# Patient Record
Sex: Female | Born: 1965
Health system: Southern US, Community
[De-identification: ages and names within clinical notes are randomized; demographics above are authoritative.]

## PROBLEM LIST (undated history)

## (undated) DIAGNOSIS — F3289 Other specified depressive episodes: Secondary | ICD-10-CM

## (undated) DIAGNOSIS — E785 Hyperlipidemia, unspecified: Secondary | ICD-10-CM

## (undated) DIAGNOSIS — G43909 Migraine, unspecified, not intractable, without status migrainosus: Secondary | ICD-10-CM

## (undated) DIAGNOSIS — I82409 Acute embolism and thrombosis of unspecified deep veins of unspecified lower extremity: Secondary | ICD-10-CM

## (undated) DIAGNOSIS — G4700 Insomnia, unspecified: Secondary | ICD-10-CM

## (undated) DIAGNOSIS — K219 Gastro-esophageal reflux disease without esophagitis: Secondary | ICD-10-CM

## (undated) DIAGNOSIS — G4733 Obstructive sleep apnea (adult) (pediatric): Secondary | ICD-10-CM

## (undated) DIAGNOSIS — R030 Elevated blood-pressure reading, without diagnosis of hypertension: Secondary | ICD-10-CM

## (undated) DIAGNOSIS — I2699 Other pulmonary embolism without acute cor pulmonale: Secondary | ICD-10-CM

## (undated) DIAGNOSIS — J309 Allergic rhinitis, unspecified: Secondary | ICD-10-CM

## (undated) DIAGNOSIS — G894 Chronic pain syndrome: Secondary | ICD-10-CM

## (undated) DIAGNOSIS — F329 Major depressive disorder, single episode, unspecified: Secondary | ICD-10-CM

## (undated) DIAGNOSIS — K589 Irritable bowel syndrome without diarrhea: Secondary | ICD-10-CM

## (undated) DIAGNOSIS — D6859 Other primary thrombophilia: Secondary | ICD-10-CM

## (undated) HISTORY — DX: Elevated blood-pressure reading, without diagnosis of hypertension: R03.0

## (undated) HISTORY — PX: NASAL SINUS SURGERY: SHX719

## (undated) HISTORY — DX: Allergic rhinitis, unspecified: J30.9

## (undated) HISTORY — DX: Insomnia, unspecified: G47.00

## (undated) HISTORY — DX: Chronic pain syndrome: G89.4

## (undated) HISTORY — PX: OTHER SURGICAL HISTORY: SHX169

## (undated) HISTORY — PX: CARPAL TUNNEL RELEASE: SHX101

## (undated) HISTORY — DX: Other specified depressive episodes: F32.89

## (undated) HISTORY — DX: Migraine, unspecified, not intractable, without status migrainosus: G43.909

## (undated) HISTORY — DX: Other primary thrombophilia: D68.59

## (undated) HISTORY — DX: Major depressive disorder, single episode, unspecified: F32.9

## (undated) HISTORY — DX: Obstructive sleep apnea (adult) (pediatric): G47.33

## (undated) HISTORY — DX: Irritable bowel syndrome, unspecified: K58.9

## (undated) HISTORY — DX: Hyperlipidemia, unspecified: E78.5

## (undated) HISTORY — DX: Gastro-esophageal reflux disease without esophagitis: K21.9

---

## 1998-10-12 ENCOUNTER — Ambulatory Visit (HOSPITAL_BASED_OUTPATIENT_CLINIC_OR_DEPARTMENT_OTHER): Admission: RE | Admit: 1998-10-12 | Discharge: 1998-10-12 | Payer: Self-pay | Admitting: *Deleted

## 1999-01-31 ENCOUNTER — Encounter: Admission: RE | Admit: 1999-01-31 | Discharge: 1999-01-31 | Payer: Self-pay | Admitting: Obstetrics & Gynecology

## 1999-01-31 ENCOUNTER — Encounter: Payer: Self-pay | Admitting: Obstetrics & Gynecology

## 1999-02-02 ENCOUNTER — Other Ambulatory Visit: Admission: RE | Admit: 1999-02-02 | Discharge: 1999-02-02 | Payer: Self-pay | Admitting: Obstetrics & Gynecology

## 1999-02-27 ENCOUNTER — Encounter: Payer: Self-pay | Admitting: Obstetrics & Gynecology

## 1999-02-27 ENCOUNTER — Ambulatory Visit (HOSPITAL_COMMUNITY): Admission: RE | Admit: 1999-02-27 | Discharge: 1999-02-27 | Payer: Self-pay | Admitting: Obstetrics & Gynecology

## 1999-04-29 ENCOUNTER — Inpatient Hospital Stay (HOSPITAL_COMMUNITY): Admission: AD | Admit: 1999-04-29 | Discharge: 1999-04-29 | Payer: Self-pay | Admitting: Obstetrics and Gynecology

## 1999-05-02 ENCOUNTER — Ambulatory Visit (HOSPITAL_COMMUNITY): Admission: RE | Admit: 1999-05-02 | Discharge: 1999-05-02 | Payer: Self-pay | Admitting: Obstetrics and Gynecology

## 1999-05-02 ENCOUNTER — Encounter: Payer: Self-pay | Admitting: Obstetrics and Gynecology

## 1999-05-17 ENCOUNTER — Inpatient Hospital Stay (HOSPITAL_COMMUNITY): Admission: AD | Admit: 1999-05-17 | Discharge: 1999-05-17 | Payer: Self-pay | Admitting: Obstetrics & Gynecology

## 1999-08-30 ENCOUNTER — Ambulatory Visit (HOSPITAL_COMMUNITY): Admission: RE | Admit: 1999-08-30 | Discharge: 1999-08-30 | Payer: Self-pay | Admitting: Obstetrics & Gynecology

## 1999-11-20 ENCOUNTER — Inpatient Hospital Stay (HOSPITAL_COMMUNITY): Admission: AD | Admit: 1999-11-20 | Discharge: 1999-11-23 | Payer: Self-pay | Admitting: Obstetrics and Gynecology

## 1999-11-24 ENCOUNTER — Encounter: Admission: RE | Admit: 1999-11-24 | Discharge: 2000-01-03 | Payer: Self-pay | Admitting: Obstetrics and Gynecology

## 2000-03-26 ENCOUNTER — Other Ambulatory Visit: Admission: RE | Admit: 2000-03-26 | Discharge: 2000-03-26 | Payer: Self-pay | Admitting: Obstetrics & Gynecology

## 2000-12-11 ENCOUNTER — Encounter: Admission: RE | Admit: 2000-12-11 | Discharge: 2000-12-11 | Payer: Self-pay | Admitting: Family Medicine

## 2000-12-11 ENCOUNTER — Encounter: Payer: Self-pay | Admitting: Family Medicine

## 2003-06-09 ENCOUNTER — Encounter (INDEPENDENT_AMBULATORY_CARE_PROVIDER_SITE_OTHER): Payer: Self-pay | Admitting: *Deleted

## 2003-06-09 ENCOUNTER — Encounter: Admission: RE | Admit: 2003-06-09 | Discharge: 2003-06-09 | Payer: Self-pay | Admitting: Family Medicine

## 2006-08-16 ENCOUNTER — Encounter
Admission: RE | Admit: 2006-08-16 | Discharge: 2006-08-16 | Payer: Self-pay | Admitting: Physical Medicine and Rehabilitation

## 2006-10-28 ENCOUNTER — Encounter
Admission: RE | Admit: 2006-10-28 | Discharge: 2006-10-28 | Payer: Self-pay | Admitting: Physical Medicine & Rehabilitation

## 2008-07-21 DIAGNOSIS — R51 Headache: Secondary | ICD-10-CM | POA: Insufficient documentation

## 2008-07-21 DIAGNOSIS — R519 Headache, unspecified: Secondary | ICD-10-CM | POA: Insufficient documentation

## 2008-07-21 DIAGNOSIS — R197 Diarrhea, unspecified: Secondary | ICD-10-CM | POA: Insufficient documentation

## 2008-07-21 DIAGNOSIS — Z86718 Personal history of other venous thrombosis and embolism: Secondary | ICD-10-CM | POA: Insufficient documentation

## 2008-07-21 DIAGNOSIS — M2550 Pain in unspecified joint: Secondary | ICD-10-CM | POA: Insufficient documentation

## 2008-07-21 DIAGNOSIS — K589 Irritable bowel syndrome without diarrhea: Secondary | ICD-10-CM | POA: Insufficient documentation

## 2008-07-21 DIAGNOSIS — M722 Plantar fascial fibromatosis: Secondary | ICD-10-CM | POA: Insufficient documentation

## 2008-07-21 DIAGNOSIS — G4700 Insomnia, unspecified: Secondary | ICD-10-CM

## 2008-07-21 DIAGNOSIS — R5381 Other malaise: Secondary | ICD-10-CM

## 2008-07-21 DIAGNOSIS — K219 Gastro-esophageal reflux disease without esophagitis: Secondary | ICD-10-CM | POA: Insufficient documentation

## 2008-07-21 DIAGNOSIS — M25549 Pain in joints of unspecified hand: Secondary | ICD-10-CM | POA: Insufficient documentation

## 2008-07-21 DIAGNOSIS — IMO0001 Reserved for inherently not codable concepts without codable children: Secondary | ICD-10-CM | POA: Insufficient documentation

## 2008-07-21 DIAGNOSIS — E538 Deficiency of other specified B group vitamins: Secondary | ICD-10-CM

## 2008-07-21 DIAGNOSIS — G5600 Carpal tunnel syndrome, unspecified upper limb: Secondary | ICD-10-CM | POA: Insufficient documentation

## 2008-07-21 DIAGNOSIS — G894 Chronic pain syndrome: Secondary | ICD-10-CM | POA: Insufficient documentation

## 2008-07-21 DIAGNOSIS — D6859 Other primary thrombophilia: Secondary | ICD-10-CM | POA: Insufficient documentation

## 2008-07-21 DIAGNOSIS — R5383 Other fatigue: Secondary | ICD-10-CM

## 2008-07-21 DIAGNOSIS — R030 Elevated blood-pressure reading, without diagnosis of hypertension: Secondary | ICD-10-CM | POA: Insufficient documentation

## 2008-07-21 DIAGNOSIS — G43909 Migraine, unspecified, not intractable, without status migrainosus: Secondary | ICD-10-CM

## 2008-07-21 DIAGNOSIS — F329 Major depressive disorder, single episode, unspecified: Secondary | ICD-10-CM

## 2008-07-21 DIAGNOSIS — G4733 Obstructive sleep apnea (adult) (pediatric): Secondary | ICD-10-CM | POA: Insufficient documentation

## 2008-07-21 DIAGNOSIS — R609 Edema, unspecified: Secondary | ICD-10-CM | POA: Insufficient documentation

## 2008-07-21 DIAGNOSIS — F32A Depression, unspecified: Secondary | ICD-10-CM

## 2008-07-21 DIAGNOSIS — J309 Allergic rhinitis, unspecified: Secondary | ICD-10-CM | POA: Insufficient documentation

## 2008-07-21 DIAGNOSIS — E559 Vitamin D deficiency, unspecified: Secondary | ICD-10-CM

## 2008-07-21 DIAGNOSIS — E785 Hyperlipidemia, unspecified: Secondary | ICD-10-CM

## 2008-07-21 HISTORY — DX: Deficiency of other specified B group vitamins: E53.8

## 2008-07-21 HISTORY — DX: Hyperlipidemia, unspecified: E78.5

## 2008-07-21 HISTORY — DX: Depression, unspecified: F32.A

## 2008-07-21 HISTORY — DX: Other malaise: R53.81

## 2008-07-21 HISTORY — DX: Vitamin D deficiency, unspecified: E55.9

## 2008-07-21 HISTORY — DX: Edema, unspecified: R60.9

## 2008-07-21 HISTORY — DX: Migraine, unspecified, not intractable, without status migrainosus: G43.909

## 2008-07-21 HISTORY — DX: Insomnia, unspecified: G47.00

## 2008-07-21 HISTORY — DX: Personal history of other venous thrombosis and embolism: Z86.718

## 2008-07-22 ENCOUNTER — Ambulatory Visit: Payer: Self-pay | Admitting: Gastroenterology

## 2008-10-20 ENCOUNTER — Encounter: Payer: Self-pay | Admitting: Gastroenterology

## 2008-11-17 ENCOUNTER — Ambulatory Visit: Payer: Self-pay | Admitting: Gastroenterology

## 2010-08-25 NOTE — Op Note (Signed)
[facility]  Patient:    Leslie Rice, Leslie Rice                       MRN: [id number] Proc. Date: 11/20/99 Attending:  Mickle Mallory                           Operative Report  PREOPERATIVE DIAGNOSES:       1. Intrauterine pregnancy at 39 weeks.                               2. Previous cesarean section.                               3. Refused vaginal birth after cesarean.                               4. Group B strep positive.  POSTOPERATIVE DIAGNOSES:      1. Intrauterine pregnancy at 39 weeks.                               2. Previous cesarean section.                               3. Refused vaginal birth after cesarean.                               4. Group B strep positive.  OPERATION:                    Repeat low transverse cesarean section.  SURGEON:                      Marcelle Overlie, M.D.  ASSISTANT:                    Caralyn Guile. Arlyce Dice, M.D.  ANESTHESIA:                   Epidural.  ESTIMATED BLOOD LOSS:         500 cc.  DRAINS:                       Foley.  COMPLICATIONS:                None.  FINDINGS:                     Female infant with Apgars of 9 at one minute and 9 at five minutes weighing 8 pounds 13 ounces.  COMPLICATIONS:                None.  DESCRIPTION OF PROCEDURE:     The patient was taken to the operating room where she was given a spinal for anesthesia without difficulty.  She was then prepped and draped in the usual sterile fashion.  A Foley catheter was also placed in the bladder.  Using a scalpel, a low transverse incision was made at the area of the previous incision and carried down to the fascia using electrocautery.  The  fascia was scored in the midline and extended laterally using Mayo scissors.  A Pfannenstiel incision was then created and the midline was identified and the rectus muscles were separated.  The peritoneum was picked up and entered bluntly.  The peritoneal incision was then  extended superiorly and then inferiorly with good visualization.  The bladder blade was inserted.  The lower uterine segment was identified.  The bladder flap was created sharply and then digitally and the bladder blade was then readjusted. Using a scalpel, a low transverse incision was made and then extended laterally. The amniotic fluid was noted to be clear upon entry into the uterine cavity.  The baby was in the cephalic presentation. It was a female and delivered quite easily with Apgars of 9 at one minute and 9 five minutes.  The cord was clamped and cut and the baby was handed to the awaiting pediatricians. Cord blood was obtained.  The placenta was manually removed and noted to be intact.  The uterus was cleared of all clots and debris.  The uterine incision was closed in a single layer using 0 chromic in a continuous running locked stitch and the peritoneum was closed in a single layer using 0 Vicryl in a continuous running stitch.  The fascia was then closed using 0 Vicryl in a continuous running stitch x 2 starting at each corner and meeting in the midline.  The subcutaneous layer was inspected and noted to be hemostatic.  The skin was then closed with staples. All sponge, needle and instrument counts were correct x 2.  The patient tolerated the procedure well and went to the recovery room in stable condition.  Complications none. DD:  11/20/99 TD:  11/20/99 Job: [id number]

## 2010-08-25 NOTE — Discharge Summary (Signed)
[facility]  Patient:    Leslie Rice, Leslie Rice                       MRN: [id number] Adm. Date:  [id number] Disc. Date: [id number] Attending:  Marcelle Overlie Dictator:   Leilani Able, P.A.                           Discharge Summary  FINAL DIAGNOSES:              1. Intrauterine pregnancy at [redacted] weeks gestation.                               2. History of previous cesarean section, refuses                                  vaginal birth after cesarean section.                               3. Group B strep positive.  PROCEDURES:                   Repeat low transverse cesarean section.  SURGEON:                      Dr. Marcelle Overlie.  ASSISTANT:                    Dr. Ilda Mori.  COMPLICATIONS:                None.  HISTORY OF PRESENT ILLNESS:   This 45 year old G2, P1, was admitted at [redacted] weeks gestation for repeat cesarean section.  The patient had a history of cesarean section in 1998, and refuses a trial of labor with this pregnancy.  HOSPITAL COURSE:              The patient was taken to the operating room by Dr. Marcelle Overlie where a repeat low transverse cesarean section was performed, with the delivery of an 8-pound 13-ounce female infant with Apgars of 9 and 9.  Delivery went without complication.  The patients postoperative course was complicated by some postoperative anemia.  The patient was started on FeSO4 325 mg 1 t.i.d.  The patient was felt ready for discharge on postoperative day #3.  The patient was Rh negative but did not receive RhoGAM because the baby was also Rh negative.  The patient was sent home on a regular diet, told to decrease activities, told to continue FeSO4 325 mg 1 t.i.d., was given Percocet 1-2 every four hours as needed for pain.  She was to follow up in the office on November 24, 1999, for staple removal, and then to follow up in four more weeks for her postoperative visit.DD:  01/12/00 TD:  01/13/00 Job:  [id number]

## 2012-02-29 ENCOUNTER — Ambulatory Visit (INDEPENDENT_AMBULATORY_CARE_PROVIDER_SITE_OTHER): Payer: 59 | Admitting: Internal Medicine

## 2012-02-29 ENCOUNTER — Encounter: Payer: Self-pay | Admitting: Internal Medicine

## 2012-02-29 VITALS — BP 114/76 | HR 85 | Temp 98.5°F | Ht 62.75 in | Wt 228.0 lb

## 2012-02-29 DIAGNOSIS — Z0000 Encounter for general adult medical examination without abnormal findings: Secondary | ICD-10-CM

## 2012-02-29 DIAGNOSIS — G4733 Obstructive sleep apnea (adult) (pediatric): Secondary | ICD-10-CM

## 2012-02-29 DIAGNOSIS — F3289 Other specified depressive episodes: Secondary | ICD-10-CM

## 2012-02-29 DIAGNOSIS — G43909 Migraine, unspecified, not intractable, without status migrainosus: Secondary | ICD-10-CM

## 2012-02-29 DIAGNOSIS — G894 Chronic pain syndrome: Secondary | ICD-10-CM

## 2012-02-29 DIAGNOSIS — K219 Gastro-esophageal reflux disease without esophagitis: Secondary | ICD-10-CM

## 2012-02-29 DIAGNOSIS — D6859 Other primary thrombophilia: Secondary | ICD-10-CM

## 2012-02-29 DIAGNOSIS — E785 Hyperlipidemia, unspecified: Secondary | ICD-10-CM

## 2012-02-29 DIAGNOSIS — F329 Major depressive disorder, single episode, unspecified: Secondary | ICD-10-CM

## 2012-02-29 LAB — POCT INR: INR: 1.3

## 2012-02-29 MED ORDER — SERTRALINE HCL 100 MG PO TABS: 200.0000 mg | ORAL_TABLET | Freq: Every day | ORAL | Status: DC

## 2012-02-29 NOTE — Assessment & Plan Note (Signed)
On Zoloft and Lamictal, reports excellent results to Lamictal. Medication is usually refill by PCP

## 2012-02-29 NOTE — Patient Instructions (Addendum)
Coumadin 5 mg: One tablet every day Next INRt in 2 weeks please make an appointment ----- Please get records from your previous doctor: EKGs, colonoscopies, EGDs, echocardiograms, XRs. Also labs from the last year Immunization records --- Next visit to see me in 2 months

## 2012-02-29 NOTE — Assessment & Plan Note (Signed)
In the past, simvastatin caused pain. She took Pravachol up to a month ago ---> self d/c due to pain (despite taking pain medication)

## 2012-02-29 NOTE — Assessment & Plan Note (Signed)
Used to see Dr Shaune Spittle to see Dr Verlan Friends [phone]

## 2012-02-29 NOTE — Assessment & Plan Note (Signed)
Last INR 6 weeks ago was too high, they decrease her dose. Currently taking 5 mg tablets: 1 a day, half tablet from Tuesday and Thursday (30 mg weekly) Plan: 5 mg every day, 5 mg weekly INR in 2 weeks

## 2012-02-29 NOTE — Assessment & Plan Note (Addendum)
Menstrual related, used to take HRT, now unable to due to clots Used to see Dr Sandria Manly  Currently on Topamax

## 2012-02-29 NOTE — Assessment & Plan Note (Signed)
Reports a EGD and colonoscopy in 2012 [facility]) will try to get records

## 2012-02-29 NOTE — Progress Notes (Signed)
  Subjective:    Patient ID: Leslie Rice, female    DOB: 11-02-1965, 46 y.o.   MRN: [id number]  HPI New patient, reports that I used to see her many years ago. Patient is transferring her primary care from [facility] to here. She likes to keep her specialists over there. In general doing well. Coumadin, needs an INR check Headaches, used to see Dr. Sandria Manly, headaches were related to hormones, HRT worked for her however at this point she is unable to take because her history of clots. High cholesterol, intolerant to medication. See assessment and plan Pain management per Dr. Verlan Friends  Past Medical History  Diagnosis Date  . MIGRAINE HEADACHE   . ALLERGIC RHINITIS   . Irritable bowel syndrome   . GERD   . Primary hypercoagulable state   . OSA (obstructive sleep apnea)   . Chronic pain syndrome   . DEPRESSION   . HYPERLIPIDEMIA   . INSOMNIA   . ELEVATED BLOOD PRESSURE    Past Surgical History  Procedure Date  . C section     x 2   . Carpal tunnel release     B  . Nasal sinus surgery     History   Social History  . Marital Status: Single    Spouse Name: N/A    Number of Children: 2  . Years of Education: N/A   Occupational History  . IT for insurance     Social History Main Topics  . Smoking status: Never Smoker   . Smokeless tobacco: Never Used  . Alcohol Use: Yes  . Drug Use: No  . Sexually Active: Not on file   Other Topics Concern  . Not on file   Social History Narrative  . No narrative on file   Family History  Problem Relation Age of Onset  . Colon cancer      M, aunt, nephew  . Breast cancer Neg Hx   . CAD Father     age? "silent MIs"  . Stroke Neg Hx   . Lung cancer Father   . Diabetes      F, sisters x2  . Thyroid cancer Sister     Review of Systems In general feeling well, good compliance with medications except Pravachol, has developed myalgias.     Objective:   Physical Exam General -- alert, well-developed, and overweight  appearing. No apparent distress.  Neck --no thyromegaly  Lungs -- normal respiratory effort, no intercostal retractions, no accessory muscle use, and normal breath sounds.   Heart-- normal rate, regular rhythm, no murmur, and no gallop.   Extremities-- no pretibial edema bilaterally  Neurologic-- alert & oriented X3 and strength normal in all extremities. Psych-- Cognition and judgment appear intact. Alert and cooperative with normal attention span and concentration.  not anxious appearing and not depressed appearing.      Assessment & Plan:  Today , I spent more than 35 min with the patient, >50% of the time counseling, and going over her complex medical history

## 2012-02-29 NOTE — Assessment & Plan Note (Signed)
+   sleep apnea test ~ 2012 [facility]) Not on a CPAP , apparently there was a problem w/ her  insurance. Plans to discuss further with her pulmonary MD

## 2012-03-02 ENCOUNTER — Encounter: Payer: Self-pay | Admitting: Internal Medicine

## 2012-03-14 ENCOUNTER — Ambulatory Visit (INDEPENDENT_AMBULATORY_CARE_PROVIDER_SITE_OTHER): Payer: 59 | Admitting: *Deleted

## 2012-03-14 DIAGNOSIS — Z86718 Personal history of other venous thrombosis and embolism: Secondary | ICD-10-CM

## 2012-03-14 DIAGNOSIS — Z7901 Long term (current) use of anticoagulants: Secondary | ICD-10-CM

## 2012-03-14 NOTE — Patient Instructions (Addendum)
INR was 1.3 , we increased coumadin to 5 mg qd  35 mg/week. Today INR 1.9 Plan: Increase coumadin 5 mg: 1 tablet  a day, except Tuesday and Friday take 1.5 tablet a day (40 mg /week) Next INR 2 weeks  JP

## 2012-03-28 ENCOUNTER — Ambulatory Visit (INDEPENDENT_AMBULATORY_CARE_PROVIDER_SITE_OTHER): Payer: 59 | Admitting: *Deleted

## 2012-03-28 VITALS — BP 130/84 | HR 66

## 2012-03-28 DIAGNOSIS — Z86718 Personal history of other venous thrombosis and embolism: Secondary | ICD-10-CM

## 2012-03-28 DIAGNOSIS — Z7901 Long term (current) use of anticoagulants: Secondary | ICD-10-CM

## 2012-03-28 LAB — POCT INR: INR: 3.4

## 2012-03-28 NOTE — Patient Instructions (Addendum)
Current dose: coumadin 5 mg: 1 tablet a day, except Tuesday and Friday take 1.5 tablet a day (40 mg /week) INR today 3.4. Plan: Decrease Coumadin  5 mg to: 1 tablet  daily, 1.5 tablet on Wednesdays (35 mg/week) Next INR in 3 weeks JP

## 2012-04-03 ENCOUNTER — Telehealth: Payer: Self-pay | Admitting: Internal Medicine

## 2012-04-03 NOTE — Telephone Encounter (Signed)
I review more than 200 pages of old records dating 2008 and on. Many will be scanned, the rest will be return to the patient for safe keeping. 02/15/2010: Had a colonoscopy and EGD, small bowel intestine biopsy negative, colon biopsy negative.  Next colonoscopy 2016 given family history. 09/05/2011 Mammogram negative 09/21/2011: Potassium 4.5, creatinine 0.8, LFTs normal. CBC normal with a hemoglobin of 12.6 and platelets of 163. Total cholesterol 286, HDL 39, LDL 208 Pap smear normal 11/21/2011:  Total cholesterol 280, triglycerides 457, HDL 57, LDL 119. ALT normal

## 2012-04-28 ENCOUNTER — Telehealth: Payer: Self-pay | Admitting: Internal Medicine

## 2012-04-28 NOTE — Telephone Encounter (Signed)
Called pt she advised she will be in on Friday 1.24.14 for her Follow up visit and would like to have her PT/INR done at this time. I have added to pts appt notes tks

## 2012-04-28 NOTE — Telephone Encounter (Signed)
Patient is due for an coumadin-INR visit. Please arrange

## 2012-04-28 NOTE — Telephone Encounter (Signed)
ok 

## 2012-04-30 ENCOUNTER — Ambulatory Visit: Payer: 59 | Admitting: Internal Medicine

## 2012-05-02 ENCOUNTER — Encounter: Payer: Self-pay | Admitting: Internal Medicine

## 2012-05-02 ENCOUNTER — Ambulatory Visit (INDEPENDENT_AMBULATORY_CARE_PROVIDER_SITE_OTHER): Payer: 59 | Admitting: Internal Medicine

## 2012-05-02 VITALS — BP 120/84 | HR 70 | Temp 98.0°F | Wt 227.0 lb

## 2012-05-02 DIAGNOSIS — F3289 Other specified depressive episodes: Secondary | ICD-10-CM

## 2012-05-02 DIAGNOSIS — D6859 Other primary thrombophilia: Secondary | ICD-10-CM

## 2012-05-02 DIAGNOSIS — G894 Chronic pain syndrome: Secondary | ICD-10-CM

## 2012-05-02 DIAGNOSIS — G43909 Migraine, unspecified, not intractable, without status migrainosus: Secondary | ICD-10-CM

## 2012-05-02 DIAGNOSIS — G4733 Obstructive sleep apnea (adult) (pediatric): Secondary | ICD-10-CM

## 2012-05-02 DIAGNOSIS — F329 Major depressive disorder, single episode, unspecified: Secondary | ICD-10-CM

## 2012-05-02 DIAGNOSIS — Z7901 Long term (current) use of anticoagulants: Secondary | ICD-10-CM

## 2012-05-02 LAB — POCT INR: INR: 1.7

## 2012-05-02 MED ORDER — PROMETHAZINE HCL 25 MG PO TABS: 25.0000 mg | ORAL_TABLET | Freq: Four times a day (QID) | ORAL | Status: DC | PRN

## 2012-05-02 NOTE — Assessment & Plan Note (Signed)
Well-controlled, only one episode since the last time she was here. Requests a refill of Phenergan, done

## 2012-05-02 NOTE — Assessment & Plan Note (Signed)
Patient go back to Dr. Ethelene Hal, Dr. Verlan Friends office was too far. Pain management will remain in the hand of one of the 2 physicians mentioned above

## 2012-05-02 NOTE — Assessment & Plan Note (Addendum)
Has not seen pulmonary yet, again complained of fatigue and wonders what to do about it.  My advice was to get treated for sleep apnea, if she continue with fatigue after using a CPAP, further investigation may be needed.

## 2012-05-02 NOTE — Progress Notes (Signed)
  Subjective:    Patient ID: Leslie Rice, female    DOB: 1966-01-17, 47 y.o.   MRN: [id number]  HPI Followup from previous visit Since the last time she was here, she is taking Coumadin as prescribed. Depression continue to be well-controlled. Sleep apnea continue to be untreated.  Past Medical History  Diagnosis Date  . MIGRAINE HEADACHE   . ALLERGIC RHINITIS   . Irritable bowel syndrome   . GERD   . Primary hypercoagulable state   . OSA (obstructive sleep apnea)   . Chronic pain syndrome   . DEPRESSION   . HYPERLIPIDEMIA   . INSOMNIA   . ELEVATED BLOOD PRESSURE    Past Surgical History  Procedure Date  . C section     x 2   . Carpal tunnel release     B  . Nasal sinus surgery      Review of Systems Headaches are well-controlled, only one time needed some treatment. Request a refill on Phenergan. Continue generalized fatigue. As far as pain management, and she is switching back to Dr. Ethelene Hal, Dr. Verlan Friends   office was too far.    Objective:   Physical Exam General -- alert, well-developed Lungs -- normal respiratory effort, no intercostal retractions, no accessory muscle use, and normal breath sounds.   Heart-- normal rate, regular rhythm, no murmur, and no gallop.   Abdomen--soft, non-tender, no distention, no masses, no HSM, no guarding, and no rigidity.   Extremities-- no pretibial edema bilaterally, valves measured and symmetric Psych-- Cognition and judgment appear intact. Alert and cooperative with normal attention span and concentration.  not anxious appearing and not depressed appearing.       Assessment & Plan:

## 2012-05-02 NOTE — Patient Instructions (Addendum)
New Coumadin: 1 tablet a day, except Tuesday and Friday take 1.5 tablet a day (40 mg /week) Next Coumadin check, 3 weeks  ---- Next office visit in 3 months for a physical, fasting.

## 2012-05-02 NOTE — Assessment & Plan Note (Addendum)
Current Coumadin 5 mg daily, 7.5 mg Wednesday.(37.5 /week) INR today 1.7. When she was taking 40 mg a week, INR was 3.4. She has a narrow therapeutic window. Plan: We'll go back to 40 mg weekly ( 1 tablet a day, except Tuesday and Friday take 1.5 tablet a day ), check INR in 3 weeks.

## 2012-05-02 NOTE — Assessment & Plan Note (Signed)
Symptoms well controlled 

## 2012-05-23 ENCOUNTER — Ambulatory Visit: Payer: 59

## 2012-06-01 ENCOUNTER — Telehealth: Payer: Self-pay | Admitting: Internal Medicine

## 2012-06-01 NOTE — Telephone Encounter (Signed)
Please schedule a INR appointment, her original appointment was cancelled due to weather

## 2012-06-02 NOTE — Telephone Encounter (Signed)
It is about time, please schedule a visit this week

## 2012-06-02 NOTE — Telephone Encounter (Signed)
Pt stopped coumadin per Dr. Ethelene Hal to she could have a cortisone shot. She has only been back on it since 05/22/12 and would like to know when she should come back for another pt check.

## 2012-06-03 NOTE — Telephone Encounter (Signed)
Pt scheduled for this Thursday, 2/27.

## 2012-06-05 ENCOUNTER — Ambulatory Visit: Payer: 59

## 2012-08-10 ENCOUNTER — Other Ambulatory Visit: Payer: Self-pay | Admitting: Internal Medicine

## 2012-08-11 NOTE — Telephone Encounter (Signed)
Refill done.  

## 2012-09-17 ENCOUNTER — Ambulatory Visit (INDEPENDENT_AMBULATORY_CARE_PROVIDER_SITE_OTHER): Payer: 59 | Admitting: Pharmacist Clinician (PhC)/ Clinical Pharmacy Specialist

## 2012-09-17 DIAGNOSIS — Z7901 Long term (current) use of anticoagulants: Secondary | ICD-10-CM

## 2012-09-17 DIAGNOSIS — Z86718 Personal history of other venous thrombosis and embolism: Secondary | ICD-10-CM

## 2012-09-17 LAB — POCT INR: INR: 1

## 2012-10-25 ENCOUNTER — Other Ambulatory Visit: Payer: Self-pay | Admitting: Internal Medicine

## 2012-10-30 ENCOUNTER — Other Ambulatory Visit: Payer: Self-pay | Admitting: Internal Medicine

## 2012-10-30 NOTE — Telephone Encounter (Signed)
Ok to refill?  Last OV 1.24.14 Last filled 5.4.14 for #30 with 1 RF. Pt.  Has been requested to schedule OV on 7.19.14. No future appointment currently scheduled.

## 2012-10-31 ENCOUNTER — Other Ambulatory Visit: Payer: Self-pay | Admitting: *Deleted

## 2012-10-31 MED ORDER — LAMOTRIGINE 200 MG PO TABS: ORAL_TABLET | ORAL | Status: DC

## 2012-10-31 NOTE — Telephone Encounter (Signed)
Refill request denied per orders Dr. Drue Novel. Lmovm to schedule OV as well as mailed patient a letter asking to schedule appointment.

## 2012-10-31 NOTE — Telephone Encounter (Signed)
Pt. Called back and scheduled appt. For 11/17/12 at 0900 with Dr. Drue Novel. Original lamictal refill denied. Spoke with Dr. Drue Novel, ok to refill for one month no refills. Orders enacted. Pt. Updated on this information as well.

## 2012-10-31 NOTE — Telephone Encounter (Signed)
Denied, needs OV 

## 2012-11-06 ENCOUNTER — Telehealth: Payer: Self-pay | Admitting: Internal Medicine

## 2012-11-06 NOTE — Telephone Encounter (Signed)
Ok to refill coumadin? Last OV 1.24.14 Only anti-coag visit documented in epic this year was 6.11.14 at [facility]. Has future OV 8.11.14 Last filled 11.22.13 by Historical Provider  Ok to refill atenolol?  Last filled by Historical provider 11.22.13

## 2012-11-06 NOTE — Telephone Encounter (Signed)
Refill to be addressed by [facility]

## 2012-11-06 NOTE — Telephone Encounter (Signed)
Spoke with patient, she states that Dr. Drue Novel has not filled the atenolol nor coumadin since her transfer to our practice from Dr Leonia Corona. She also stated that she has not had her INR checked regularly due to her job situation. The last INR check at [facility] was after a spinal injection that they are not managing her coumadin. Relayed this information to Dr. Drue Novel. Verbal order received ok to refill atenolol for 3 months. And coumadin for 10 day supply until patient can contact a coumadin clinic of her choice to manage her coumadin from now on.  Pt. Was not happy with this information but verbalized understanding. Pt states [facility] has a coumadin clinic near her office and she would contact them. Pt. Encouraged to keep scheduled appointment on 11/17/12. Spoke with patient regarding coumadin dosing. Pt. States she is taking 5 mg daily except 7.5 mg on Monday and Thursday. Verbal orders received from Dr. Drue Novel ok to update sig to this information per patient and only give 13 tab (10 day supply).

## 2012-11-07 NOTE — Telephone Encounter (Signed)
Few weeks ago our  office started to transfer our coumadin patients to coumadin clinics

## 2012-11-17 ENCOUNTER — Ambulatory Visit (INDEPENDENT_AMBULATORY_CARE_PROVIDER_SITE_OTHER): Payer: 59 | Admitting: Internal Medicine

## 2012-11-17 ENCOUNTER — Encounter: Payer: Self-pay | Admitting: Internal Medicine

## 2012-11-17 VITALS — BP 135/98 | HR 79 | Temp 98.3°F | Wt 234.0 lb

## 2012-11-17 DIAGNOSIS — E785 Hyperlipidemia, unspecified: Secondary | ICD-10-CM

## 2012-11-17 DIAGNOSIS — Z0000 Encounter for general adult medical examination without abnormal findings: Secondary | ICD-10-CM

## 2012-11-17 DIAGNOSIS — F3289 Other specified depressive episodes: Secondary | ICD-10-CM

## 2012-11-17 DIAGNOSIS — D6859 Other primary thrombophilia: Secondary | ICD-10-CM

## 2012-11-17 DIAGNOSIS — R030 Elevated blood-pressure reading, without diagnosis of hypertension: Secondary | ICD-10-CM

## 2012-11-17 DIAGNOSIS — F329 Major depressive disorder, single episode, unspecified: Secondary | ICD-10-CM

## 2012-11-17 DIAGNOSIS — Z7901 Long term (current) use of anticoagulants: Secondary | ICD-10-CM

## 2012-11-17 MED ORDER — LAMOTRIGINE 200 MG PO TABS: ORAL_TABLET | ORAL | Status: DC

## 2012-11-17 MED ORDER — SERTRALINE HCL 100 MG PO TABS: ORAL_TABLET | ORAL | Status: DC

## 2012-11-17 MED ORDER — ATENOLOL 50 MG PO TABS: ORAL_TABLET | ORAL | Status: DC

## 2012-11-17 MED ORDER — WARFARIN SODIUM 5 MG PO TABS: ORAL_TABLET | ORAL | Status: DC

## 2012-11-17 NOTE — Assessment & Plan Note (Signed)
In the past, simvastatin caused pain. She took Pravachol up to a month ago ---> self d/c due to pain (despite taking pain medication) Last cholesterol panel 45409: Cholesterol 280, triglycerides were4 57, LDL 119. Plan: Appropriate diet and exercise discussed, recheck on return to the office in 4 months.

## 2012-11-17 NOTE — Patient Instructions (Addendum)
Current dose Coumadin 5 mg daily, 7.5 mg Monday and Thursday INR today 2.7 No change,  next check in 2 weeks ---- Check the  blood pressure 2 or 3 times a week, be sure it is between 110/60 and 140/85. If it is consistently higher or lower, let me know --- Next visit fasting in 4 months

## 2012-11-17 NOTE — Assessment & Plan Note (Signed)
On Atenolol, BP slightly elevated today, Recommend self-monitoring.

## 2012-11-17 NOTE — Assessment & Plan Note (Addendum)
Reports symptoms are currently well controlled with Lamictal Zoloft excepr for last few days (sister dx w/ cancer). Previously seen at Dr. Patsy Lager office but the office is too far. I advised the patient I'll refill her medications, followup in 4 months

## 2012-11-17 NOTE — Progress Notes (Signed)
  Subjective:    Patient ID: Leslie Rice, female    DOB: 04/28/1965, 47 y.o.   MRN: [id number]  HPI Follow up Anticoagulation, had to stop to Coumadin temporarily for a local injection, now is back on Coumadin taking 5 mg daily except Monday and Thursdays take 7.5 mg. Sleep apnea, still not using a CPAP. Depression, symptoms currently well controlled with Lamictal and Zoloft.  Past Medical History  Diagnosis Date  . MIGRAINE HEADACHE   . ALLERGIC RHINITIS   . Irritable bowel syndrome   . GERD   . Primary hypercoagulable state   . OSA (obstructive sleep apnea)   . Chronic pain syndrome   . DEPRESSION   . HYPERLIPIDEMIA   . INSOMNIA   . ELEVATED BLOOD PRESSURE   . Birth control     not sexually active at this point    Past Surgical History  Procedure Laterality Date  . C section      x 2   . Carpal tunnel release      B  . Nasal sinus surgery     History   Social History  . Marital Status: Single    Spouse Name: N/A    Number of Children: 2  . Years of Education: N/A   Occupational History  . IT for insurance     Social History Main Topics  . Smoking status: Never Smoker   . Smokeless tobacco: Never Used  . Alcohol Use: Yes  . Drug Use: No  . Sexually Active: Not on file   Other Topics Concern  . Not on file   Social History Narrative  . No narrative on file      Review of Systems No chest pain, shortness or breath. No lower extremity edema. No ambulatory BPs.     Objective:   Physical Exam BP 135/98  Pulse 79  Temp(Src) 98.3 F (36.8 C) (Oral)  Wt 234 lb (106.142 kg)  BMI 41.77 kg/m2  SpO2 95%  General -- alert, well-developed,NAD .   Lungs -- normal respiratory effort, no intercostal retractions, no accessory muscle use, and normal breath sounds.   Heart-- normal rate, regular rhythm, no murmur, and no gallop.   Extremities-- no pretibial edema bilaterally, calves symmetic  Psych-- Cognition and judgment appear intact. Alert and  cooperative with normal attention span and concentration.  not anxious appearing and not depressed appearing.         Assessment & Plan:

## 2012-11-17 NOTE — Addendum Note (Signed)
Addended by: Shirlee More I on: 11/17/2012 03:46 PM   Modules accepted: Orders

## 2012-11-17 NOTE — Assessment & Plan Note (Signed)
Current dose Coumadin 5 mg daily, 7.5 mg Monday and Thursday INR today 2.7 No change, referrd to the Coumadin clinic in [facility], next check in 2 weeks

## 2012-11-17 NOTE — Assessment & Plan Note (Addendum)
Sister age 45 just dx w/ colon cancer stage IV Mother and aunt had colon ca age dx in their 72s Pt had a cscope at age 10, normal. rec to call GI to see when is her next cscope

## 2012-11-19 ENCOUNTER — Telehealth: Payer: Self-pay | Admitting: *Deleted

## 2012-11-19 NOTE — Telephone Encounter (Signed)
At office visit on 11/17/12 with Dr. Drue Novel, Pt. Requested refill on Zoloft rx. Pt. Stated at OV that she was no longer taking 200 mg or 2 tablets daily but had decreased to 1.5 tablets daily. Dr. Drue Novel gave verbal as well as written order on patient recommendation report to refill Zoloft 100 mg tablet with new sig "1.5 tab QD" refill for 5 months. Orders enacted.

## 2012-11-27 ENCOUNTER — Other Ambulatory Visit: Payer: Self-pay | Admitting: Internal Medicine

## 2012-11-27 NOTE — Telephone Encounter (Signed)
Spoke with pharmacy. Refills left on file. Request sent in error.

## 2012-12-03 ENCOUNTER — Telehealth: Payer: Self-pay | Admitting: Internal Medicine

## 2012-12-03 NOTE — Telephone Encounter (Signed)
Last 3 inr + coumadin doses faxed to [facility].

## 2012-12-03 NOTE — Telephone Encounter (Signed)
[facility] is calling to request previous INR readings on this patient that was referred to them from our office. Fax#: [phone]

## 2012-12-04 ENCOUNTER — Other Ambulatory Visit: Payer: Self-pay | Admitting: Internal Medicine

## 2012-12-04 NOTE — Telephone Encounter (Signed)
rx refilled per protocol  

## 2012-12-16 ENCOUNTER — Other Ambulatory Visit: Payer: Self-pay | Admitting: Internal Medicine

## 2012-12-16 NOTE — Telephone Encounter (Signed)
rx refill- phenergan  Last OV- 11/17/12 No future OV scheduled  Last refilled - 05/02/12 #30 / 0 refills  No UDS contract on file.  Please advise.DJR

## 2012-12-21 ENCOUNTER — Other Ambulatory Visit: Payer: Self-pay | Admitting: Internal Medicine

## 2012-12-23 ENCOUNTER — Telehealth: Payer: Self-pay | Admitting: *Deleted

## 2012-12-23 DIAGNOSIS — R112 Nausea with vomiting, unspecified: Secondary | ICD-10-CM

## 2012-12-23 MED ORDER — PROMETHAZINE HCL 25 MG PO TABS: 25.0000 mg | ORAL_TABLET | Freq: Four times a day (QID) | ORAL | Status: DC | PRN

## 2012-12-23 NOTE — Telephone Encounter (Signed)
Patient is requesting refill on Phenegran Last OV 11/17/12 Last filled 05/02/12 #30 no additional refills No contract or UDS on file Okay to refill?

## 2012-12-23 NOTE — Telephone Encounter (Signed)
Refill x1 

## 2012-12-23 NOTE — Telephone Encounter (Signed)
Refill for phenegran sent to [facility], pt notified via voice mail on cell phone.

## 2013-01-05 ENCOUNTER — Other Ambulatory Visit: Payer: Self-pay | Admitting: Internal Medicine

## 2013-01-05 DIAGNOSIS — F32A Depression, unspecified: Secondary | ICD-10-CM

## 2013-01-05 DIAGNOSIS — F329 Major depressive disorder, single episode, unspecified: Secondary | ICD-10-CM

## 2013-01-05 NOTE — Telephone Encounter (Signed)
Zoloft refill sent to [facility]

## 2013-01-26 ENCOUNTER — Ambulatory Visit (INDEPENDENT_AMBULATORY_CARE_PROVIDER_SITE_OTHER): Payer: 59 | Admitting: Family

## 2013-01-26 ENCOUNTER — Encounter: Payer: Self-pay | Admitting: Family

## 2013-01-26 VITALS — BP 124/90 | HR 86 | Temp 98.0°F | Resp 18 | Ht 62.75 in | Wt 225.0 lb

## 2013-01-26 DIAGNOSIS — G43909 Migraine, unspecified, not intractable, without status migrainosus: Secondary | ICD-10-CM

## 2013-01-26 MED ORDER — SUMATRIPTAN SUCCINATE 50 MG PO TABS: ORAL_TABLET | ORAL | Status: DC

## 2013-01-26 MED ORDER — PROMETHAZINE HCL 25 MG/ML IJ SOLN
25.0000 mg | Freq: Once | INTRAMUSCULAR | Status: AC
  Administered 2013-01-26: 25 mg via INTRAMUSCULAR

## 2013-01-26 MED ORDER — KETOROLAC TROMETHAMINE 30 MG/ML IJ SOLN
60.0000 mg | Freq: Once | INTRAMUSCULAR | Status: AC
  Administered 2013-01-26: 60 mg via INTRAMUSCULAR

## 2013-01-26 NOTE — Patient Instructions (Signed)
Please call if symptoms worsen or if symptoms do not improve. 

## 2013-01-26 NOTE — Assessment & Plan Note (Addendum)
Deteriorated. Will give IM toradol and IM phenergan today in office. She will pick up imitrex to take prn.  We did discuss rare risk of serotonin syndrome with concomitant use of ssri- she verbalizes understanding.

## 2013-01-26 NOTE — Progress Notes (Signed)
Subjective:    Patient ID: Leslie Rice, female    DOB: 1966/03/24, 47 y.o.   MRN: [id number]  HPI  Leslie Rice is a 47 yr old female who presents today with chief complaint of headache.  Started Friday as regular HA, continued Saturday.  Sunday- full blown migraine.  + vomiting, can't function, + photophobia, + phonophobia.  She reports that she has menstrual migraines.  Has used phenergan in the past which has helped her migraine. Though took one early this AM without relief.  Uses pain medication for her back.  Pt reports that recently she has been having migraines about once a month   Review of Systems See HPI  Past Medical History  Diagnosis Date  . MIGRAINE HEADACHE   . ALLERGIC RHINITIS   . Irritable bowel syndrome   . GERD   . Primary hypercoagulable state   . OSA (obstructive sleep apnea)   . Chronic pain syndrome   . DEPRESSION   . HYPERLIPIDEMIA   . INSOMNIA   . ELEVATED BLOOD PRESSURE   . Birth control     not sexually active at this point     History   Social History  . Marital Status: Single    Spouse Name: N/A    Number of Children: 2  . Years of Education: N/A   Occupational History  . IT for insurance     Social History Main Topics  . Smoking status: Never Smoker   . Smokeless tobacco: Never Used  . Alcohol Use: Yes  . Drug Use: No  . Sexual Activity: Not on file   Other Topics Concern  . Not on file   Social History Narrative  . No narrative on file    Past Surgical History  Procedure Laterality Date  . C section      x 2   . Carpal tunnel release      B  . Nasal sinus surgery      Family History  Problem Relation Age of Onset  . Colon cancer      M, aunt, nephew  . Breast cancer Neg Hx   . CAD Father     age? "silent MIs"  . Stroke Neg Hx   . Lung cancer Father   . Diabetes      F, sisters x2  . Thyroid cancer Sister     Allergies  Allergen Reactions  . Ciprofloxacin     REACTION: vomiting  . Codeine     REACTION:  vomiting  . Sulfonamide Derivatives     hives    Current Outpatient Prescriptions on File Prior to Visit  Medication Sig Dispense Refill  . atenolol (TENORMIN) 50 MG tablet TAKE 1 TABLET DAILY.  90 tablet  1  . dicyclomine (BENTYL) 20 MG tablet Take 20 mg by mouth every 6 (six) hours.      Marland Kitchen HYDROcodone-acetaminophen (NORCO) 10-325 MG per tablet Take 1 tablet by mouth every 6 (six) hours as needed.      . lamoTRIgine (LAMICTAL) 200 MG tablet TAKE 1 TABLET DAILY.  30 tablet  5  . oxymorphone (OPANA ER) 40 MG 12 hr tablet Take 40 mg by mouth every 12 (twelve) hours.      . promethazine (PHENERGAN) 25 MG tablet Take 1 tablet (25 mg total) by mouth every 6 (six) hours as needed.  30 tablet  0  . sertraline (ZOLOFT) 100 MG tablet TAKE 2 TABLETS (200 MG TOTAL) BY MOUTH DAILY.  60  tablet  2  . warfarin (COUMADIN) 5 MG tablet 5 mg daily except 7.5 mg on Monday and Thursday  30 tablet  0   No current facility-administered medications on file prior to visit.    BP 124/90  Pulse 86  Temp(Src) 98 F (36.7 C) (Oral)  Resp 18  Ht 5' 2.75" (1.594 m)  Wt 225 lb (102.059 kg)  BMI 40.17 kg/m2  SpO2 97%       Objective:   Physical Exam  Constitutional: She is oriented to person, place, and time. She appears well-developed and well-nourished.  Uncomfortable appearing white female laying in dark exam room  HENT:  Head: Normocephalic and atraumatic.  Eyes: EOM are normal. Pupils are equal, round, and reactive to light.  Cardiovascular: Normal rate and regular rhythm.   No murmur heard. Pulmonary/Chest: Effort normal and breath sounds normal. No respiratory distress. She has no wheezes. She has no rales. She exhibits no tenderness.  Neurological: She is alert and oriented to person, place, and time.  Psychiatric: She has a normal mood and affect. Her behavior is normal.          Assessment & Plan:

## 2013-02-18 ENCOUNTER — Ambulatory Visit (INDEPENDENT_AMBULATORY_CARE_PROVIDER_SITE_OTHER): Payer: 59 | Admitting: Pharmacist Clinician (PhC)/ Clinical Pharmacy Specialist

## 2013-02-18 DIAGNOSIS — Z86718 Personal history of other venous thrombosis and embolism: Secondary | ICD-10-CM

## 2013-02-18 DIAGNOSIS — Z7901 Long term (current) use of anticoagulants: Secondary | ICD-10-CM

## 2013-04-27 ENCOUNTER — Other Ambulatory Visit: Payer: Self-pay | Admitting: Internal Medicine

## 2013-04-27 NOTE — Telephone Encounter (Signed)
Sertraline refilled per protocol. JG//CMA 

## 2013-04-28 ENCOUNTER — Other Ambulatory Visit: Payer: Self-pay | Admitting: Internal Medicine

## 2013-05-26 ENCOUNTER — Other Ambulatory Visit: Payer: Self-pay | Admitting: Internal Medicine

## 2013-07-06 ENCOUNTER — Telehealth: Payer: Self-pay | Admitting: Internal Medicine

## 2013-07-06 DIAGNOSIS — G43909 Migraine, unspecified, not intractable, without status migrainosus: Secondary | ICD-10-CM

## 2013-07-06 DIAGNOSIS — F411 Generalized anxiety disorder: Secondary | ICD-10-CM

## 2013-07-06 MED ORDER — ALPRAZOLAM 0.5 MG PO TABS: ORAL_TABLET | ORAL | Status: DC

## 2013-07-06 MED ORDER — LAMOTRIGINE 200 MG PO TABS: ORAL_TABLET | ORAL | Status: DC

## 2013-07-06 MED ORDER — SUMATRIPTAN SUCCINATE 50 MG PO TABS: ORAL_TABLET | ORAL | Status: DC

## 2013-07-06 NOTE — Telephone Encounter (Signed)
Spoke with patient who states that she is going on a cruise and is out of some medications.  Needs refill on Imitrex and Lamictal but is also requesting anxiety med for travel. Patient has self decreased Zoloft to 100 mg per day due to feeling sedated. She decreased approximately one month ago. States that she is more anxious but is losing weight and doesn't feel as sedated. Patient is due for an appointment for follow up which I have scheduled for when she returns from her vacation.  Please advise.

## 2013-07-06 NOTE — Telephone Encounter (Signed)
Rx sent to [facility] [address] [place] Patient notified

## 2013-07-06 NOTE — Telephone Encounter (Signed)
07/06/2013  Pt has questions about medications.  Please contact to advise.

## 2013-07-06 NOTE — Telephone Encounter (Signed)
Okay imitrex RF #10 and 2 refills Okay RF Lamictal for 6 months Also okay to prescribe alprazolam 0.5 mg ----> 0.5-1 tablet twice a day as needed for anxiety #30. Needs  office visit at her earliest convenience

## 2013-07-20 ENCOUNTER — Encounter: Payer: Self-pay | Admitting: Lab

## 2013-07-20 ENCOUNTER — Encounter: Payer: Self-pay | Admitting: Internal Medicine

## 2013-07-20 ENCOUNTER — Ambulatory Visit (INDEPENDENT_AMBULATORY_CARE_PROVIDER_SITE_OTHER): Payer: 59 | Admitting: Internal Medicine

## 2013-07-20 VITALS — BP 134/85 | HR 73 | Temp 97.9°F | Wt 226.0 lb

## 2013-07-20 DIAGNOSIS — F329 Major depressive disorder, single episode, unspecified: Secondary | ICD-10-CM

## 2013-07-20 DIAGNOSIS — G43909 Migraine, unspecified, not intractable, without status migrainosus: Secondary | ICD-10-CM

## 2013-07-20 DIAGNOSIS — G894 Chronic pain syndrome: Secondary | ICD-10-CM

## 2013-07-20 DIAGNOSIS — R609 Edema, unspecified: Secondary | ICD-10-CM

## 2013-07-20 DIAGNOSIS — F3289 Other specified depressive episodes: Secondary | ICD-10-CM

## 2013-07-20 DIAGNOSIS — D6859 Other primary thrombophilia: Secondary | ICD-10-CM

## 2013-07-20 MED ORDER — ETHACRYNIC ACID 25 MG PO TABS: 25.0000 mg | ORAL_TABLET | Freq: Every day | ORAL | Status: DC

## 2013-07-20 MED ORDER — SERTRALINE HCL 50 MG PO TABS: 50.0000 mg | ORAL_TABLET | Freq: Every day | ORAL | Status: DC

## 2013-07-20 NOTE — Assessment & Plan Note (Addendum)
Has been on Lamictal and Zoloft for a while, since her divorce Patient feels that Zoloft made her too "flat" it she self decreased dose from 200----> 100 mg; initially was a little tearful but is getting better. We agreed to decreased Zoloft to 75 mg, then 50 mg by mouth Likes to continue on Lamictal for now. We'll reassess him return to the office

## 2013-07-20 NOTE — Assessment & Plan Note (Signed)
Likes to stop or decrease beta blockers dose because unable to get her target HR when she exercises. Plan: Decrease atenolol to half tablet daily

## 2013-07-20 NOTE — Progress Notes (Signed)
Subjective:    Patient ID: Leslie Rice, female    DOB: 10-10-1965, 48 y.o.   MRN: [id number]  DOS:  07/20/2013 Type of  visit: Routine office visit Depression, symptoms well controlled, has decreased Zoloft dose, see assessment and plan Migraines, typically takes atenolol but here lately has been skipping because she is unable to get to her target heart rate when she exercises. Coumadin, per the [facility]. INR was low 2 weeks ago they are adjusting her Coumadin dose. She just came back from a trip to [place], When she came back, had both  legs swollen. She admits to eating more salt than usual.   ROS Denies calf pain. No chest pain, difficulty breathing or palpitations. Denies nausea, vomiting, diarrhea. No blood in the stools. When she decreased her Zoloft dose, she got tearful but now is getting better. No suicidal ideas.  Past Medical History  Diagnosis Date  . MIGRAINE HEADACHE   . ALLERGIC RHINITIS   . Irritable bowel syndrome   . GERD   . Primary hypercoagulable state   . OSA (obstructive sleep apnea)   . Chronic pain syndrome   . DEPRESSION   . HYPERLIPIDEMIA   . INSOMNIA   . ELEVATED BLOOD PRESSURE   . Birth control     not sexually active at this point     Past Surgical History  Procedure Laterality Date  . C section      x 2   . Carpal tunnel release      B  . Nasal sinus surgery      History   Social History  . Marital Status: Single    Spouse Name: N/A    Number of Children: 2  . Years of Education: N/A   Occupational History  . IT for insurance     Social History Main Topics  . Smoking status: Never Smoker   . Smokeless tobacco: Never Used  . Alcohol Use: Yes  . Drug Use: No  . Sexual Activity: Not on file   Other Topics Concern  . Not on file   Social History Narrative   2 daughters children live w/ her         Medication List       This list is accurate as of: 07/20/13  5:48 PM.  Always use your most recent  med list.               ALPRAZolam 0.5 MG tablet  Commonly known as:  XANAX  Take 0.5mg  to 1mg  twice daily as needed.     atenolol 50 MG tablet  Commonly known as:  TENORMIN  Take 25 mg by mouth daily. For migraines     ethacrynic acid 25 MG tablet  Commonly known as:  EDECRIN  Take 1 tablet (25 mg total) by mouth daily.     HYDROcodone-acetaminophen 10-325 MG per tablet  Commonly known as:  NORCO  Take 1 tablet by mouth every 6 (six) hours as needed.     lamoTRIgine 200 MG tablet  Commonly known as:  LAMICTAL  Take 1 tablet daily     promethazine 25 MG tablet  Commonly known as:  PHENERGAN  Take 1 tablet (25 mg total) by mouth every 6 (six) hours as needed.     sertraline 50 MG tablet  Commonly known as:  ZOLOFT  Take 1 tablet (50 mg total) by mouth daily.     SUMAtriptan 50 MG tablet  Commonly known as:  IMITREX  One tablet by mouth at start of migraine.  May repeat in 2 hours if no improvement x 1 dose.     warfarin 5 MG tablet  Commonly known as:  COUMADIN  - 5 mg daily except 7.5 mg on Monday and Thursday  - F/u @@ [facility]           Objective:   Physical Exam BP 134/85  Pulse 73  Temp(Src) 97.9 F (36.6 C)  Wt 226 lb (102.513 kg)  SpO2 96%  General -- alert, well-developed, NAD.  HEENT-- Not pale.  Lungs -- normal respiratory effort, no intercostal retractions, no accessory muscle use, and normal breath sounds.  Heart-- normal rate, regular rhythm, no murmur.  Extremities-- +pitting edema at feet-ankle R>L, calves symmetric, no tender  Neurologic--  alert & oriented X3. Speech normal, gait normal, strength normal in all extremities.  Psych-- Cognition and judgment appear intact. Cooperative with normal attention span and concentration. No anxious or depressed appearing.      Assessment & Plan:     '

## 2013-07-20 NOTE — Patient Instructions (Signed)
Get your blood work before you leave   Take Edecrin one tablet in the morning for one week to help with swelling. If the swelling is not resolved within a week or if you have increased swelling or pain in the calf let us know  Decreased Zoloft to 75 mg a day for one month, then 50 mg a day for until you see me next  Next visit is for a physical exam in 4 months ,  fasting Please make an appointment

## 2013-07-20 NOTE — Assessment & Plan Note (Signed)
Managed elsewhere 

## 2013-07-20 NOTE — Assessment & Plan Note (Signed)
On Coumadin, currently followed by the [facility]

## 2013-07-20 NOTE — Assessment & Plan Note (Signed)
Lower extremity edema after a cruise to [place]; Likely due to increased salt intake. She is taking Coumadin although  was not therapeutic 2 weeks ago and adjustments were made. On clinical grounds I don't suspect DVT. Plan: BMP, rx diuretis for few days (edecrin, patient allergic to sulfa); if no improvement she will let me know

## 2013-07-20 NOTE — Progress Notes (Signed)
Pre visit review using our clinic review tool, if applicable. No additional management support is needed unless otherwise documented below in the visit note. 

## 2013-07-21 ENCOUNTER — Telehealth: Payer: Self-pay | Admitting: Internal Medicine

## 2013-07-21 LAB — BASIC METABOLIC PANEL
BUN: 9 mg/dL (ref 6–23)
CHLORIDE: 106 meq/L (ref 96–112)
CO2: 23 meq/L (ref 19–32)
Calcium: 8.7 mg/dL (ref 8.4–10.5)
Creatinine, Ser: 0.7 mg/dL (ref 0.4–1.2)
GFR: 102.7 mL/min (ref >60.00)
Glucose, Bld: 83 mg/dL (ref 70–99)
Potassium: 3.8 mEq/L (ref 3.5–5.1)
Sodium: 137 mEq/L (ref 135–145)

## 2013-07-21 NOTE — Telephone Encounter (Signed)
4.14.15  Pt states that her pharmacy will not have the RX ethacrynic acid (EDECRIN) 25 MG available for pickup until 4.15.15 and pt is wondering if they should an OTC or should they wait until tomorrow.  Please advise.

## 2013-07-21 NOTE — Telephone Encounter (Signed)
Advised that there are no OTC. Ok till wait until next day.

## 2013-07-31 ENCOUNTER — Telehealth: Payer: Self-pay

## 2013-07-31 NOTE — Telephone Encounter (Signed)
UDS: 07/22/2013 Positive for Hydrocodone (sees pain management) Negative for alprazolam (PRN) Per Dr Drue NovelPaz low risk

## 2013-08-24 ENCOUNTER — Encounter: Payer: Self-pay | Admitting: Internal Medicine

## 2013-08-26 ENCOUNTER — Other Ambulatory Visit: Payer: Self-pay | Admitting: Family Medicine

## 2013-08-26 ENCOUNTER — Other Ambulatory Visit: Payer: Self-pay | Admitting: Internal Medicine

## 2013-08-26 DIAGNOSIS — R112 Nausea with vomiting, unspecified: Secondary | ICD-10-CM

## 2013-08-28 ENCOUNTER — Telehealth: Payer: Self-pay | Admitting: *Deleted

## 2013-08-28 DIAGNOSIS — F411 Generalized anxiety disorder: Secondary | ICD-10-CM

## 2013-08-28 MED ORDER — ALPRAZOLAM 0.5 MG PO TABS: ORAL_TABLET | ORAL | Status: DC

## 2013-08-28 NOTE — Telephone Encounter (Signed)
Rx for Xanax faxed. 

## 2013-08-28 NOTE — Addendum Note (Signed)
Addended by: Willow Ora E on: 08/28/2013 02:58 PM   Modules accepted: Orders

## 2013-08-28 NOTE — Telephone Encounter (Signed)
Xanax 0.5mg   Last OV- 07/20/13 Last re-filled- 07/06/13 #30 / 0 rf  UDS - 07/22/13 LOW

## 2013-08-28 NOTE — Telephone Encounter (Signed)
done

## 2013-08-28 NOTE — Telephone Encounter (Signed)
rx sent to DR.PAZ. Pending  

## 2013-10-08 NOTE — Telephone Encounter (Signed)
[facility] [address]

## 2013-11-13 ENCOUNTER — Telehealth: Payer: Self-pay | Admitting: Internal Medicine

## 2013-11-13 NOTE — Telephone Encounter (Signed)
Caller name: Deanna Relation to ZO:XWRUEAVWUJpt:[place] Call back number: [phone]  [address] Pharmacy:  Reason for call:   Deanna needs faxed and signed orders stating that pt can come coumadin 5 days prior to injection.  injection is scheduled for 12/09/2013  Fax is [phone].

## 2013-11-19 NOTE — Telephone Encounter (Signed)
This request is to stop the coumadin prior to injection.   Please advise

## 2013-11-19 NOTE — Telephone Encounter (Signed)
They need to call the [facility], they manage her coumadin

## 2013-11-19 NOTE — Telephone Encounter (Signed)
Spoke with Deanna and gave information as noted.

## 2013-11-20 ENCOUNTER — Telehealth: Payer: Self-pay | Admitting: Internal Medicine

## 2013-11-20 NOTE — Telephone Encounter (Signed)
Call from the Coumadin clinic asking  if patient needs a heparin bridge for a procedure, she does, she is high risk for clots.

## 2013-11-24 ENCOUNTER — Ambulatory Visit (INDEPENDENT_AMBULATORY_CARE_PROVIDER_SITE_OTHER): Payer: 59 | Admitting: Internal Medicine

## 2013-11-24 ENCOUNTER — Encounter: Payer: Self-pay | Admitting: Internal Medicine

## 2013-11-24 ENCOUNTER — Telehealth: Payer: Self-pay | Admitting: Internal Medicine

## 2013-11-24 VITALS — BP 126/81 | HR 91 | Temp 98.5°F | Ht 64.0 in | Wt 217.5 lb

## 2013-11-24 DIAGNOSIS — G43909 Migraine, unspecified, not intractable, without status migrainosus: Secondary | ICD-10-CM

## 2013-11-24 DIAGNOSIS — R609 Edema, unspecified: Secondary | ICD-10-CM

## 2013-11-24 DIAGNOSIS — G894 Chronic pain syndrome: Secondary | ICD-10-CM

## 2013-11-24 DIAGNOSIS — Z0000 Encounter for general adult medical examination without abnormal findings: Secondary | ICD-10-CM

## 2013-11-24 DIAGNOSIS — F3289 Other specified depressive episodes: Secondary | ICD-10-CM

## 2013-11-24 DIAGNOSIS — F329 Major depressive disorder, single episode, unspecified: Secondary | ICD-10-CM

## 2013-11-24 DIAGNOSIS — E538 Deficiency of other specified B group vitamins: Secondary | ICD-10-CM

## 2013-11-24 DIAGNOSIS — G4733 Obstructive sleep apnea (adult) (pediatric): Secondary | ICD-10-CM

## 2013-11-24 DIAGNOSIS — Z23 Encounter for immunization: Secondary | ICD-10-CM

## 2013-11-24 LAB — CBC WITH DIFFERENTIAL/PLATELET
BASOS PCT: 0.3 % (ref 0.0–3.0)
Basophils Absolute: 0 10*3/uL (ref 0.0–0.1)
EOS ABS: 0.2 10*3/uL (ref 0.0–0.7)
EOS PCT: 4.5 % (ref 0.0–5.0)
HCT: 36.3 % (ref 36.0–46.0)
HEMOGLOBIN: 12.5 g/dL (ref 12.0–15.0)
LYMPHS PCT: 43.6 % (ref 12.0–46.0)
Lymphs Abs: 2.1 10*3/uL (ref 0.7–4.0)
MCHC: 34.5 g/dL (ref 30.0–36.0)
MCV: 83.3 fl (ref 78.0–100.0)
Monocytes Absolute: 0.3 10*3/uL (ref 0.1–1.0)
Monocytes Relative: 6 % (ref 3.0–12.0)
Neutro Abs: 2.2 10*3/uL (ref 1.4–7.7)
Neutrophils Relative %: 45.6 % (ref 43.0–77.0)
Platelets: 189 10*3/uL (ref 150.0–400.0)
RBC: 4.36 Mil/uL (ref 3.87–5.11)
RDW: 13.9 % (ref 11.5–15.5)
WBC: 4.8 10*3/uL (ref 4.0–10.5)

## 2013-11-24 LAB — VITAMIN B12: Vitamin B-12: 305 pg/mL (ref 211–911)

## 2013-11-24 LAB — ALT: ALT: 15 U/L (ref 0–35)

## 2013-11-24 LAB — LIPID PANEL
CHOL/HDL RATIO: 7
Cholesterol: 245 mg/dL — ABNORMAL HIGH (ref 0–200)
HDL: 35.1 mg/dL — ABNORMAL LOW (ref >39.00)
NonHDL: 209.9
TRIGLYCERIDES: 335 mg/dL — AB (ref 0.0–149.0)
VLDL: 67 mg/dL — ABNORMAL HIGH (ref 0.0–40.0)

## 2013-11-24 LAB — LDL CHOLESTEROL, DIRECT: Direct LDL: 140 mg/dL

## 2013-11-24 LAB — TSH: TSH: 1.59 u[IU]/mL (ref 0.35–4.50)

## 2013-11-24 LAB — VITAMIN D 25 HYDROXY (VIT D DEFICIENCY, FRACTURES): VITD: 34.56 ng/mL (ref 30.00–100.00)

## 2013-11-24 LAB — AST: AST: 20 U/L (ref 0–37)

## 2013-11-24 NOTE — Assessment & Plan Note (Signed)
Self resolved, did not get diuretics d/t cost

## 2013-11-24 NOTE — Progress Notes (Signed)
Subjective:    Patient ID: Leslie Rice, female    DOB: 11-12-1965, 48 y.o.   MRN: [id number]  DOS:  11/24/2013 Type of visit - description: CPX History:   We discussed other issues, see assessment and plan  ROS Denies chest pain or difficulty breathing. Was recently seen lower extremity edema which self resolved. Nausea w/ HAs, no diarrhea or blood in the stools. Denies suicidal ideas No dysuria, gross motor or difficulty urinating  Past Medical History  Diagnosis Date  . MIGRAINE HEADACHE   . ALLERGIC RHINITIS   . Irritable bowel syndrome   . GERD   . Primary hypercoagulable state   . OSA (obstructive sleep apnea)     not on Cpap @ present 11-2013  . Chronic pain syndrome   . DEPRESSION   . HYPERLIPIDEMIA   . INSOMNIA   . ELEVATED BLOOD PRESSURE   . Birth control     not sexually active at this point     Past Surgical History  Procedure Laterality Date  . C section      x 2   . Carpal tunnel release      B  . Nasal sinus surgery      History   Social History  . Marital Status: Single    Spouse Name: N/A    Number of Children: 2  . Years of Education: N/A   Occupational History  . IT for insurance     Social History Main Topics  . Smoking status: Never Smoker   . Smokeless tobacco: Never Used  . Alcohol Use: Yes  . Drug Use: No  . Sexual Activity: Not on file   Other Topics Concern  . Not on file   Social History Narrative   2 daughters children live w/ her      Family History  Problem Relation Age of Onset  . Colon cancer Other     M, aunt, nephew  . Breast cancer Neg Hx   . CAD Father     age? "silent MIs"  . Stroke Neg Hx   . Lung cancer Father   . Diabetes Father     F, sisters x2  . Thyroid cancer Sister        Medication List       This list is accurate as of: 11/24/13  6:10 PM.  Always use your most recent med list.               ALPRAZolam 0.5 MG tablet  Commonly known as:  XANAX  Take 0.5mg  to 1mg  twice daily as  needed.     HYDROcodone-acetaminophen 10-325 MG per tablet  Commonly known as:  NORCO  Take 1 tablet by mouth every 6 (six) hours as needed.     lamoTRIgine 200 MG tablet  Commonly known as:  LAMICTAL  Take 1 tablet daily     morphine 15 MG 12 hr tablet  Commonly known as:  MS CONTIN  Take 1 tablet by mouth every 12 (twelve) hours as needed.     promethazine 25 MG tablet  Commonly known as:  PHENERGAN  TAKE 1 TABLET (25 MG TOTAL) BY MOUTH EVERY 6 (SIX) HOURS AS NEEDED.     sertraline 50 MG tablet  Commonly known as:  ZOLOFT  Take 1 tablet (50 mg total) by mouth daily.     SUMAtriptan 50 MG tablet  Commonly known as:  IMITREX  One tablet by mouth at start of migraine.  May repeat in 2 hours if no improvement x 1 dose.     warfarin 5 MG tablet  Commonly known as:  COUMADIN  - 5 mg daily except 7.5 mg on Monday and Thursday  - F/u @@ [facility]           Objective:   Physical Exam BP 126/81  Pulse 91  Temp(Src) 98.5 F (36.9 C) (Oral)  Ht 5\' 4"  (1.626 m)  Wt 217 lb 8 oz (98.657 kg)  BMI 37.32 kg/m2  SpO2 98%  LMP 11/02/2013 General -- alert, well-developed, NAD.  Neck --no thyromegaly   HEENT-- Not pale. Lungs -- normal respiratory effort, no intercostal retractions, no accessory muscle use, and normal breath sounds.  Heart-- normal rate, regular rhythm, no murmur.  Abdomen-- Not distended, good bowel sounds,soft, non-tender Extremities-- no pretibial edema bilaterally  Neurologic--  alert & oriented X3. Speech normal, gait appropriate for age, strength symmetric and appropriate for age.  Psych-- Cognition and judgment appear intact. Cooperative with normal attention span and concentration. No anxious or depressed appearing.      Assessment & Plan:

## 2013-11-24 NOTE — Telephone Encounter (Signed)
I spoke with the patient today in ref to heparin bridge :  in the past she recommended procedures w/o bridge and did well . She understands that without a bridge she is at higher risk of a clot but is willing to proceed . Please call the coumadin clinic-- advise ok to hold coumadin in preparation to local injection,restart ASAP

## 2013-11-24 NOTE — Assessment & Plan Note (Addendum)
Since the last time she was here, she felt more depressed after she stopped Zoloft. Went back on Zoloft 50 mg daily and doing relatively well. on Xanax as needed, uds 07-2013 low

## 2013-11-24 NOTE — Patient Instructions (Signed)
Get your blood work before you leave   myfitnesspal   ?  Exercise 3 hours a week  Please see a gynecologist, call if a referral is needed   Next visit is for routine check up 6 months

## 2013-11-24 NOTE — Assessment & Plan Note (Signed)
Currently not having a CPAP due to the cost. Recommend to contact pulmonary and get that set up

## 2013-11-24 NOTE — Assessment & Plan Note (Signed)
Current symptoms are well-controlled

## 2013-11-24 NOTE — Assessment & Plan Note (Signed)
Currently managed per Dr. Ethelene Halamos, to have a local injections soon

## 2013-11-24 NOTE — Assessment & Plan Note (Addendum)
Td-- today Sister age 46 just dx w/ colon cancer stage IV Mother and aunt had colon ca age dx in their 5470s Pt had a cscope at age 48, normal @ [facility] Will refer her  back to GI Used to see gyn, plans to call and set up an appointment  Diet-exercise discussed

## 2013-11-24 NOTE — Telephone Encounter (Signed)
[facility] [phone]), pt doesn't have bridge scheduled and she will note in chart pt wishes not to schedule bridge.

## 2013-11-24 NOTE — Progress Notes (Signed)
Pre-visit discussion using our clinic review tool. No additional management support is needed unless otherwise documented below in the visit note.  

## 2013-12-03 NOTE — Telephone Encounter (Signed)
Spoke with Crystal at the [facility], she requested documentation from Dr. Drue Novel stating it is okay for Pt to not have heparin bridge. I have faxed phone note between Dr. Drue Novel and I to [employer].

## 2013-12-09 ENCOUNTER — Ambulatory Visit: Payer: 59 | Admitting: Pharmacist Clinician (PhC)/ Clinical Pharmacy Specialist

## 2014-01-06 ENCOUNTER — Telehealth: Payer: Self-pay | Admitting: Internal Medicine

## 2014-01-06 DIAGNOSIS — F419 Anxiety disorder, unspecified: Secondary | ICD-10-CM

## 2014-01-06 NOTE — Telephone Encounter (Signed)
Caller name: Toniann FailWendy Relation to pt: self Call back number: [phone] [facility] [address] [address]  Reason for call:   Requesting refill of xanax

## 2014-01-06 NOTE — Telephone Encounter (Signed)
Pt is requesting refill for Alprazolam.  Last OV: 11/24/2013  Last Fill: 08/28/2013 # 60 1 RF UDS: 07/22/2013 Low Risk  Please advise.

## 2014-01-07 MED ORDER — ALPRAZOLAM 0.5 MG PO TABS: ORAL_TABLET | ORAL | Status: DC

## 2014-01-07 NOTE — Telephone Encounter (Signed)
[facility], Pt informed that medication has been sent to Pharmacy.

## 2014-01-07 NOTE — Telephone Encounter (Signed)
Pt is calling back regarding this rx and wants to speak with CMA or RN to discuss more information.

## 2014-01-07 NOTE — Telephone Encounter (Signed)
Spoke with Pt, [place] dying [place] at [employer]:45 tonight.

## 2014-01-07 NOTE — Telephone Encounter (Signed)
60 and 1 RF printed

## 2014-01-21 ENCOUNTER — Other Ambulatory Visit: Payer: Self-pay

## 2014-01-21 DIAGNOSIS — F411 Generalized anxiety disorder: Secondary | ICD-10-CM

## 2014-01-21 MED ORDER — LAMOTRIGINE 200 MG PO TABS: ORAL_TABLET | ORAL | Status: DC

## 2014-02-28 ENCOUNTER — Other Ambulatory Visit: Payer: Self-pay | Admitting: Internal Medicine

## 2014-03-01 ENCOUNTER — Other Ambulatory Visit: Payer: Self-pay

## 2014-04-06 ENCOUNTER — Telehealth: Payer: Self-pay | Admitting: Internal Medicine

## 2014-04-06 NOTE — Telephone Encounter (Signed)
She uses it as needed for headache, okay to prescribe #30

## 2014-04-06 NOTE — Telephone Encounter (Signed)
[facility]

## 2014-04-06 NOTE — Telephone Encounter (Signed)
Pt is requesting refill on Promethazine.  Last OV: 11/24/2013  Last Fill: 10/08/2013 # 30 0RF  Please advise.

## 2014-04-07 LAB — HM MAMMOGRAPHY: HM MAMMO: NORMAL

## 2014-04-28 ENCOUNTER — Other Ambulatory Visit: Payer: Self-pay | Admitting: Internal Medicine

## 2014-05-25 ENCOUNTER — Encounter: Payer: Self-pay | Admitting: Internal Medicine

## 2014-05-25 ENCOUNTER — Ambulatory Visit: Payer: 59 | Admitting: Internal Medicine

## 2014-05-25 ENCOUNTER — Ambulatory Visit (INDEPENDENT_AMBULATORY_CARE_PROVIDER_SITE_OTHER): Payer: 59 | Admitting: Internal Medicine

## 2014-05-25 VITALS — BP 128/84 | HR 79 | Temp 98.3°F | Ht 64.0 in | Wt 217.5 lb

## 2014-05-25 DIAGNOSIS — F329 Major depressive disorder, single episode, unspecified: Secondary | ICD-10-CM

## 2014-05-25 DIAGNOSIS — F32A Depression, unspecified: Secondary | ICD-10-CM

## 2014-05-25 DIAGNOSIS — E785 Hyperlipidemia, unspecified: Secondary | ICD-10-CM

## 2014-05-25 DIAGNOSIS — D6859 Other primary thrombophilia: Secondary | ICD-10-CM

## 2014-05-25 DIAGNOSIS — D6852 Prothrombin gene mutation: Secondary | ICD-10-CM

## 2014-05-25 MED ORDER — RIVAROXABAN 20 MG PO TABS: 20.0000 mg | ORAL_TABLET | Freq: Every day | ORAL | Status: DC

## 2014-05-25 MED ORDER — SERTRALINE HCL 100 MG PO TABS: 150.0000 mg | ORAL_TABLET | Freq: Every day | ORAL | Status: DC

## 2014-05-25 NOTE — Assessment & Plan Note (Signed)
Approximately 2002 she had a large PE and bilateral DVT, status post eval by Dr. Cyndie ChimeGranfortuna, on Coumadin for life. She took Coumadin on and off, it has been very hard for her to do that. Recently had to stop Coumadin for a procedure and  never went back. We talk about options and we agreed on the following: Start Xarelto, watch very closely for any bleeding or problems Refer to Dr. Cyndie ChimeGranfortuna until then stay on Xarelto

## 2014-05-25 NOTE — Progress Notes (Signed)
Pre visit review using our clinic review tool, if applicable. No additional management support is needed unless otherwise documented below in the visit note. 

## 2014-05-25 NOTE — Assessment & Plan Note (Addendum)
Reports that she has no get up and go, feeling flat-unmotivated  with current medications. Discussed options: Go back to a higher dose of Zoloft, add Wellbutrin, switch to Cymbalta/Effexor/Prozac. Elected to go back on a higher dose of Zoloft. Reassess in 3 months

## 2014-05-25 NOTE — Patient Instructions (Signed)
Increase sertraline to 100 mg: 1 tablet  daily for 2 weeks, then take sertraline 100 mg 1.5 tablet daily.  Next visit in 3 months

## 2014-05-25 NOTE — Assessment & Plan Note (Signed)
Last cholesterol continued to be elevated, we talk about diet and exercise today.

## 2014-05-25 NOTE — Progress Notes (Signed)
Subjective:    Patient ID: Leslie Rice, female    DOB: 06/17/1965, 49 y.o.   MRN: [id number]  DOS:  05/25/2014 Type of visit - description : rov Interval history:  A few months ago, she was diagnosed with uterine fibroids, her gynecologist introduced an IUD, since then she has increased headaches, mood swings . Also, discontinue Coumadin, it has been very hard over the years to take Coumadin, states that she can't stand not eating greens when she likes to, INR was follow up at another office and it was very hard to get it in the therapeutic range. Good compliance with sertraline but she feels that she is still flat, unmotivated and has no get up and go.   Review of Systems Denies chest pain, difficulty breathing or lower extremity edema  Past Medical History  Diagnosis Date  . MIGRAINE HEADACHE   . ALLERGIC RHINITIS   . Irritable bowel syndrome   . GERD   . Primary hypercoagulable state   . OSA (obstructive sleep apnea)     not on Cpap @ present 11-2013  . Chronic pain syndrome   . DEPRESSION   . HYPERLIPIDEMIA   . INSOMNIA   . ELEVATED BLOOD PRESSURE   . Birth control          Past Surgical History  Procedure Laterality Date  . C section      x 2   . Carpal tunnel release      B  . Nasal sinus surgery      History   Social History  . Marital Status: Single    Spouse Name: N/A  . Number of Children: 2  . Years of Education: N/A   Occupational History  . IT for insurance     Social History Main Topics  . Smoking status: Never Smoker   . Smokeless tobacco: Never Used  . Alcohol Use: Yes  . Drug Use: No  . Sexual Activity: Not on file   Other Topics Concern  . Not on file   Social History Narrative   2 daughters children live w/ her         Medication List       This list is accurate as of: 05/25/14  7:32 PM.  Always use your most recent med list.               ALPRAZolam 0.5 MG tablet  Commonly known as:  XANAX  Take 0.5mg  to 1mg  twice  daily as needed.     HYDROcodone-acetaminophen 10-325 MG per tablet  Commonly known as:  NORCO  Take 1 tablet by mouth every 6 (six) hours as needed.     lamoTRIgine 200 MG tablet  Commonly known as:  LAMICTAL  TAKE 1 TABLET DAILY     morphine 30 MG tablet  Commonly known as:  MSIR  Take 30 mg by mouth every 4 (four) hours as needed for severe pain.     morphine 15 MG 12 hr tablet  Commonly known as:  MS CONTIN  Take 1 tablet by mouth every 12 (twelve) hours as needed.     promethazine 25 MG tablet  Commonly known as:  PHENERGAN  TAKE 1 TABLET (25 MG TOTAL) BY MOUTH EVERY 6 (SIX) HOURS AS NEEDED.     rivaroxaban 20 MG Tabs tablet  Commonly known as:  XARELTO  Take 1 tablet (20 mg total) by mouth daily with supper.     sertraline 100 MG tablet  Commonly known as:  ZOLOFT  Take 1.5 tablets (150 mg total) by mouth daily.     SUMAtriptan 50 MG tablet  Commonly known as:  IMITREX  One tablet by mouth at start of migraine.  May repeat in 2 hours if no improvement x 1 dose.           Objective:   Physical Exam BP 128/84 mmHg  Pulse 79  Temp(Src) 98.3 F (36.8 C) (Oral)  Ht  (1.626 m)  Wt 217 lb 8 oz (98.657 kg)  BMI 37.32 kg/m2  SpO2 93%  General:   Well developed, well nourished . NAD.  HEENT:  Normocephalic . Face symmetric, atraumatic  Neurologic:  alert & oriented X3.  Speech normal, gait appropriate for age and unassisted Psych--  Cognition and judgment appear intact.  Cooperative with normal attention span and concentration.  Behavior appropriate. No anxious but slt  depressed appearing.       Assessment & Plan:

## 2014-05-27 ENCOUNTER — Telehealth: Payer: Self-pay | Admitting: Oncology

## 2014-05-27 NOTE — Telephone Encounter (Signed)
pt confirmed appt 06/18/14 at 1:30 w/Shadad Dx: hypercoagulable state Referring Dr. Drue NovelPaz

## 2014-05-28 ENCOUNTER — Telehealth: Payer: Self-pay | Admitting: Oncology

## 2014-05-28 NOTE — Telephone Encounter (Signed)
Chart delivered on 05/28/14.  TG °

## 2014-06-18 ENCOUNTER — Ambulatory Visit: Payer: 59

## 2014-06-18 ENCOUNTER — Ambulatory Visit: Payer: 59 | Admitting: Oncology

## 2014-06-18 ENCOUNTER — Telehealth: Payer: Self-pay | Admitting: Oncology

## 2014-06-18 NOTE — Telephone Encounter (Signed)
Per Dr. Everlena CooperShadad-[facility]

## 2014-07-14 ENCOUNTER — Other Ambulatory Visit: Payer: Self-pay

## 2014-08-30 ENCOUNTER — Ambulatory Visit (INDEPENDENT_AMBULATORY_CARE_PROVIDER_SITE_OTHER): Payer: 59 | Admitting: Internal Medicine

## 2014-08-30 ENCOUNTER — Encounter: Payer: Self-pay | Admitting: Internal Medicine

## 2014-08-30 VITALS — BP 122/64 | HR 71 | Temp 98.3°F | Ht 64.0 in | Wt 220.1 lb

## 2014-08-30 DIAGNOSIS — D6852 Prothrombin gene mutation: Secondary | ICD-10-CM

## 2014-08-30 DIAGNOSIS — F329 Major depressive disorder, single episode, unspecified: Secondary | ICD-10-CM | POA: Diagnosis not present

## 2014-08-30 DIAGNOSIS — D6859 Other primary thrombophilia: Secondary | ICD-10-CM

## 2014-08-30 DIAGNOSIS — F32A Depression, unspecified: Secondary | ICD-10-CM

## 2014-08-30 MED ORDER — SERTRALINE HCL 100 MG PO TABS: 100.0000 mg | ORAL_TABLET | Freq: Every day | ORAL | Status: DC

## 2014-08-30 NOTE — Assessment & Plan Note (Addendum)
At the last office visit, I recommended to start xarelto and see hematology, she was unable to see hematology, the clinic is somewhat  far away from her. I offer again a prescription for Xarelto but she is somewhat reluctant, likes to discuss with hematology. Plan:  Refer to Dr. Myna HidalgoEnnever , location of his practice is much more convenient for the patient.

## 2014-08-30 NOTE — Progress Notes (Signed)
Pre visit review using our clinic review tool, if applicable. No additional management support is needed unless otherwise documented below in the visit note. 

## 2014-08-30 NOTE — Assessment & Plan Note (Addendum)
Since the last visit, she decided not to increase sertraline, and self d/c lamictal; shee felt that with or without Lamictal she was doing about the same. At this point symptoms are well control : No change, continue sertraline

## 2014-08-30 NOTE — Progress Notes (Signed)
Subjective:    Patient ID: Leslie Rice, female    DOB: 10/1Vincenza Rice/1967, 49 y.o.   MRN: [id number]  DOS:  08/30/2014 Type of visit - description : rov Interval history: Depression. Since the last visit, did not increase sertraline , feeling well emotionally. Self discontinue Lamictal, taking Xanax very rarely. not taking Xarelto.    Review of Systems  In general feels well. No chest pain or difficulty breathing Anxiety is not a major issue at this point, no suicidal ideas She remains active exercising to 3 times a week  Past Medical History  Diagnosis Date  . MIGRAINE HEADACHE   . ALLERGIC RHINITIS   . Irritable bowel syndrome   . GERD   . Primary hypercoagulable state   . OSA (obstructive sleep apnea)     not on Cpap @ present 11-2013  . Chronic pain syndrome   . DEPRESSION   . HYPERLIPIDEMIA   . INSOMNIA   . ELEVATED BLOOD PRESSURE   . Birth control          Past Surgical History  Procedure Laterality Date  . C section      x 2   . Carpal tunnel release      B  . Nasal sinus surgery      History   Social History  . Marital Status: Single    Spouse Name: N/A  . Number of Children: 2  . Years of Education: N/A   Occupational History  . IT for insurance     Social History Main Topics  . Smoking status: Never Smoker   . Smokeless tobacco: Never Used  . Alcohol Use: Yes  . Drug Use: No  . Sexual Activity: Not on file   Other Topics Concern  . Not on file   Social History Narrative   2 daughters children live w/ her    One is moving to college 2016         Medication List       This list is accurate as of: 08/30/14  5:42 PM.  Always use your most recent med list.               ALPRAZolam 0.5 MG tablet  Commonly known as:  XANAX  Take 0.5mg  to 1mg  twice daily as needed.     HYDROcodone-acetaminophen 10-325 MG per tablet  Commonly known as:  NORCO  Take 1 tablet by mouth every 6 (six) hours as needed.     morphine 30 MG tablet    Commonly known as:  MSIR  Take 30 mg by mouth every 12 (twelve) hours as needed for severe pain.     promethazine 25 MG tablet  Commonly known as:  PHENERGAN  TAKE 1 TABLET (25 MG TOTAL) BY MOUTH EVERY 6 (SIX) HOURS AS NEEDED.     sertraline 100 MG tablet  Commonly known as:  ZOLOFT  Take 1 tablet (100 mg total) by mouth daily.     SUMAtriptan 50 MG tablet  Commonly known as:  IMITREX  One tablet by mouth at start of migraine.  May repeat in 2 hours if no improvement x 1 dose.           Objective:   Physical Exam BP 122/64 mmHg  Pulse 71  Temp(Src) 98.3 F (36.8 C) (Oral)  Ht 5\' 4"  (1.626 m)  Wt 220 lb 2 oz (99.848 kg)  BMI 37.77 kg/m2  SpO2 97%  General:   Well developed, well nourished .  NAD.  HEENT:  Normocephalic . Face symmetric, atraumatic Lungs:  CTA B Normal respiratory effort, no intercostal retractions, no accessory muscle use. Heart: RRR,  no murmur.  No pretibial edema bilaterally  Skin: Not pale. Not jaundice Neurologic:  alert & oriented X3.  Speech normal, gait appropriate for age and unassisted Psych--  Cognition and judgment appear intact.  Cooperative with normal attention span and concentration.  Behavior appropriate. No anxious or depressed appearing.       Assessment & Plan:

## 2014-08-30 NOTE — Patient Instructions (Signed)
  Come back to the office in 4 to 6 months  for a physical exam  Please schedule an appointment at the front desk    Come back fasting

## 2014-09-02 ENCOUNTER — Telehealth: Payer: Self-pay | Admitting: Hematology & Oncology

## 2014-09-02 NOTE — Telephone Encounter (Signed)
Pt informed of appt on 7/19 at 10. Pt confirmed appt.

## 2014-09-17 ENCOUNTER — Telehealth: Payer: Self-pay | Admitting: Internal Medicine

## 2014-09-17 ENCOUNTER — Other Ambulatory Visit: Payer: Self-pay | Admitting: Internal Medicine

## 2014-09-17 NOTE — Telephone Encounter (Signed)
Rx printed, awaiting MD signature.  

## 2014-09-17 NOTE — Telephone Encounter (Signed)
Pt is requesting refill on Alprazolam.  Last OV: 08/30/2014 Last Fill: 01/07/2014 #60 1RF UDS: 07/12/2013 Low risk  Please advise.

## 2014-09-17 NOTE — Telephone Encounter (Signed)
error:[id number] ° °

## 2014-09-17 NOTE — Telephone Encounter (Signed)
Pt called requesting a refill ALPRAZolam (XANAX) 0.5 MG tablet. Pt states daughter is graduating and her anxiety is high. Advised pt pharmacy called already. Will keep pt posted.

## 2014-09-17 NOTE — Telephone Encounter (Signed)
Okay #60 and one refill 

## 2014-09-17 NOTE — Telephone Encounter (Signed)
Rx faxed to [facility] pharmacy.  

## 2014-10-26 ENCOUNTER — Ambulatory Visit: Payer: 59

## 2014-10-26 ENCOUNTER — Encounter: Payer: 59 | Admitting: Hematology & Oncology

## 2014-10-26 ENCOUNTER — Other Ambulatory Visit: Payer: 59

## 2014-11-01 ENCOUNTER — Telehealth: Payer: Self-pay | Admitting: Hematology & Oncology

## 2014-11-01 NOTE — Telephone Encounter (Signed)
Cancelled new patient referral due to no show in both [facility] and [facility] [facility]. Contacted Marjorie at referring office

## 2014-11-22 ENCOUNTER — Telehealth: Payer: Self-pay | Admitting: Family

## 2014-11-22 ENCOUNTER — Ambulatory Visit (HOSPITAL_BASED_OUTPATIENT_CLINIC_OR_DEPARTMENT_OTHER)
Admission: RE | Admit: 2014-11-22 | Discharge: 2014-11-22 | Disposition: A | Discharge status: Home / Self Care | Payer: BLUE CROSS/BLUE SHIELD | Source: Ambulatory Visit | Attending: Family | Admitting: Family

## 2014-11-22 ENCOUNTER — Encounter: Payer: Self-pay | Admitting: Family

## 2014-11-22 ENCOUNTER — Ambulatory Visit (INDEPENDENT_AMBULATORY_CARE_PROVIDER_SITE_OTHER): Payer: BLUE CROSS/BLUE SHIELD | Admitting: Family

## 2014-11-22 ENCOUNTER — Telehealth: Payer: Self-pay | Admitting: Internal Medicine

## 2014-11-22 VITALS — BP 120/90 | HR 86 | Temp 98.1°F | Resp 18 | Ht 64.0 in | Wt 220.2 lb

## 2014-11-22 DIAGNOSIS — M25562 Pain in left knee: Secondary | ICD-10-CM

## 2014-11-22 DIAGNOSIS — I82812 Embolism and thrombosis of superficial veins of left lower extremities: Secondary | ICD-10-CM

## 2014-11-22 MED ORDER — RIVAROXABAN 20 MG PO TABS: 20.0000 mg | ORAL_TABLET | Freq: Every day | ORAL | Status: DC

## 2014-11-22 MED ORDER — RIVAROXABAN 15 MG PO TABS: 15.0000 mg | ORAL_TABLET | Freq: Two times a day (BID) | ORAL | Status: DC

## 2014-11-22 NOTE — Progress Notes (Signed)
Subjective:    Patient ID: Leslie Rice, female    DOB: 1965/06/10, 49 y.o.   MRN: [id number]  HPI  Leslie Rice is a 49 yr old female with hx of DVT and PE, who presents today with complaint of pain and swelling of the left lower extremity.    She has been on coumadin in the past.  Per chart review, she had hx of PE 2002 while on YAZ. She also has a strong family hx of clots.   She saw Dr. Cyndie Chime back in 2002 and it was recommended that she be maintained on lifelong coumadin.  The patient stopped coumadin on her own earlier this year. The patient told me she stopped the coumadin because it was difficult to arrange anticoagulation around her frequent spinal injections. Dr. Drue Novel recommended that she start xarelto instead and establish with Hematology. Epic review indicates that she she no showed her hematology appointment in March with Dr. Clelia Croft and she no showed her appointment in July with Dr. Myna Hidalgo.  Pt tells me that she did not start xarelto due to concerns about side effects.    Drove to [place] on 7/14, returned on 7/19.  A few days after her return she noted some swelling in the left upper calf.  She started taking an aspirin.  Notes + pain in the left upper thigh as well. Yesterday was red/swollen and today it is not as red/swollen. She denies CP or SOB.  She continues to have left leg pain.    Review of Systems  Respiratory: Negative for shortness of breath.   Cardiovascular: Positive for leg swelling. Negative for chest pain and palpitations.     Past Medical History  Diagnosis Date  . MIGRAINE HEADACHE   . ALLERGIC RHINITIS   . Irritable bowel syndrome   . GERD   . Primary hypercoagulable state   . OSA (obstructive sleep apnea)     not on Cpap @ present 11-2013  . Chronic pain syndrome   . DEPRESSION   . HYPERLIPIDEMIA   . INSOMNIA   . ELEVATED BLOOD PRESSURE   . Birth control          Social History   Social History  . Marital Status: Single    Spouse Name: N/A    . Number of Children: 2  . Years of Education: N/A   Occupational History  . IT for insurance     Social History Main Topics  . Smoking status: Never Smoker   . Smokeless tobacco: Never Used  . Alcohol Use: Yes  . Drug Use: No  . Sexual Activity: Not on file   Other Topics Concern  . Not on file   Social History Narrative   2 daughters children live w/ her    One is moving to college 2016     Past Surgical History  Procedure Laterality Date  . C section      x 2   . Carpal tunnel release      B  . Nasal sinus surgery      Family History  Problem Relation Age of Onset  . Colon cancer Other     M, aunt, nephew  . Breast cancer Neg Hx   . CAD Father     age? "silent MIs"  . Stroke Neg Hx   . Lung cancer Father   . Diabetes Father     F, sisters x2  . Thyroid cancer Sister     Allergies  Allergen Reactions  . Ciprofloxacin     REACTION: vomiting  . Codeine     REACTION: vomiting  . Sulfonamide Derivatives     hives    Current Outpatient Prescriptions on File Prior to Visit  Medication Sig Dispense Refill  . ALPRAZolam (XANAX) 0.5 MG tablet Take 1 tablet (0.5 mg total) by mouth 2 (two) times daily as needed. 60 tablet 1  . HYDROcodone-acetaminophen (NORCO) 10-325 MG per tablet Take 1 tablet by mouth every 6 (six) hours as needed.    . promethazine (PHENERGAN) 25 MG tablet TAKE 1 TABLET (25 MG TOTAL) BY MOUTH EVERY 6 (SIX) HOURS AS NEEDED. 30 tablet 0  . sertraline (ZOLOFT) 100 MG tablet Take 1 tablet (100 mg total) by mouth daily. 30 tablet 6  . SUMAtriptan (IMITREX) 50 MG tablet One tablet by mouth at start of migraine.  May repeat in 2 hours if no improvement x 1 dose. 10 tablet 2   No current facility-administered medications on file prior to visit.    BP 120/90 mmHg  Pulse 86  Temp(Src) 98.1 F (36.7 C) (Oral)  Resp 18  Ht 5\' 4"  (1.626 m)  Wt 220 lb 3.2 oz (99.882 kg)  BMI 37.78 kg/m2  SpO2 99%       Objective:   Physical Exam   Constitutional: She is oriented to person, place, and time. She appears well-developed and well-nourished.  HENT:  Head: Normocephalic and atraumatic.  Cardiovascular: Normal rate, regular rhythm and normal heart sounds.   No murmur heard. Pulmonary/Chest: Effort normal and breath sounds normal. No respiratory distress. She has no wheezes.  Musculoskeletal:  Mild swelling of the LLE, + tenderness right medial upper calf  Neurological: She is alert and oriented to person, place, and time.  Psychiatric: She has a normal mood and affect. Her behavior is normal. Judgment and thought content normal.          Assessment & Plan:

## 2014-11-22 NOTE — Progress Notes (Signed)
Pre visit review using our clinic review tool, if applicable. No additional management support is needed unless otherwise documented below in the visit note. 

## 2014-11-22 NOTE — Telephone Encounter (Signed)
Patient Name: Leslie Rice DOB: 07-20-1965 Initial Comment Caller states she has hx of blood clots, thinks she has one now, moving up leg. Leg is red and hot. Has appt today with NP at 4:00. Nurse Assessment Nurse: Elijah Birk, RN, Lynda Date/Time (Eastern Time): 11/22/2014 11:57:15 AM Confirm and document reason for call. If symptomatic, describe symptoms. ---Caller states she has hx of blood clots, thinks she has one now. The pain is moving up her left leg from the calf area, on inside of knee. Leg is red and hot. Has appt today with NP at 4:00. Was taking YAZ, was on blood thinners, she took herself off Coumadin, now on baby ASA. Was supposed to see a hematologist.. Has the patient traveled out of the country within the last 30 days? ---Not Applicable Does the patient require triage? ---Yes Related visit to physician within the last 2 weeks? ---No Does the PT have any chronic conditions? (i.e. diabetes, asthma, etc.) ---Yes List chronic conditions. ---hx of blood clots, PE, takes Zoloft Did the patient indicate they were pregnant? ---No Guidelines Guideline Title Affirmed Question Affirmed Notes Leg Pain [1] Thigh or calf pain AND [2] only 1 side AND [3] present > 1 hour Final Disposition User See Physician within 4 Hours (or PCP triage) Elijah Birk, RN, Lynda Referrals REFERRED TO PCP OFFICE Disagree/Comply: Comply

## 2014-11-22 NOTE — Telephone Encounter (Signed)
thx

## 2014-11-22 NOTE — Telephone Encounter (Signed)
Pt called stating she has a possible blood clot in her left leg. She states it is swollen, red, and hot. Scheduled with Melissa 4:00pm today at pt request. Transferred call to [facility] at [facility]

## 2014-11-22 NOTE — Telephone Encounter (Signed)
FYI Patient scheduled with Sandford Craze, NP today.

## 2014-11-22 NOTE — Telephone Encounter (Signed)
Noted.  See [facility] Note.  

## 2014-11-22 NOTE — Patient Instructions (Addendum)
Start xarelto  twice daily for 21 days, then switch to  once daily. Go to ER if you develop shortness or breath or chest pain. Please call to schedule an  appointment with Dr. Myna Hidalgo [phone]. Follow up with Dr. Drue Novel in 2 weeks.

## 2014-11-24 DIAGNOSIS — I82819 Embolism and thrombosis of superficial veins of unspecified lower extremities: Secondary | ICD-10-CM | POA: Insufficient documentation

## 2014-11-24 NOTE — Assessment & Plan Note (Signed)
[place] of lower extremity is performed and reveals:  Occlusive superficial thrombus noted within the great saphenous vein from the saphenofemoral junction to the level of the mid calf.  Spoke at length with pt re: need for lifelong anticoagulation.  She is now agreeable to start xarelto.  She is given Xarelto  samples to be taken bid x 21 days, then instructed to switch to  once daily as a maintenance dose.  She is reminded of symptoms of PE (Chest pain, shortness of breath, palpitations) and advised to go to the ED if she develops any of these symptoms.  I have advised her to reschedule her appointment with Dr. Myna Hidalgo and to follow up with Dr. Drue Novel in 2 weeks.  Pt verbalizes understanding. Case was reviewed with Dr. Drue Novel- pt's PCP.

## 2014-11-25 ENCOUNTER — Other Ambulatory Visit: Payer: Self-pay | Admitting: Internal Medicine

## 2014-12-06 ENCOUNTER — Ambulatory Visit (INDEPENDENT_AMBULATORY_CARE_PROVIDER_SITE_OTHER): Payer: BLUE CROSS/BLUE SHIELD | Admitting: Internal Medicine

## 2014-12-06 ENCOUNTER — Encounter: Payer: Self-pay | Admitting: Internal Medicine

## 2014-12-06 ENCOUNTER — Other Ambulatory Visit: Payer: Self-pay

## 2014-12-06 VITALS — BP 102/74 | HR 60 | Temp 97.7°F | Ht 64.0 in | Wt 223.1 lb

## 2014-12-06 DIAGNOSIS — D6859 Other primary thrombophilia: Secondary | ICD-10-CM

## 2014-12-06 DIAGNOSIS — D6852 Prothrombin gene mutation: Secondary | ICD-10-CM | POA: Diagnosis not present

## 2014-12-06 NOTE — Progress Notes (Signed)
Pre visit review using our clinic review tool, if applicable. No additional management support is needed unless otherwise documented below in the visit note. 

## 2014-12-06 NOTE — Assessment & Plan Note (Signed)
Developed a provoked , symptomatic superficial clot a couple of weeks ago, now on Xarelto, good compliance, no apparent side effects. Differences between Coumadin and Xarelto discuss, she is agreeable to continue Xarelto indefinitely. cost may be an issue, samples provided, she will let me know if unable to get Xarelto long-term.

## 2014-12-06 NOTE — Patient Instructions (Signed)
If you have any problem refilling your Xarelto prescription, please let us know

## 2014-12-06 NOTE — Progress Notes (Signed)
Subjective:    Patient ID: Leslie Rice, female    DOB: 1966-02-16, 49 y.o.   MRN: [id number]  DOS:  12/06/2014 Type of visit - description : Follow-up Interval history: Recently developed a superficial clot after a 14-hour car ride. Had pain and swelling at the left calf, on Xarelto. Good compliance, no apparent side effects, pain and swelling decreased   Review of Systems  No chest pain or difficulty breathing No nausea, vomiting, blood in the stools.  Past Medical History  Diagnosis Date  . MIGRAINE HEADACHE   . ALLERGIC RHINITIS   . Irritable bowel syndrome   . GERD   . Primary hypercoagulable state   . OSA (obstructive sleep apnea)     not on Cpap @ present 11-2013  . Chronic pain syndrome   . DEPRESSION   . HYPERLIPIDEMIA   . INSOMNIA   . ELEVATED BLOOD PRESSURE   . Birth control          Past Surgical History  Procedure Laterality Date  . C section      x 2   . Carpal tunnel release      B  . Nasal sinus surgery      Social History   Social History  . Marital Status: Single    Spouse Name: N/A  . Number of Children: 2  . Years of Education: N/A   Occupational History  . IT for insurance     Social History Main Topics  . Smoking status: Never Smoker   . Smokeless tobacco: Never Used  . Alcohol Use: Yes  . Drug Use: No  . Sexual Activity: Not on file   Other Topics Concern  . Not on file   Social History Narrative   2 daughters children live w/ her    One is moving to college 2016         Medication List       This list is accurate as of: 12/06/14 11:59 PM.  Always use your most recent med list.               ALPRAZolam 0.5 MG tablet  Commonly known as:  XANAX  Take 1 tablet (0.5 mg total) by mouth 2 (two) times daily as needed.     HYDROcodone-acetaminophen 10-325 MG per tablet  Commonly known as:  NORCO  Take 1 tablet by mouth every 6 (six) hours as needed.     morphine 15 MG 12 hr tablet  Commonly known as:  MS CONTIN    Take 15 mg by mouth 2 (two) times daily.     promethazine 25 MG tablet  Commonly known as:  PHENERGAN  TAKE 1 TABLET (25 MG TOTAL) BY MOUTH EVERY 6 (SIX) HOURS AS NEEDED.     rivaroxaban 20 MG Tabs tablet  Commonly known as:  XARELTO  Take 1 tablet (20 mg total) by mouth daily with supper.     Rivaroxaban 15 MG Tabs tablet  Commonly known as:  XARELTO  Take 1 tablet (15 mg total) by mouth 2 (two) times daily with a meal.     sertraline 100 MG tablet  Commonly known as:  ZOLOFT  Take 1 tablet (100 mg total) by mouth daily.     SUMAtriptan 50 MG tablet  Commonly known as:  IMITREX  One tablet by mouth at start of migraine.  May repeat in 2 hours if no improvement x 1 dose.  Objective:   Physical Exam BP 102/74 mmHg  Pulse 60  Temp(Src) 97.7 F (36.5 C) (Oral)  Ht  (1.626 m)  Wt 223 lb 2 oz (101.209 kg)  BMI 38.28 kg/m2  SpO2 98% General:   Well developed, well nourished . NAD.  HEENT:  Normocephalic . Face symmetric, atraumatic MSK Calves symmetric and not TTP Neurologic:  alert & oriented X3.  Speech normal, gait appropriate for age and unassisted Psych--  Cognition and judgment appear intact.  Cooperative with normal attention span and concentration.  Behavior appropriate. No anxious or depressed appearing.       Assessment & Plan:

## 2014-12-17 NOTE — Progress Notes (Signed)
This encounter was created in error - please disregard.

## 2015-01-11 ENCOUNTER — Encounter: Payer: BLUE CROSS/BLUE SHIELD | Admitting: Internal Medicine

## 2015-01-11 ENCOUNTER — Telehealth: Payer: Self-pay | Admitting: Internal Medicine

## 2015-01-11 DIAGNOSIS — Z0289 Encounter for other administrative examinations: Secondary | ICD-10-CM

## 2015-01-20 NOTE — Telephone Encounter (Signed)
Pt was no show 01/11/15 9:00am, cpe appt, pt has not rescheduled, 2nd no show in 2016, charge or no charge?

## 2015-01-24 NOTE — Telephone Encounter (Signed)
Yes please

## 2015-03-28 ENCOUNTER — Telehealth: Payer: Self-pay | Admitting: Internal Medicine

## 2015-03-28 NOTE — Telephone Encounter (Signed)
Do not see in chart where an early colonoscopy is indicated, Colonoscopies do not start until age 47, unless otherwise indicated such as family hx of colon cancer.

## 2015-03-28 NOTE — Telephone Encounter (Signed)
Relation to UJ:WJXBpt:self Call back [phone] Pharmacy:  Reason for call:  Requesting colonoscopy order for [facility] with Dr. Dierdre SearlesLi

## 2015-03-28 NOTE — Telephone Encounter (Signed)
Patient states due to family history

## 2015-03-29 NOTE — Telephone Encounter (Signed)
Pt states we have to reply to GI Dr. Dierdre SearlesLi to approve approve going off of Xarelto for 2 days so she can have the colonoscopy done. Pt requesting call after we notify GI office. This will save her several thousand dollars to get this done before the end of the year. Please call [phone].

## 2015-03-29 NOTE — Telephone Encounter (Signed)
Please advise 

## 2015-03-30 NOTE — Telephone Encounter (Signed)
If GI is requesting a colonoscopy then is okay to hold Xarelto for 2 days. Advise patient, there is a risk of a cloth every time she holds Xarelto but to undergo a colonoscopy she must hold it.

## 2015-03-30 NOTE — Telephone Encounter (Signed)
Spoke with Pt, informed her of Dr. Drue NovelPaz recommendations, informed her that GI will need to contact us for clearance on her Xarelto. Pt informed me that she was informed by [facility] that they have faxed us 3 times, chart reviewed, do not see where we have received fax from them. Informed her that they may still be faxing to our old fax number, gave her [phone] and [phone] fax numbers. She informed me she was calling [facility] to have them fax form. Informed her that I would be looking out for it. Pt verbalized understanding.

## 2015-03-30 NOTE — Telephone Encounter (Signed)
Received fax confirmation on 03/30/2015 at 1142.

## 2015-03-30 NOTE — Telephone Encounter (Signed)
Patient called stating [facility] will be faxing over forms to [phone] attention Kaylyn.

## 2015-03-30 NOTE — Telephone Encounter (Signed)
Received fax, form completed and faxed back to [facility]-Dr. Le at [phone]. Form sent for scanning to chart.

## 2015-04-05 LAB — HM COLONOSCOPY

## 2015-04-07 ENCOUNTER — Telehealth: Payer: Self-pay | Admitting: Internal Medicine

## 2015-04-07 NOTE — Telephone Encounter (Signed)
error:[id number] ° °

## 2015-04-08 ENCOUNTER — Ambulatory Visit (INDEPENDENT_AMBULATORY_CARE_PROVIDER_SITE_OTHER): Payer: BLUE CROSS/BLUE SHIELD | Admitting: Internal Medicine

## 2015-04-08 ENCOUNTER — Encounter: Payer: Self-pay | Admitting: Internal Medicine

## 2015-04-08 VITALS — BP 124/88 | HR 78 | Temp 98.0°F | Ht 64.0 in | Wt 222.6 lb

## 2015-04-08 DIAGNOSIS — F329 Major depressive disorder, single episode, unspecified: Secondary | ICD-10-CM

## 2015-04-08 DIAGNOSIS — E559 Vitamin D deficiency, unspecified: Secondary | ICD-10-CM

## 2015-04-08 DIAGNOSIS — D6859 Other primary thrombophilia: Secondary | ICD-10-CM

## 2015-04-08 DIAGNOSIS — J069 Acute upper respiratory infection, unspecified: Secondary | ICD-10-CM

## 2015-04-08 DIAGNOSIS — Z114 Encounter for screening for human immunodeficiency virus [HIV]: Secondary | ICD-10-CM

## 2015-04-08 DIAGNOSIS — F32A Depression, unspecified: Secondary | ICD-10-CM

## 2015-04-08 DIAGNOSIS — K589 Irritable bowel syndrome without diarrhea: Secondary | ICD-10-CM

## 2015-04-08 DIAGNOSIS — E538 Deficiency of other specified B group vitamins: Secondary | ICD-10-CM

## 2015-04-08 DIAGNOSIS — G43009 Migraine without aura, not intractable, without status migrainosus: Secondary | ICD-10-CM

## 2015-04-08 LAB — CBC WITH DIFFERENTIAL/PLATELET
BASOS ABS: 0 10*3/uL (ref 0.0–0.1)
Basophils Relative: 0.1 % (ref 0.0–3.0)
EOS ABS: 0.1 10*3/uL (ref 0.0–0.7)
Eosinophils Relative: 1.5 % (ref 0.0–5.0)
HCT: 41.6 % (ref 36.0–46.0)
Hemoglobin: 14.2 g/dL (ref 12.0–15.0)
LYMPHS ABS: 1.8 10*3/uL (ref 0.7–4.0)
LYMPHS PCT: 31.3 % (ref 12.0–46.0)
MCHC: 34.1 g/dL (ref 30.0–36.0)
MCV: 92.8 fl (ref 78.0–100.0)
Monocytes Absolute: 0.3 10*3/uL (ref 0.1–1.0)
Monocytes Relative: 5.6 % (ref 3.0–12.0)
NEUTROS ABS: 3.5 10*3/uL (ref 1.4–7.7)
NEUTROS PCT: 61.5 % (ref 43.0–77.0)
PLATELETS: 214 10*3/uL (ref 150.0–400.0)
RBC: 4.48 Mil/uL (ref 3.87–5.11)
RDW: 11.6 % (ref 11.5–15.5)
WBC: 5.8 10*3/uL (ref 4.0–10.5)

## 2015-04-08 LAB — FOLATE: FOLATE: 14.3 ng/mL (ref >5.9)

## 2015-04-08 LAB — BASIC METABOLIC PANEL
BUN: 10 mg/dL (ref 6–23)
CALCIUM: 9 mg/dL (ref 8.4–10.5)
CO2: 25 meq/L (ref 19–32)
CREATININE: 0.79 mg/dL (ref 0.40–1.20)
Chloride: 107 mEq/L (ref 96–112)
GFR: 82.84 mL/min (ref >60.00)
GLUCOSE: 78 mg/dL (ref 70–99)
Potassium: 3.9 mEq/L (ref 3.5–5.1)
Sodium: 139 mEq/L (ref 135–145)

## 2015-04-08 LAB — VITAMIN B12: VITAMIN B 12: 353 pg/mL (ref 211–911)

## 2015-04-08 MED ORDER — PROMETHAZINE HCL 25 MG PO TABS: 25.0000 mg | ORAL_TABLET | Freq: Four times a day (QID) | ORAL | Status: DC | PRN

## 2015-04-08 MED ORDER — ALPRAZOLAM 0.5 MG PO TABS: 0.5000 mg | ORAL_TABLET | Freq: Two times a day (BID) | ORAL | Status: DC | PRN

## 2015-04-08 MED ORDER — SUMATRIPTAN SUCCINATE 50 MG PO TABS: ORAL_TABLET | ORAL | Status: DC

## 2015-04-08 NOTE — Progress Notes (Signed)
Subjective:    Patient ID: Leslie HewsWendy L Vidas, female    DOB: Feb 02, 1966, 49 y.o.   MRN: [id number]  DOS:  04/08/2015 Type of visit - description : Routine visit, multiple issues Interval history: Had a URI last week, self prescribed a couple doses of amoxicillin, feeling better but not completely well. Had fever, that resolved. Still has some cough and mild sputum production, greenish in color. Mild sinus congestion. IBS: Recently saw GI, had a colonoscopy 3 days ago, was prescribed Bentyl which she has not tried yet. Migraines: Well controlled with Phenergan and Imitrex, needs a refill Anxiety depression: Symptoms well-controlled, needs a refill on Xanax which he takes sporadically.    Review of Systems  Denies chest pain, difficulty breathing. No nausea, vomiting, diarrhea or blood in the stools.  Past Medical History  Diagnosis Date  . MIGRAINE HEADACHE   . ALLERGIC RHINITIS   . Irritable bowel syndrome   . GERD   . Primary hypercoagulable state (HCC)   . OSA (obstructive sleep apnea)     not on Cpap @ present 11-2013  . Chronic pain syndrome   . DEPRESSION   . HYPERLIPIDEMIA   . INSOMNIA   . ELEVATED BLOOD PRESSURE   . Birth control          Past Surgical History  Procedure Laterality Date  . C section      x 2   . Carpal tunnel release      B  . Nasal sinus surgery      Social History   Social History  . Marital Status: Single    Spouse Name: N/A  . Number of Children: 2  . Years of Education: N/A   Occupational History  . IT for insurance     Social History Main Topics  . Smoking status: Never Smoker   . Smokeless tobacco: Never Used  . Alcohol Use: Yes  . Drug Use: No  . Sexual Activity: Not on file   Other Topics Concern  . Not on file   Social History Narrative   2 daughters children live w/ her    One is moving to college 2016         Medication List       This list is accurate as of: 04/08/15 11:59 PM.  Always use your most recent  med list.               ALPRAZolam 0.5 MG tablet  Commonly known as:  XANAX  Take 1 tablet (0.5 mg total) by mouth 2 (two) times daily as needed.     HYDROcodone-acetaminophen 10-325 MG tablet  Commonly known as:  NORCO  Take 1 tablet by mouth every 6 (six) hours as needed.     levonorgestrel 20 MCG/24HR IUD  Commonly known as:  MIRENA  1 each by Intrauterine route once.     morphine 15 MG 12 hr tablet  Commonly known as:  MS CONTIN  Take 15 mg by mouth 2 (two) times daily.     promethazine 25 MG tablet  Commonly known as:  PHENERGAN  Take 1 tablet (25 mg total) by mouth every 6 (six) hours as needed for nausea or vomiting.     rivaroxaban 20 MG Tabs tablet  Commonly known as:  XARELTO  Take 1 tablet (20 mg total) by mouth daily with supper.     sertraline 100 MG tablet  Commonly known as:  ZOLOFT  Take 1 tablet (100 mg total)  by mouth daily.     SUMAtriptan 50 MG tablet  Commonly known as:  IMITREX  One tablet by mouth at start of migraine.  May repeat in 2 hours if no improvement x 1 dose.           Objective:   Physical Exam BP 124/88 mmHg  Pulse 78  Temp(Src) 98 F (36.7 C) (Oral)  Ht  (1.626 m)  Wt 222 lb 9.6 oz (100.971 kg)  BMI 38.19 kg/m2  SpO2 99% General:   Well developed, well nourished . NAD.  HEENT:  Normocephalic . Face symmetric, atraumatic. Nose quite congested, sinuses not TTP. Throat symmetric, no red TMs: Slightly congested but no red or discharge. Lungs:  CTA B Normal respiratory effort, no intercostal retractions, no accessory muscle use. Heart: RRR,  no murmur.  No pretibial edema bilaterally  Skin: Not pale. Not jaundice Neurologic:  alert & oriented X3.  Speech normal, gait appropriate for age and unassisted Psych--  Cognition and judgment appear intact.  Cooperative with normal attention span and concentration.  Behavior appropriate. No anxious or depressed appearing.      Assessment & Plan:    Assessment Primary hypercoagulable state, change Coumadin to Xarelto 05-2014 PE 2002 while on HRT, saw Dr Cyndie Chime ;+ FH, testing on her was (-);  rx coumadin since 2002 for life  superficial phlebitis 11-2014 Elevated BP Hyperlipidemia Depression, insomnia: self dc lamictal ~ 08-2014 , on xanax rx by pcp Migraine headaches-- f/u by pcp  IBS  OSA-- no CPAP Vit B12 and Vit D def-- poor compliance w/ supplements Chronic pain syndrome ---hydrocodone Rx per Dr. Ethelene Hal  PLAN  Primary hypercoagulable state: Superficial phlebitis clinically resolved per patient, on Xarelto. Check a BMP and CBC Depression and insomnia: Currently doing well, refill Xanax Migraine headaches: Continue Phenergan and Imitrex when necessary, refill meds IBS: Recently saw GI, they prescribe Bentyl. Vitamin deficiency: Poor compliance with supplements, check labs  URI: Recommend conservative treatment, C instructions Primary care: Had a colonoscopy 04/05/2015 with Dr. Nedra Hai in [facility], reportedly normal. RTC 4-5 months , CPX

## 2015-04-08 NOTE — Progress Notes (Signed)
Pre visit review using our clinic review tool, if applicable. No additional management support is needed unless otherwise documented below in the visit note. 

## 2015-04-08 NOTE — Patient Instructions (Signed)
BEFORE YOU LEAVE THE OFFICE:  GO TO THE LAB  Get the blood work    GO TO THE FRONT DESK Schedule a complete physical exam to be done in 4 months Please be fasting  We can also see you in the afternoon and check labs the next day    AFTER YOU LEAVE THE OFFICE:   Rest, fluids , tylenol  For cough:  Take Mucinex DM twice a day as needed until better  For nasal congestion: Use OTC Nasocort or Flonase : 2 nasal sprays on each side of the nose in the morning until you feel better   Avoid decongestants such as  Pseudoephedrine or phenylephrine    Call if not gradually better over the next  10 days  Call anytime if the symptoms are severe, you have high fever, short of breath, chest pain

## 2015-04-09 LAB — HIV ANTIBODY (ROUTINE TESTING W REFLEX): HIV 1&2 Ab, 4th Generation: NONREACTIVE

## 2015-04-13 LAB — VITAMIN D 1,25 DIHYDROXY
VITAMIN D 1, 25 (OH) TOTAL: 69 pg/mL (ref 18–72)
VITAMIN D3 1, 25 (OH): 69 pg/mL

## 2015-04-19 ENCOUNTER — Other Ambulatory Visit: Payer: Self-pay | Admitting: Family

## 2015-04-19 NOTE — Telephone Encounter (Signed)
Rx sent 

## 2015-04-19 NOTE — Telephone Encounter (Signed)
Vicente MalesKaylyn-- this is Dr Drue NovelPaz pt, looks like Efraim KaufmannMelissa authorized in August.  Please advise Xarelto request.

## 2015-07-26 ENCOUNTER — Ambulatory Visit (INDEPENDENT_AMBULATORY_CARE_PROVIDER_SITE_OTHER): Payer: BLUE CROSS/BLUE SHIELD | Admitting: Internal Medicine

## 2015-07-26 ENCOUNTER — Encounter: Payer: Self-pay | Admitting: Internal Medicine

## 2015-07-26 VITALS — BP 108/60 | HR 83 | Temp 98.5°F | Ht 64.0 in | Wt 227.2 lb

## 2015-07-26 DIAGNOSIS — F329 Major depressive disorder, single episode, unspecified: Secondary | ICD-10-CM

## 2015-07-26 DIAGNOSIS — G4700 Insomnia, unspecified: Secondary | ICD-10-CM

## 2015-07-26 DIAGNOSIS — Z09 Encounter for follow-up examination after completed treatment for conditions other than malignant neoplasm: Secondary | ICD-10-CM

## 2015-07-26 DIAGNOSIS — J302 Other seasonal allergic rhinitis: Secondary | ICD-10-CM

## 2015-07-26 DIAGNOSIS — F32A Depression, unspecified: Secondary | ICD-10-CM

## 2015-07-26 HISTORY — DX: Other seasonal allergic rhinitis: J30.2

## 2015-07-26 MED ORDER — AZELASTINE HCL 0.1 % NA SOLN: 2.0000 | Freq: Every evening | NASAL | Status: DC | PRN

## 2015-07-26 MED ORDER — PREDNISONE 10 MG PO TABS
ORAL_TABLET | ORAL | Status: DC
Start: 2015-07-26 — End: 2015-09-30

## 2015-07-26 NOTE — Assessment & Plan Note (Signed)
Allergies: Sx likely related to allergies, treat with Flonase, Astelin, Mucinex and OTC antihistaminics. Also low dose prednisone. If not better let me know.  Sinusitis? abx? Depression, insomnia: Doing well, she self decreased Zoloft 50 mg daily Vitamin deficiency: Levels of vitamin B12 and D well-controlled with OTCs. RTC 09/2015 as already scheduled

## 2015-07-26 NOTE — Progress Notes (Signed)
Subjective:    Patient ID: Leslie Rice, female    DOB: 01-Sep-1965, 50 y.o.   MRN: [id number]  DOS:  07/26/2015 Type of visit - description : Acute visit  Interval history: Symptoms started about 12 days ago w/ "classic allergies": Runny nose, sneezing, itchy eyes and nose Last week, she developed a frontal headache, left ear pain and pressure. No ear discharge. She took some Benadryl, it helped to some extent, Allegra did not help.  Review of Systems + subjective fevers No nausea vomiting Very little cough, she coughs up some greenish mucus, she thinks from postnasal dripping. No actual chest congestion.  Past Medical History  Diagnosis Date  . MIGRAINE HEADACHE   . ALLERGIC RHINITIS   . Irritable bowel syndrome   . GERD   . Primary hypercoagulable state (HCC)   . OSA (obstructive sleep apnea)     not on Cpap @ present 11-2013  . Chronic pain syndrome     thoracic back pain, seeing Dr. Ethelene Hal  . DEPRESSION   . HYPERLIPIDEMIA   . INSOMNIA   . ELEVATED BLOOD PRESSURE     Past Surgical History  Procedure Laterality Date  . C section      x 2   . Carpal tunnel release      B  . Nasal sinus surgery      Social History   Social History  . Marital Status: Single    Spouse Name: N/A  . Number of Children: 2  . Years of Education: N/A   Occupational History  . IT for insurance     Social History Main Topics  . Smoking status: Never Smoker   . Smokeless tobacco: Never Used  . Alcohol Use: Yes  . Drug Use: No  . Sexual Activity: Not on file   Other Topics Concern  . Not on file   Social History Narrative   2 daughters children live w/ her    One is moving to college 2016         Medication List       This list is accurate as of: 07/26/15  5:16 PM.  Always use your most recent med list.               ALPRAZolam 0.5 MG tablet  Commonly known as:  XANAX  Take 1 tablet (0.5 mg total) by mouth 2 (two) times daily as needed.     azelastine 0.1 %  nasal spray  Commonly known as:  ASTELIN  Place 2 sprays into both nostrils at bedtime as needed for rhinitis. Use in each nostril as directed     HYDROcodone-acetaminophen 10-325 MG tablet  Commonly known as:  NORCO  Take 1 tablet by mouth every 6 (six) hours as needed.     levonorgestrel 20 MCG/24HR IUD  Commonly known as:  MIRENA  1 each by Intrauterine route once.     morphine 15 MG 12 hr tablet  Commonly known as:  MS CONTIN  Take 15 mg by mouth 2 (two) times daily.     predniSONE 10 MG tablet  Commonly known as:  DELTASONE  3 tabs x 3 days, 2 tabs x 3 days, 1 tab x 3 days     promethazine 25 MG tablet  Commonly known as:  PHENERGAN  Take 1 tablet (25 mg total) by mouth every 6 (six) hours as needed for nausea or vomiting.     rivaroxaban 20 MG Tabs tablet  Commonly known  as:  XARELTO  Take 1 tablet (20 mg total) by mouth daily with supper.     sertraline 100 MG tablet  Commonly known as:  ZOLOFT  Take 50 mg by mouth daily.     SUMAtriptan 50 MG tablet  Commonly known as:  IMITREX  One tablet by mouth at start of migraine.  May repeat in 2 hours if no improvement x 1 dose.           Objective:   Physical Exam BP 108/60 mmHg  Pulse 83  Temp(Src) 98.5 F (36.9 C) (Oral)  Ht 5\' 4"  (1.626 m)  Wt 227 lb 4 oz (103.08 kg)  BMI 38.99 kg/m2  SpO2 98% General:   Well developed, well nourished . NAD.  HEENT:  Normocephalic . Face symmetric, atraumatic. TMs: Normal Nose congested, sinuses no TTP Throat: No red, no discharge, tonsils very small. Neck: Few small lymph nodes Lungs:  CTA B Normal respiratory effort, no intercostal retractions, no accessory muscle use. Heart: RRR,  no murmur.  No pretibial edema bilaterally  Skin: Not pale. Not jaundice Neurologic:  alert & oriented X3.  Speech normal, gait appropriate for age and unassisted Psych--  Cognition and judgment appear intact.  Cooperative with normal attention span and concentration.  Behavior  appropriate. No anxious or depressed appearing.      Assessment & Plan:   Assessment Primary hypercoagulable state, change Coumadin to Xarelto 05-2014 PE 2002 while on HRT, saw Dr Cyndie ChimeGranfortuna ;+ FH, testing on her was (-);  rx coumadin since 2002 for life  superficial phlebitis 11-2014 Elevated BP Hyperlipidemia Depression, insomnia: self dc lamictal ~ 08-2014 , on xanax rx by pcp Migraine headaches-- f/u by pcp  IBS  OSA-- no CPAP Vit B12 and Vit D def-- poor compliance w/ supplements Chronic pain syndrome ---hydrocodone Rx per Dr. Ethelene Halamos  PLAN Allergies: Sx likely related to allergies, treat with Flonase, Astelin, Mucinex and OTC antihistaminics. Also low dose prednisone. If not better let me know.  Sinusitis? abx? Depression, insomnia: Doing well, she self decreased Zoloft 50 mg daily Vitamin deficiency: Levels of vitamin B12 and D well-controlled with OTCs. RTC 09/2015 as already scheduled

## 2015-07-26 NOTE — Progress Notes (Signed)
Pre visit review using our clinic review tool, if applicable. No additional management support is needed unless otherwise documented below in the visit note. 

## 2015-07-26 NOTE — Patient Instructions (Addendum)
   Take either Claritin 10 mg or Zyrtec 10 mg OTC every night  Flonase 2 sprays on each side of the nose every morning  Astelin 2 sprays on each side of the nose every night  Prednisone for a few days  Mucinex DM as needed for cough  Call if not gradually improving in the next few days or if symptoms severe, fever chills

## 2015-09-07 ENCOUNTER — Other Ambulatory Visit: Payer: Self-pay | Admitting: Internal Medicine

## 2015-09-14 ENCOUNTER — Encounter: Payer: BLUE CROSS/BLUE SHIELD | Admitting: Internal Medicine

## 2015-09-30 ENCOUNTER — Ambulatory Visit (INDEPENDENT_AMBULATORY_CARE_PROVIDER_SITE_OTHER): Payer: BLUE CROSS/BLUE SHIELD | Admitting: Internal Medicine

## 2015-09-30 ENCOUNTER — Telehealth: Payer: Self-pay

## 2015-09-30 ENCOUNTER — Encounter: Payer: Self-pay | Admitting: Internal Medicine

## 2015-09-30 VITALS — BP 120/76 | HR 72 | Temp 97.9°F | Ht 64.0 in | Wt 228.5 lb

## 2015-09-30 DIAGNOSIS — F329 Major depressive disorder, single episode, unspecified: Secondary | ICD-10-CM

## 2015-09-30 DIAGNOSIS — L989 Disorder of the skin and subcutaneous tissue, unspecified: Secondary | ICD-10-CM | POA: Diagnosis not present

## 2015-09-30 DIAGNOSIS — G4733 Obstructive sleep apnea (adult) (pediatric): Secondary | ICD-10-CM

## 2015-09-30 DIAGNOSIS — F32A Depression, unspecified: Secondary | ICD-10-CM

## 2015-09-30 DIAGNOSIS — Z09 Encounter for follow-up examination after completed treatment for conditions other than malignant neoplasm: Secondary | ICD-10-CM

## 2015-09-30 DIAGNOSIS — Z0000 Encounter for general adult medical examination without abnormal findings: Secondary | ICD-10-CM

## 2015-09-30 DIAGNOSIS — M722 Plantar fascial fibromatosis: Secondary | ICD-10-CM | POA: Diagnosis not present

## 2015-09-30 DIAGNOSIS — R600 Localized edema: Secondary | ICD-10-CM

## 2015-09-30 LAB — LIPID PANEL
CHOLESTEROL: 229 mg/dL — AB (ref 0–200)
HDL: 41.7 mg/dL (ref >39.00)
NonHDL: 187.17
Total CHOL/HDL Ratio: 5
Triglycerides: 248 mg/dL — ABNORMAL HIGH (ref 0.0–149.0)
VLDL: 49.6 mg/dL — ABNORMAL HIGH (ref 0.0–40.0)

## 2015-09-30 LAB — URINALYSIS, ROUTINE W REFLEX MICROSCOPIC
BILIRUBIN URINE: NEGATIVE
KETONES UR: NEGATIVE
Leukocytes, UA: NEGATIVE
NITRITE: NEGATIVE
PH: 5 (ref 5.0–8.0)
Specific Gravity, Urine: 1.025 (ref 1.000–1.030)
TOTAL PROTEIN, URINE-UPE24: NEGATIVE
UROBILINOGEN UA: 0.2 (ref 0.0–1.0)
Urine Glucose: NEGATIVE

## 2015-09-30 LAB — LDL CHOLESTEROL, DIRECT: Direct LDL: 119 mg/dL

## 2015-09-30 LAB — TSH: TSH: 2.42 u[IU]/mL (ref 0.35–4.50)

## 2015-09-30 NOTE — Patient Instructions (Addendum)
GO TO THE LAB : Get the blood work     GO TO THE FRONT DESK Schedule your next appointment for a  Check up in 6 months   See your gynecologist

## 2015-09-30 NOTE — Progress Notes (Signed)
Subjective:    Patient ID: Leslie Rice, female    DOB: 17-Jan-1966, 50 y.o.   MRN: [id number]  DOS:  09/30/2015 Type of visit - description : cpx Interval history: We also discussed several concerns and managed chronic med issues    Review of Systems Constitutional: No fever. No chills. No unexplained wt changes. No unusual sweats  HEENT: No dental problems, no ear discharge, no facial swelling, no voice changes. No eye discharge, no eye  redness , no  intolerance to light   Respiratory: No wheezing , no  difficulty breathing. No cough , no mucus production. Continue with fatigue, not using a CPAP  Cardiovascular: No CP,   no  Palpitations. Bilateral lower extremity edema for 2 days, no increase in salt intakes, getting a little better today.  GI: no nausea, no vomiting, no diarrhea , no  abdominal pain.  No blood in the stools. No dysphagia, no odynophagia    Endocrine: No polyphagia, no polyuria , no polydipsia  GU:  Occ has difficulty urinating, no   dysuria, gross hematuria, difficulty urinating. No urinary urgency, no frequency.  Musculoskeletal: No joint swellings. Left heel pain for the last couple of weeks, similar to plantar fasciitis she had before  Skin: No change in the color of the skin, palor , no  Rash. Skin lesion, left shoulder, see dermatology?  Allergic, immunologic: No environmental allergies , no  food allergies  Neurological: No dizziness no  syncope. No headaches. No diplopia, no slurred, no slurred speech, no motor deficits, no facial  Numbness  Hematological: No enlarged lymph nodes, no easy bruising , no unusual bleedings  Psychiatry: No suicidal ideas, no hallucinations, no beavior problems, no confusion.  No unusual/severe anxiety, no depression    Past Medical History  Diagnosis Date  . MIGRAINE HEADACHE   . ALLERGIC RHINITIS   . Irritable bowel syndrome   . GERD   . Primary hypercoagulable state (HCC)   . OSA (obstructive sleep apnea)       not on Cpap @ present 11-2013  . Chronic pain syndrome     thoracic back pain, seeing Dr. Ethelene Hal  . DEPRESSION   . HYPERLIPIDEMIA   . INSOMNIA   . ELEVATED BLOOD PRESSURE     Past Surgical History  Procedure Laterality Date  . C section      x 2   . Carpal tunnel release      B  . Nasal sinus surgery      Social History   Social History  . Marital Status: Single    Spouse Name: N/A  . Number of Children: 2  . Years of Education: N/A   Occupational History  . IT for insurance     Social History Main Topics  . Smoking status: Never Smoker   . Smokeless tobacco: Never Used  . Alcohol Use: Yes  . Drug Use: No  . Sexual Activity: Not on file   Other Topics Concern  . Not on file   Social History Narrative   2 daughters children live w/ her    One is in college    Family History  Problem Relation Age of Onset  . Colon cancer Other     M, aunt, nephew  . Breast cancer Neg Hx   . CAD Father     age? "silent MIs"  . Stroke Neg Hx   . Lung cancer Father   . Diabetes Father     F, sisters x2  .  Thyroid cancer Sister   . Colon cancer Sister     dx age 48        Medication List       This list is accurate as of: 09/30/15 11:59 PM.  Always use your most recent med list.               ALPRAZolam 0.5 MG tablet  Commonly known as:  XANAX  Take 1 tablet (0.5 mg total) by mouth 2 (two) times daily as needed.     azelastine 0.1 % nasal spray  Commonly known as:  ASTELIN  Place 2 sprays into both nostrils at bedtime as needed for rhinitis. Use in each nostril as directed     HYDROcodone-acetaminophen 10-325 MG tablet  Commonly known as:  NORCO  Take 1 tablet by mouth every 6 (six) hours as needed.     levonorgestrel 20 MCG/24HR IUD  Commonly known as:  MIRENA  1 each by Intrauterine route once.     morphine 15 MG 12 hr tablet  Commonly known as:  MS CONTIN  Take 15 mg by mouth 2 (two) times daily.     promethazine 25 MG tablet  Commonly known as:   PHENERGAN  Take 1 tablet (25 mg total) by mouth every 6 (six) hours as needed for nausea or vomiting.     rivaroxaban 20 MG Tabs tablet  Commonly known as:  XARELTO  Take 1 tablet (20 mg total) by mouth daily with supper.     sertraline 100 MG tablet  Commonly known as:  ZOLOFT  Take 0.5 tablets (50 mg total) by mouth daily.     SUMAtriptan 50 MG tablet  Commonly known as:  IMITREX  One tablet by mouth at start of migraine.  May repeat in 2 hours if no improvement x 1 dose.           Objective:   Physical Exam  Skin:      BP 120/76 mmHg  Pulse 72  Temp(Src) 97.9 F (36.6 C) (Oral)  Ht 5\' 4"  (1.626 m)  Wt 228 lb 8 oz (103.647 kg)  BMI 39.20 kg/m2  SpO2 98%  General:   Well developed, well nourished . NAD.  Neck: No  thyromegaly  HEENT:  Normocephalic . Face symmetric, atraumatic Lungs:  CTA B Normal respiratory effort, no intercostal retractions, no accessory muscle use. Heart: RRR,  no murmur.  Trace pretibial edema bilaterally  Abdomen:  Not distended, soft, non-tender. No rebound or rigidity.   Skin: Exposed areas without rash. Not pale. Not jaundice Neurologic:  alert & oriented X3.  Speech normal, gait appropriate for age and unassisted Strength symmetric and appropriate for age.  Psych: Cognition and judgment appear intact.  Cooperative with normal attention span and concentration.  Behavior appropriate. No anxious or depressed appearing.    Assessment & Plan:   Assessment Primary hypercoagulable state, change Coumadin to Xarelto 05-2014 PE 2002 while on HRT, saw Dr Cyndie ChimeGranfortuna ;+ FH, testing on her was (-);  rx coumadin since 2002 for life  superficial phlebitis 11-2014 Elevated BP Hyperlipidemia Depression, insomnia: self dc lamictal ~ 08-2014 , on xanax rx by pcp Migraine headaches-- f/u by pcp  IBS  OSA-- + test 2012, [facility]), rx a CPAP, as off 09-2015 not using it, cost Vit B12 and Vit D def-- poor compliance w/ supplements Chronic pain  syndrome ---hydrocodone Rx per Dr. Ethelene Halamos  PLAN Elevated BP: BP today is very good. OSA : so far untreated, refer  to to neurology, long-term complications of untreated sleep apnea discussed with the patient. Depression and insomnia: Controlled, RF PRN, contract and UDS today. Skin lesion: Refer to dermatology Plantar fasciitis: Stretching demonstrated  Edema: Observation, low salt diet Occ diff urinating- check UA, rec to discuss w/ gyn RTC 6 months    Today, I spent more than 15    min with the patient (in addition to CPX >50% of the time counseling and assesing chronic issues, new issues , counseling regards plantar fasciitis, etc

## 2015-09-30 NOTE — Progress Notes (Signed)
Pre visit review using our clinic review tool, if applicable. No additional management support is needed unless otherwise documented below in the visit note. 

## 2015-09-30 NOTE — Assessment & Plan Note (Addendum)
Td-- [id number] Sister age 48 just dx w/ colon cancer stage IV Mother and aunt had colon ca age dx in their 5870s Pt had a cscope at age 89, normal @ GI [facility] S/p  cscope #2 on  03-2015, no report, wnl per pt  rec to see gyn, last visit > 1 year MMG 2015 neg   Diet-exercise discussed

## 2015-09-30 NOTE — Telephone Encounter (Signed)
Spoke w/ Raynelle [facility]Julie at [facility]. Requested most recent cscope report and pathology report if any. Requested she fax to Dr. Marisa CyphersPaz's ATTN at [phone].

## 2015-10-02 NOTE — Assessment & Plan Note (Addendum)
Elevated BP: BP today is very good. OSA : so far untreated, refer to to neurology, long-term complications of untreated sleep apnea discussed with the patient. Depression and insomnia: Controlled, RF PRN, contract and UDS today. Skin lesion: Refer to dermatology Plantar fasciitis: Stretching demonstrated  Edema: Observation, low salt diet Occ diff urinating- check UA, rec to discuss w/ gyn RTC 6 months

## 2015-10-03 NOTE — Telephone Encounter (Signed)
Called [facility] again and spoke w/ Raynelle FanningJulie, informed her I haven't received requested cscope/pathology report. Was informed she had "a large stack of medical records" to process and that she "would fax as soon as she gets to the request."

## 2015-10-04 NOTE — Telephone Encounter (Signed)
Colonoscopy 04/05/2015, normal except for internal hemorrhoids, excellent prep. Dr. Conley RollsLe recommended repeat colonoscopy in 5 years. (Apparently due to FH)

## 2015-10-04 NOTE — Telephone Encounter (Signed)
Records received, forwarded to Dr. Drue NovelPaz in red folder.

## 2015-10-04 NOTE — Telephone Encounter (Signed)
Results abstracted and sent for scanning.  

## 2015-10-20 ENCOUNTER — Encounter: Payer: Self-pay | Admitting: Neurology

## 2015-10-20 ENCOUNTER — Ambulatory Visit (INDEPENDENT_AMBULATORY_CARE_PROVIDER_SITE_OTHER): Payer: BLUE CROSS/BLUE SHIELD | Admitting: Neurology

## 2015-10-20 VITALS — BP 148/90 | HR 68 | Resp 20 | Ht 63.0 in | Wt 218.0 lb

## 2015-10-20 DIAGNOSIS — Z7901 Long term (current) use of anticoagulants: Secondary | ICD-10-CM

## 2015-10-20 DIAGNOSIS — I2699 Other pulmonary embolism without acute cor pulmonale: Secondary | ICD-10-CM | POA: Diagnosis not present

## 2015-10-20 DIAGNOSIS — G4730 Sleep apnea, unspecified: Secondary | ICD-10-CM

## 2015-10-20 DIAGNOSIS — R519 Headache, unspecified: Secondary | ICD-10-CM

## 2015-10-20 DIAGNOSIS — G4710 Hypersomnia, unspecified: Secondary | ICD-10-CM

## 2015-10-20 DIAGNOSIS — G4733 Obstructive sleep apnea (adult) (pediatric): Secondary | ICD-10-CM | POA: Diagnosis not present

## 2015-10-20 DIAGNOSIS — R51 Headache: Secondary | ICD-10-CM

## 2015-10-20 HISTORY — DX: Headache, unspecified: R51.9

## 2015-10-20 HISTORY — DX: Morbid (severe) obesity due to excess calories: E66.01

## 2015-10-20 HISTORY — DX: Hypersomnia, unspecified: G47.10

## 2015-10-20 NOTE — Progress Notes (Signed)
SLEEP MEDICINE CLINIC   Provider:  Melvyn Rice  Leslie Rice, M D  Referring Provider: Wanda PlumpPaz, Leslie E, MD Primary Care Physician:  Willow OraJose Paz, MD  Chief Complaint  Patient presents with  . New Patient (Initial Visit)    had sleep study 8-9 years ago, used to be on cpap    HPI:  Leslie Rice is a 50 y.o. female , seen here as a referral from Dr. Drue NovelPaz for a sleep evaluation. Former patient of Dr Sandria ManlyLove for [employer] [facility] migra[facility].   [name] reports that she was diagnosed with sleep apnea approximately 10 years ago, she actively pursued weight loss as treatment options and was successful for about 5 years has started to regain weight back. She is not back to her weight but was present at the time of diagnosis. Recently her sister had stayed with her at the house and witnessed her to snore heavily but also witnessed apneas again. [name] reported this to Dr. Darrin NipperPass who would like her to be reevaluated for possible sleep apnea. Besides the weight gain she endorsed a decreased level of daytime energy, increased daytime sleepiness, and memory difficulties. She has a history of migraine and depression, pulmonary emboli-but she has family members affected by factor V Leiden she was tested negative repeatedly. At the time of PE she was on hormonal birth control.  She was placed on Xeralto  and oxygen at night. Had also carpal tunnel surgery and C-section. She is a mother of 2 daughters [id number] and [id number].  Chief complaint according to patient : " snoring and pausing my breath "  Sleep habits are as follows: The patient has an early bedtime habits she usually goes to bed between 8 and 9 PM and she falls asleep promptly. She feels exhausted throughout the afternoon and early evening which leads to the early bedtime. She does  watch TV in bed, but goes to sleep nonetheless. She wakes up at least once when she goes to the bathroom, but usually goes back to sleep quickly. She sleeps on one pillow and usually on her left side and  her bedroom is usually quiet and cool once she switches of the TV. She has to rise usually around 6:30 AM but often wakes up at 5 and sometimes wishes to sleep longer but can't. She has a very dry mouth at night and in the morning. Some mornings she will have a headache when waking, she has a headache today. There is no nausea with these sleep related headaches. She also has migraine , only once a month.   Her insurance did not pay for treatment of sleep apnea 10 years ago and she advised me that her insurance will change again on August 1 for this reason I would like her to go into the sleep study ASAP.   Sleep medical history and family sleep history:  Sister has OSA on CPAP. All members snore , she is the youngest of 2110. 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   Social history:  Divorced, 2 daughters, daytime   Review of Systems: Out of a complete 14 system review, the patient complains of only the following symptoms, and all other reviewed systems are negative.   Epworth score  8 , Fatigue severity score 44  , depression score    Social History   Social History  . Marital Status: Single    Spouse Name: N/A  . Number of Children: 2  . Years of Education: N/A  Occupational History  . IT for insurance     Social History Main Topics  . Smoking status: Never Smoker   . Smokeless tobacco: Never Used  . Alcohol Use: Yes  . Drug Use: No  . Sexual Activity: Not on file   Other Topics Concern  . Not on file   Social History Narrative   2 daughters children live w/ her    One is in college     Family History  Problem Relation Age of Onset  . Colon cancer Other     M, aunt, nephew  . Breast cancer Neg Hx   . CAD Father     age? "silent MIs"  . Stroke Neg Hx   . Lung cancer Father   . Diabetes Father     F, sisters x2  . Thyroid cancer Sister   . Colon cancer Sister     dx age 56    Past Medical History  Diagnosis Date  . MIGRAINE HEADACHE     . ALLERGIC RHINITIS   . Irritable bowel syndrome   . GERD   . Primary hypercoagulable state (HCC)   . OSA (obstructive sleep apnea)     not on Cpap @ present 11-2013  . Chronic pain syndrome     thoracic back pain, seeing Dr. Ethelene Hal  . DEPRESSION   . HYPERLIPIDEMIA   . INSOMNIA   . ELEVATED BLOOD PRESSURE     Past Surgical History  Procedure Laterality Date  . C section      x 2   . Carpal tunnel release      B  . Nasal sinus surgery      Current Outpatient Prescriptions  Medication Sig Dispense Refill  . ALPRAZolam (XANAX) 0.5 MG tablet Take 1 tablet (0.5 mg total) by mouth 2 (two) times daily as needed. 60 tablet 1  . azelastine (ASTELIN) 0.1 % nasal spray Place 2 sprays into both nostrils at bedtime as needed for rhinitis. Use in each nostril as directed 30 mL 3  . HYDROcodone-acetaminophen (NORCO) 10-325 MG per tablet Take 1 tablet by mouth every 6 (six) hours as needed.    Marland Kitchen levonorgestrel (MIRENA) 20 MCG/24HR IUD 1 each by Intrauterine route once.    . morphine (MS CONTIN) 15 MG 12 hr tablet Take 15 mg by mouth 2 (two) times daily.    . promethazine (PHENERGAN) 25 MG tablet Take 1 tablet (25 mg total) by mouth every 6 (six) hours as needed for nausea or vomiting. 30 tablet 2  . rivaroxaban (XARELTO) 20 MG TABS tablet Take 1 tablet (20 mg total) by mouth daily with supper. 30 tablet 6  . sertraline (ZOLOFT) 100 MG tablet Take 0.5 tablets (50 mg total) by mouth daily. 15 tablet 5  . SUMAtriptan (IMITREX) 50 MG tablet One tablet by mouth at start of migraine.  May repeat in 2 hours if no improvement x 1 dose. 10 tablet 2   No current facility-administered medications for this visit.    Allergies as of 10/20/2015 - Review Complete 10/20/2015  Allergen Reaction Noted  . Ciprofloxacin    . Codeine    . Sulfonamide derivatives      Vitals: BP 148/90 mmHg  Pulse 68  Resp 20  Ht 5\' 3"  (1.6 m)  Wt 218 lb (98.884 kg)  BMI 38.63 kg/m2 Last Weight:  Wt Readings from Last  1 Encounters:  10/20/15 218 lb (98.884 kg)   ZOX:WRUE mass index is 38.63 kg/(m^2).  Last Height:   Ht Readings from Last 1 Encounters:  10/20/15 5\' 3"  (1.6 m)    Physical exam:  General: The patient is awake, alert and appears not in acute distress. The patient is well groomed. Head: Normocephalic, atraumatic. Neck is supple. Mallampati 3,  neck circumference:16. Nasal airflow patent , TMJ is  Not evident . Prognathia is seen.  Cardiovascular:  Regular rate and rhythm, without  murmurs or carotid bruit, and without distended neck veins. Respiratory: Lungs are clear to auscultation. Skin:  Without evidence of edema, or rash Trunk: BMI is  Elevated *. The patient's posture is erect   Neurologic exam : The patient is awake and alert, oriented to place and time.   Memory subjective described as intact.  Attention span & concentration ability appears normal.  Speech is fluent,  without  dysarthria, dysphonia or aphasia.  Mood and affect are appropriate.  Cranial nerves: Pupils are equal and briskly reactive to light. Funduscopic exam without evidence of pallor or edema. Extraocular movements  in vertical and horizontal planes intact and without nystagmus. Visual fields by finger perimetry are intact. Hearing to finger rub intact.   Facial sensation intact to fine touch.  Facial motor strength is symmetric and tongue and uvula move midline. Shoulder shrug was symmetrical.   Motor exam:  Normal tone, muscle bulk and symmetric strength in all extremities.  Sensory:  Fine touch, pinprick and vibration were tested in all extremities. Proprioception tested in the upper extremities was normal.  Coordination: Rapid alternating movements in the fingers/hands was normal.  Finger-to-nose maneuver  normal without evidence of ataxia, dysmetria or tremor.  Gait and station: Patient walks without assistive device and is able unassisted to climb up to the exam table. Strength within normal  limits.  Stance is stable and normal.  Toe and heel stand were tested . Tandem gait is unfragmented. Turns with  3 Steps.  Deep tendon reflexes: in the  upper and lower extremities are symmetric and intact. Babinski maneuver response is downgoing.  The patient was advised of the nature of the diagnosed sleep disorder , the treatment options and risks for general a health and wellness arising from not treating the condition.  I spent more than  35 minutes of face to face time with the patient. Greater than 50% of time was spent in counseling and coordination of care. We have discussed the diagnosis and differential and I answered the patient's questions.     Assessment:  After physical and neurologic examination, review of laboratory studies,  Personal review of imaging studies, reports of other /same  Imaging studies ,  Results of polysomnography/ neurophysiology testing and pre-existing records as far as provided in visit., my assessment is   1) I will order a split night polysomnography for Mrs. Dewoody ASAP. I would like to see some supine sleep in the diagnostic portion. In addition she has a habit of using her TV is background noise and I would like for her to try to just use a radio on audio bulk. Is likely that she sleeps a little deeper and found her without like influence. She is usually in bed by an early time and I locate enough time to get 7 hours of sleep each night. Her sleep times have not to be changed. I hope that her high level of fatigue and daytime sleepiness can be improved by treating the suspected apnea. Since she has some sleep related headaches I will also order capnography.   Plan:  Treatment plan and additional workup :   SPLIT with Co2.   Porfirio Mylar Choua Ikner MD  10/20/2015   CC: Wanda Plump, Md [address] [place], [place] [address]

## 2015-10-25 ENCOUNTER — Telehealth: Payer: Self-pay

## 2015-10-25 NOTE — Telephone Encounter (Signed)
UDS: 09/30/2015   Negative for Alprazolam: PRN Positive for Hydrocodone: Rx provided by another MD Positive for Morphine: Rx provided by another MD   Low risk per Dr. Drue NovelPaz 10/25/2015

## 2015-10-28 ENCOUNTER — Telehealth: Payer: Self-pay | Admitting: Internal Medicine

## 2015-10-28 MED ORDER — ALPRAZOLAM 0.5 MG PO TABS: 0.5000 mg | ORAL_TABLET | Freq: Two times a day (BID) | ORAL | Status: DC | PRN

## 2015-10-28 NOTE — Telephone Encounter (Signed)
Relation to ZO:XWRUpt:self Call back number:[phone] Pharmacy: [id number] - [place], [place] - [phone] (Phone) [phone])          Reason for call:  Patient requesting prednisone due to head aches caused my allergies, patient states PCP has prescribed in the past. Patient requesting a refill ALPRAZolam (XANAX) 0.5 MG tablet due to her neck pain. Please advise

## 2015-10-28 NOTE — Telephone Encounter (Signed)
Please advise 

## 2015-10-28 NOTE — Telephone Encounter (Signed)
I won't prescribe steroids without seeing patient. She will need to be seen for assessment or will have to wait until PCP returns.   Ok to refill Xanax, verify up-to-date on CSC.

## 2015-10-28 NOTE — Telephone Encounter (Signed)
CSC and UDS completed 09/2015. Xanax Rx printed, awaiting PA signature.

## 2015-10-28 NOTE — Telephone Encounter (Signed)
[facility]. Spoke w/ Pt, informed her PCP out of office until Wednesday, and PA recommendations to be seen. Informed her that currently no appts available but informed here that [facility], number to call provided.

## 2015-11-03 ENCOUNTER — Encounter (INDEPENDENT_AMBULATORY_CARE_PROVIDER_SITE_OTHER): Payer: BLUE CROSS/BLUE SHIELD | Admitting: Neurology

## 2015-11-03 DIAGNOSIS — G4733 Obstructive sleep apnea (adult) (pediatric): Secondary | ICD-10-CM | POA: Diagnosis not present

## 2015-11-07 ENCOUNTER — Ambulatory Visit (INDEPENDENT_AMBULATORY_CARE_PROVIDER_SITE_OTHER): Payer: BLUE CROSS/BLUE SHIELD | Admitting: Family

## 2015-11-07 ENCOUNTER — Encounter: Payer: Self-pay | Admitting: Family

## 2015-11-07 ENCOUNTER — Ambulatory Visit: Payer: BLUE CROSS/BLUE SHIELD | Admitting: Neurology

## 2015-11-07 VITALS — BP 110/74 | HR 89 | Temp 98.3°F | Resp 16 | Ht 64.0 in | Wt 229.0 lb

## 2015-11-07 DIAGNOSIS — J0120 Acute ethmoidal sinusitis, unspecified: Secondary | ICD-10-CM | POA: Diagnosis not present

## 2015-11-07 MED ORDER — AMOXICILLIN-POT CLAVULANATE 875-125 MG PO TABS: 1.0000 | ORAL_TABLET | Freq: Two times a day (BID) | ORAL | 0 refills | Status: DC

## 2015-11-07 NOTE — Patient Instructions (Signed)
Please begin augmentin for probable sinusitis.  Call if symptoms worsen, if you develop fever or if symptoms are not improved in 3 days.

## 2015-11-07 NOTE — Progress Notes (Signed)
Pre visit review using our clinic review tool, if applicable. No additional management support is needed unless otherwise documented below in the visit note. 

## 2015-11-07 NOTE — Progress Notes (Signed)
Subjective:    Patient ID: Leslie Rice, female    DOB: [birth year], 50 y.o.   MRN: [id number]  HPI  [name] is a 50 yr old female who presents today with chief complaint of headaches. HA's have been intermittent x 2 weeks.  She reports that sudafed helps. Also using saline nasal spray and flonase which was helping initially. Reports that the last few days the sudafed has not been helping.  Had epistaxis yesterday. Has some neck soreness.  She feels hot but has not had a fever.   Denies associated nausea/vision changes.  Different from her typical migraines.  Last migraine was >1 month.  No improvement with ibuprofen.     Review of Systems See HPI  Past Medical History:  Diagnosis Date  . ALLERGIC RHINITIS   . Chronic pain syndrome    thoracic back pain, seeing Dr. Ethelene Hal  . DEPRESSION   . ELEVATED BLOOD PRESSURE   . GERD   . HYPERLIPIDEMIA   . INSOMNIA   . Irritable bowel syndrome   . MIGRAINE HEADACHE   . OSA (obstructive sleep apnea)    not on Cpap @ present 11-2013  . Primary hypercoagulable state [facility])      Social History   Social History  . Marital status: Single    Spouse name: N/A  . Number of children: 2  . Years of education: N/A   Occupational History  . IT for insurance     Social History Main Topics  . Smoking status: Never Smoker  . Smokeless tobacco: Never Used  . Alcohol use Yes  . Drug use: No  . Sexual activity: Not on file   Other Topics Concern  . Not on file   Social History Narrative   2 daughters children live w/ her    One is in college     Past Surgical History:  Procedure Laterality Date  . c section     x 2   . CARPAL TUNNEL RELEASE     B  . NASAL SINUS SURGERY      Family History  Problem Relation Age of Onset  . Colon cancer Other     M, aunt, nephew  . Breast cancer Neg Hx   . CAD Father     age? "silent MIs"  . Stroke Neg Hx   . Lung cancer Father   . Diabetes Father     F, sisters x2  . Thyroid cancer  Sister   . Colon cancer Sister     dx age 64    Allergies  Allergen Reactions  . Ciprofloxacin     REACTION: vomiting  . Codeine     REACTION: vomiting  . Sulfonamide Derivatives     hives    Current Outpatient Prescriptions on File Prior to Visit  Medication Sig Dispense Refill  . ALPRAZolam (XANAX) 0.5 MG tablet Take 1 tablet (0.5 mg total) by mouth 2 (two) times daily as needed. 60 tablet 0  . azelastine (ASTELIN) 0.1 % nasal spray Place 2 sprays into both nostrils at bedtime as needed for rhinitis. Use in each nostril as directed 30 mL 3  . HYDROcodone-acetaminophen (NORCO) 10-325 MG per tablet Take 1 tablet by mouth every 6 (six) hours as needed.    Marland Kitchen levonorgestrel (MIRENA) 20 MCG/24HR IUD 1 each by Intrauterine route once.    . morphine (MS CONTIN) 15 MG 12 hr tablet Take 15 mg by mouth 2 (two) times daily.    Marland Kitchen  promethazine (PHENERGAN) 25 MG tablet Take 1 tablet (25 mg total) by mouth every 6 (six) hours as needed for nausea or vomiting. 30 tablet 2  . rivaroxaban (XARELTO) 20 MG TABS tablet Take 1 tablet (20 mg total) by mouth daily with supper. 30 tablet 6  . sertraline (ZOLOFT) 100 MG tablet Take 0.5 tablets (50 mg total) by mouth daily. 15 tablet 5  . SUMAtriptan (IMITREX) 50 MG tablet One tablet by mouth at start of migraine.  May repeat in 2 hours if no improvement x 1 dose. 10 tablet 2   No current facility-administered medications on file prior to visit.     BP 110/74 (BP Location: Left Arm)   Pulse 89   Temp 98.3 F (36.8 C) (Oral)   Resp 16   Ht 5\' 4"  (1.626 m)   Wt 229 lb (103.9 kg)   SpO2 98% Comment: room air  BMI 39.31 kg/m       Objective:   Physical Exam  Constitutional: She is oriented to person, place, and time. She appears well-developed and well-nourished.  HENT:  Head: Normocephalic and atraumatic.  Right Ear: Tympanic membrane and ear canal normal.  Left Ear: Tympanic membrane and ear canal normal.  Mouth/Throat: No oropharyngeal  exudate, posterior oropharyngeal edema or posterior oropharyngeal erythema.  Cardiovascular: Normal rate, regular rhythm and normal heart sounds.   No murmur heard. Pulmonary/Chest: Effort normal and breath sounds normal. No respiratory distress. She has no wheezes.  Neurological: She is alert and oriented to person, place, and time. She exhibits normal muscle tone.  Psychiatric: She has a normal mood and affect. Her behavior is normal. Judgment and thought content normal.          Assessment & Plan:  ? Ethmoidal sinusitis- discussed case with pt's PCP- Dr. Drue Novel. Will initiate augmentin. If symptoms worsen or do not improve consider further imaging (CT).  Continue flonase, nasal saline.

## 2015-11-09 ENCOUNTER — Encounter: Payer: Self-pay | Admitting: Internal Medicine

## 2015-11-10 ENCOUNTER — Ambulatory Visit (HOSPITAL_BASED_OUTPATIENT_CLINIC_OR_DEPARTMENT_OTHER)
Admission: RE | Admit: 2015-11-10 | Discharge: 2015-11-10 | Disposition: A | Discharge status: Home / Self Care | Payer: BLUE CROSS/BLUE SHIELD | Source: Ambulatory Visit | Attending: Internal Medicine | Admitting: Internal Medicine

## 2015-11-10 ENCOUNTER — Telehealth: Payer: Self-pay | Admitting: Internal Medicine

## 2015-11-10 DIAGNOSIS — Z9889 Other specified postprocedural states: Secondary | ICD-10-CM | POA: Diagnosis not present

## 2015-11-10 DIAGNOSIS — R51 Headache: Secondary | ICD-10-CM | POA: Diagnosis present

## 2015-11-10 DIAGNOSIS — R519 Headache, unspecified: Secondary | ICD-10-CM

## 2015-11-10 DIAGNOSIS — J341 Cyst and mucocele of nose and nasal sinus: Secondary | ICD-10-CM | POA: Diagnosis not present

## 2015-11-10 MED ORDER — PREDNISONE 10 MG PO TABS: ORAL_TABLET | ORAL | 0 refills | Status: DC

## 2015-11-10 NOTE — Telephone Encounter (Signed)
I discuss the case w/ Leslie Rice few days ago, plan was to do a CT sinus if no better.  Plan: CT sinus Prednisone, see RX Let pt know, f/u next week if no better, ER if sx worsen

## 2015-11-10 NOTE — Telephone Encounter (Signed)
Please advise 

## 2015-11-10 NOTE — Telephone Encounter (Signed)
Pt notified of recommendations and verbalized understanding. States she is frustrated because her insurance deductable started over 11/08/15 and she wishes she could have had CT prior to that date, but she was understanding.

## 2015-11-10 NOTE — Telephone Encounter (Signed)
°  Relation to JJ:HERD  Call back number: [phone]  Pharmacy:  Reason for call:  Patient states headaches were improving but last night and this morning patient experiencing migraines, patient requesting prednisone. Please advise

## 2015-11-11 ENCOUNTER — Ambulatory Visit (HOSPITAL_BASED_OUTPATIENT_CLINIC_OR_DEPARTMENT_OTHER)
Admission: RE | Admit: 2015-11-11 | Discharge: 2015-11-11 | Disposition: A | Discharge status: Home / Self Care | Payer: BLUE CROSS/BLUE SHIELD | Source: Ambulatory Visit | Attending: Internal Medicine | Admitting: Internal Medicine

## 2015-11-11 ENCOUNTER — Telehealth: Payer: Self-pay | Admitting: Internal Medicine

## 2015-11-11 DIAGNOSIS — R51 Headache: Secondary | ICD-10-CM | POA: Diagnosis present

## 2015-11-11 DIAGNOSIS — R519 Headache, unspecified: Secondary | ICD-10-CM

## 2015-11-11 NOTE — Telephone Encounter (Signed)
CT sinuses show no acute sinusitis, called patient to discuss results and further steps. LMOM

## 2015-11-11 NOTE — Telephone Encounter (Signed)
Spoke with the patient, the headache is finally getting better, yesterday had some nausea. When asked, admits to having a stiff neck. No fever chills Admits to a lot of stress (she thinks the neck stiffness is from tension). Given neck stiffness and the lack of sinusitis on recent CT I must rule out ICB. She is on Xarelto. Plan: Stat CT head, patient to call in a couple of days to let me know how she's doing.

## 2015-11-11 NOTE — Telephone Encounter (Signed)
FYI

## 2015-11-11 NOTE — Telephone Encounter (Signed)
Patient returned your call.

## 2015-11-23 ENCOUNTER — Telehealth: Payer: Self-pay

## 2015-11-23 DIAGNOSIS — G4733 Obstructive sleep apnea (adult) (pediatric): Secondary | ICD-10-CM

## 2015-11-23 NOTE — Telephone Encounter (Signed)
I spoke to pt. I advised her that her sleep study revealed osa and treatment is advised. PAP therapy is indicated. Dr. Vickey Hugerohmeier recommends proceeding with a titration study to optimize therapy. Alternative therapies may include: oral appliance or ENT evaluation/procedure. I advised her that weight loss and positional therapy may be entertained. Pt says that she used to be on bipap. She is agreeable to the cpap titration study. Pt was advised to not drive or operate hazardous machinery when sleepy. Pt verbalized understanding of results. Pt had no questions at this time but was encouraged to call back if questions arise.

## 2015-11-24 ENCOUNTER — Other Ambulatory Visit: Payer: Self-pay

## 2015-11-24 DIAGNOSIS — G4733 Obstructive sleep apnea (adult) (pediatric): Secondary | ICD-10-CM

## 2015-11-24 NOTE — Addendum Note (Signed)
Addended by: Geronimo RunningINKINS, Azarel Banner A on: 11/24/2015 09:25 AM   Modules accepted: Orders

## 2015-11-29 ENCOUNTER — Ambulatory Visit: Payer: BLUE CROSS/BLUE SHIELD | Admitting: Neurology

## 2015-12-29 ENCOUNTER — Other Ambulatory Visit: Payer: Self-pay | Admitting: Internal Medicine

## 2016-02-01 ENCOUNTER — Telehealth: Payer: Self-pay

## 2016-02-01 DIAGNOSIS — G4733 Obstructive sleep apnea (adult) (pediatric): Secondary | ICD-10-CM

## 2016-02-01 NOTE — Telephone Encounter (Signed)
-----   Message from Robin H Hyler sent at 02/01/2016 10:41 AM EDT ----- Insurance denied cpap titration. Gave me an authorization number for Auto cpap and supplies. [id number]. Can we send this order to [facility]? Thanks 

## 2016-02-01 NOTE — Telephone Encounter (Signed)
I spoke to pt. I advised her that her cpap titration was denied but an auto pap was approved. Pt is agreeable to starting an auto pap. I advised her that I would send this order to [facility] and they would contact her in about one week to get her cpap set up. An appt for insurance purposes was made with pt for 04/18/16 at 8:30am. Pt verbalized understanding. Pt had no questions at this time but was encouraged to call back if questions arise.  Order for auto pap is needed. Will send to Dr. Vickey Hugerohmeier for review.  Letter for cpap start has been mailed to the pt.

## 2016-02-02 NOTE — Telephone Encounter (Signed)
Ordered auto PAP 5-15 cm water, mask to be fitted by DME. CD

## 2016-02-02 NOTE — Telephone Encounter (Signed)
Order for cpap faxed to [facility].

## 2016-02-07 ENCOUNTER — Telehealth: Payer: Self-pay | Admitting: Neurology

## 2016-02-07 DIAGNOSIS — G4733 Obstructive sleep apnea (adult) (pediatric): Secondary | ICD-10-CM

## 2016-02-07 NOTE — Telephone Encounter (Signed)
This came by staff note- Robin Hyler. I will order auto PAP with that number. CD

## 2016-02-07 NOTE — Telephone Encounter (Signed)
-----   Message from Cecilie KicksRobin H Hyler sent at 02/01/2016 10:41 AM EDT ----- Insurance denied cpap titration. Gave me an authorization number for Auto cpap and supplies. [id number]. Can we send this order to [facility]? Thanks

## 2016-02-08 NOTE — Telephone Encounter (Signed)
You already took care of this on 02/01/2016. I will cancel the duplicate order.

## 2016-02-17 ENCOUNTER — Ambulatory Visit (INDEPENDENT_AMBULATORY_CARE_PROVIDER_SITE_OTHER): Payer: BLUE CROSS/BLUE SHIELD | Admitting: Internal Medicine

## 2016-02-17 ENCOUNTER — Encounter: Payer: Self-pay | Admitting: Internal Medicine

## 2016-02-17 VITALS — BP 124/66 | HR 76 | Temp 98.1°F | Resp 14 | Ht 64.0 in | Wt 229.2 lb

## 2016-02-17 DIAGNOSIS — J0100 Acute maxillary sinusitis, unspecified: Secondary | ICD-10-CM | POA: Diagnosis not present

## 2016-02-17 MED ORDER — PREDNISONE 10 MG PO TABS: ORAL_TABLET | ORAL | 0 refills | Status: DC

## 2016-02-17 MED ORDER — AZITHROMYCIN 250 MG PO TABS: ORAL_TABLET | ORAL | 0 refills | Status: DC

## 2016-02-17 NOTE — Assessment & Plan Note (Signed)
PLAN Acute, mild sinusitis and mild bronchitis: Will Rx Zithromax, a low dose of prednisone. Intolerant to Astelin so we'll try OTC Flonase. See instructions Sleep apnea: She got retested and sleep study came back positive, waiting for her CPAP. RTC next month as already scheduled

## 2016-02-17 NOTE — Progress Notes (Signed)
Pre visit review using our clinic review tool, if applicable. No additional management support is needed unless otherwise documented below in the visit note. 

## 2016-02-17 NOTE — Progress Notes (Signed)
Subjective:    Patient ID: Leslie Rice, female    DOB: [birth year], 50 y.o.   MRN: [id number]  DOS:  02/17/2016 Type of visit - description : Acute visit Interval history: Symptoms started a week ago with fever, malaise, significant sinus congestion, green nasal discharge. 2-3 days later, symptoms "went to the chest" and she felt chest congested, had cough with green sputum production.   Review of Systems At this point fever is gone. She has + frontal and facial pain on the right side. No nausea, vomiting. No rash No flulike achiness.  Past Medical History:  Diagnosis Date  . ALLERGIC RHINITIS   . Chronic pain syndrome    thoracic back pain, seeing Dr. Ethelene Halamos  . DEPRESSION   . ELEVATED BLOOD PRESSURE   . GERD   . HYPERLIPIDEMIA   . INSOMNIA   . Irritable bowel syndrome   . MIGRAINE HEADACHE   . OSA (obstructive sleep apnea)    not on Cpap @ present 11-2013  . Primary hypercoagulable state [facility](HCC)     Past Surgical History:  Procedure Laterality Date  . c section     x 2   . CARPAL TUNNEL RELEASE     B  . NASAL SINUS SURGERY      Social History   Social History  . Marital status: Single    Spouse name: N/A  . Number of children: 2  . Years of education: N/A   Occupational History  . IT for insurance     Social History Main Topics  . Smoking status: Never Smoker  . Smokeless tobacco: Never Used  . Alcohol use Yes  . Drug use: No  . Sexual activity: Not on file   Other Topics Concern  . Not on file   Social History Narrative   2 daughters children live w/ her    One is in college         Medication List       Accurate as of 02/17/16  4:40 PM. Always use your most recent med list.          ALPRAZolam 0.5 MG tablet Commonly known as:  XANAX Take 1 tablet (0.5 mg total) by mouth 2 (two) times daily as needed.   azithromycin 250 MG tablet Commonly known as:  ZITHROMAX Z-PAK 2 tabs a day the first day, then 1 tab a day x 4 days     HYDROcodone-acetaminophen 10-325 MG tablet Commonly known as:  NORCO Take 1 tablet by mouth every 6 (six) hours as needed.   levonorgestrel 20 MCG/24HR IUD Commonly known as:  MIRENA 1 each by Intrauterine route once.   morphine 15 MG 12 hr tablet Commonly known as:  MS CONTIN Take 15 mg by mouth 2 (two) times daily.   predniSONE 10 MG tablet Commonly known as:  DELTASONE 2 tabs a day x 5 days   rivaroxaban 20 MG Tabs tablet Commonly known as:  XARELTO Take 1 tablet (20 mg total) by mouth daily with supper.   sertraline 100 MG tablet Commonly known as:  ZOLOFT Take 0.5 tablets (50 mg total) by mouth daily.   SUMAtriptan 50 MG tablet Commonly known as:  IMITREX One tablet by mouth at start of migraine.  May repeat in 2 hours if no improvement x 1 dose.          Objective:   Physical Exam BP 124/66 (BP Location: Left Arm, Patient Position: Sitting, Cuff Size: Normal)   Pulse  76   Temp 98.1 F (36.7 C) (Oral)   Resp 14   Ht 5\' 4"  (1.626 m)   Wt 229 lb 4 oz (104 kg)   SpO2 97%   BMI 39.35 kg/m  General:   Well developed, well nourished . NAD.  HEENT:  Normocephalic . Face symmetric, atraumatic. Nose is slightly congested, sinuses: Slightly TTP at the right maxillary area. TMs normal. Throat symmetric, no red Lungs:  Few rhonchi with cough only Normal respiratory effort, no intercostal retractions, no accessory muscle use. Heart: RRR,  no murmur.  No pretibial edema bilaterally  Skin: Not pale. Not jaundice Neurologic:  alert & oriented X3.  Speech normal, gait appropriate for age and unassisted Psych--  Cognition and judgment appear intact.  Cooperative with normal attention span and concentration.  Behavior appropriate. No anxious or depressed appearing.      Assessment & Plan:  Assessment Primary hypercoagulable state, change Coumadin to Xarelto 05-2014 PE 2002 while on HRT, saw Dr Cyndie ChimeGranfortuna ;+ FH, testing on her was (-);  rx coumadin since 2002  for life  superficial phlebitis 11-2014 Elevated BP Hyperlipidemia-- Pravachol, simvastatin caused aches  Depression, insomnia: self dc lamictal ~ 08-2014 , on xanax rx by pcp Migraine headaches-- f/u by pcp  IBS  OSA-- + test 2012, [facility]), rx a CPAP,repeat sleep study + 2017 Vit B12 and Vit D def-- poor compliance w/ supplements Chronic pain syndrome ---hydrocodone Rx per Dr. Ethelene Halamos Headaches: Used to see Dr. Sandria ManlyLove, S/P Topamax, BB   PLAN Acute, mild sinusitis and mild bronchitis: Will Rx Zithromax, a low dose of prednisone. Intolerant to Astelin so we'll try OTC Flonase. See instructions Sleep apnea: She got retested and sleep study came back positive, waiting for her CPAP. RTC next month as already scheduled

## 2016-02-17 NOTE — Patient Instructions (Signed)
Rest, fluids , tylenol  For cough:  Take Mucinex DM twice a day as needed until better  For nasal congestion: Use OTC Nasocort or Flonase : 2 nasal sprays on each side of the nose in the morning until you feel better  Avoid decongestants such as  Pseudoephedrine or phenylephrine    Take the antibiotic as prescribed  (zithromax )  Call if not gradually better over the next  10 days  Call anytime if the symptoms are severe   

## 2016-02-24 ENCOUNTER — Telehealth: Payer: Self-pay | Admitting: Internal Medicine

## 2016-02-24 MED ORDER — ALPRAZOLAM 0.5 MG PO TABS: 0.5000 mg | ORAL_TABLET | Freq: Two times a day (BID) | ORAL | 0 refills | Status: DC | PRN

## 2016-02-24 NOTE — Telephone Encounter (Signed)
Okay #60, no refills 

## 2016-02-24 NOTE — Telephone Encounter (Signed)
Pt is requesting refill on Alprazolam.  Last OV: 09/30/2015 Last Fill: 10/28/2015 #60 and 0RF UDS: 09/30/2015 Low risk  Please advise.

## 2016-02-24 NOTE — Telephone Encounter (Signed)
Rx printed, awaiting MD signature.  

## 2016-02-24 NOTE — Telephone Encounter (Signed)
°  Relation to AO:ZHYQpt:self Call back number:[phone]  Reason for call:  Patient requesting a refill ALPRAZolam (XANAX) 0.5 MG tablet

## 2016-02-24 NOTE — Telephone Encounter (Signed)
Rx faxed to [facility] pharmacy.  

## 2016-03-25 ENCOUNTER — Other Ambulatory Visit: Payer: Self-pay | Admitting: Internal Medicine

## 2016-03-26 ENCOUNTER — Encounter: Payer: Self-pay | Admitting: Internal Medicine

## 2016-03-26 ENCOUNTER — Ambulatory Visit (INDEPENDENT_AMBULATORY_CARE_PROVIDER_SITE_OTHER): Payer: BLUE CROSS/BLUE SHIELD | Admitting: Internal Medicine

## 2016-03-26 VITALS — BP 122/76 | HR 74 | Temp 98.1°F | Resp 14 | Ht 64.0 in | Wt 230.1 lb

## 2016-03-26 DIAGNOSIS — Z23 Encounter for immunization: Secondary | ICD-10-CM

## 2016-03-26 DIAGNOSIS — G4733 Obstructive sleep apnea (adult) (pediatric): Secondary | ICD-10-CM | POA: Diagnosis not present

## 2016-03-26 DIAGNOSIS — G43809 Other migraine, not intractable, without status migrainosus: Secondary | ICD-10-CM

## 2016-03-26 DIAGNOSIS — R030 Elevated blood-pressure reading, without diagnosis of hypertension: Secondary | ICD-10-CM

## 2016-03-26 DIAGNOSIS — G4700 Insomnia, unspecified: Secondary | ICD-10-CM | POA: Diagnosis not present

## 2016-03-26 DIAGNOSIS — F329 Major depressive disorder, single episode, unspecified: Secondary | ICD-10-CM

## 2016-03-26 DIAGNOSIS — F32A Depression, unspecified: Secondary | ICD-10-CM

## 2016-03-26 NOTE — Assessment & Plan Note (Signed)
Hypercoagulable state: On Xarelto, no apparent complications. Elevated BP: Normal BP today Depression and insomnia: controlled on sertraline and Xanax ( takes it rarely). Call for refills. OSA-good CPAP compliance Migraines-- Well-controlled, call for a Imitrex RF PRN Primary care: Flu shot today RTC 09/2016 CPX

## 2016-03-26 NOTE — Progress Notes (Signed)
Pre visit review using our clinic review tool, if applicable. No additional management support is needed unless otherwise documented below in the visit note. 

## 2016-03-26 NOTE — Patient Instructions (Signed)
  GO TO THE FRONT DESK Schedule your next appointment for a  Physical by 09-2016   

## 2016-03-26 NOTE — Progress Notes (Signed)
Subjective:    Patient ID: Leslie Rice, female    DOB: [birth year], 50 y.o.   MRN: [id number]  DOS:  03/26/2016 Type of visit - description : rov Interval history: In general feeling well. Good compliance with medications except vitamins. Good compliance with CPAP, she does feel better. Labs reviewed, not due for any blood work at this point.  Review of Systems  Denies chest pain or difficulty breathing. No nausea, vomiting, diarrhea. On Xarelto, denies blood in the stools or the urine. Has occasionally a nose bleed that quickly resolves. Emotionally doing very well.  Past Medical History:  Diagnosis Date  . ALLERGIC RHINITIS   . Chronic pain syndrome    thoracic back pain, seeing Dr. Ethelene Halamos  . DEPRESSION   . ELEVATED BLOOD PRESSURE   . GERD   . HYPERLIPIDEMIA   . INSOMNIA   . Irritable bowel syndrome   . MIGRAINE HEADACHE   . OSA (obstructive sleep apnea)    not on Cpap @ present 11-2013  . Primary hypercoagulable state [facility](HCC)     Past Surgical History:  Procedure Laterality Date  . c section     x 2   . CARPAL TUNNEL RELEASE     B  . NASAL SINUS SURGERY      Social History   Social History  . Marital status: Single    Spouse name: N/A  . Number of children: 2  . Years of education: N/A   Occupational History  . IT for insurance     Social History Main Topics  . Smoking status: Never Smoker  . Smokeless tobacco: Never Used  . Alcohol use Yes  . Drug use: No  . Sexual activity: Not on file   Other Topics Concern  . Not on file   Social History Narrative   2 daughters children live w/ her    One is in college       Allergies as of 03/26/2016      Reactions   Ciprofloxacin    REACTION: vomiting   Codeine    REACTION: vomiting   Sulfonamide Derivatives    hives      Medication List       Accurate as of 03/26/16  9:02 PM. Always use your most recent med list.          ALPRAZolam 0.5 MG tablet Commonly known as:  XANAX Take 1  tablet (0.5 mg total) by mouth 2 (two) times daily as needed.   HYDROcodone-acetaminophen 10-325 MG tablet Commonly known as:  NORCO Take 1 tablet by mouth every 6 (six) hours as needed.   levonorgestrel 20 MCG/24HR IUD Commonly known as:  MIRENA 1 each by Intrauterine route once.   morphine 15 MG 12 hr tablet Commonly known as:  MS CONTIN Take 15 mg by mouth 2 (two) times daily.   promethazine 25 MG tablet Commonly known as:  PHENERGAN Take 25 mg by mouth every 6 (six) hours as needed for nausea or vomiting.   rivaroxaban 20 MG Tabs tablet Commonly known as:  XARELTO Take 1 tablet (20 mg total) by mouth daily with supper.   sertraline 100 MG tablet Commonly known as:  ZOLOFT Take 0.5 tablets (50 mg total) by mouth daily.   SUMAtriptan 50 MG tablet Commonly known as:  IMITREX One tablet by mouth at start of migraine.  May repeat in 2 hours if no improvement x 1 dose.          Objective:  Physical Exam BP 122/76 (BP Location: Left Arm, Patient Position: Sitting, Cuff Size: Normal)   Pulse 74   Temp 98.1 F (36.7 C) (Oral)   Resp 14   Ht 5\' 4"  (1.626 m)   Wt 230 lb 2 oz (104.4 kg)   SpO2 97%   BMI 39.50 kg/m  General:   Well developed, well nourished . NAD.  HEENT:  Normocephalic . Face symmetric, atraumatic Lungs:  CTA B Normal respiratory effort, no intercostal retractions, no accessory muscle use. Heart: RRR,  no murmur.  No pretibial edema bilaterally  Skin: Not pale. Not jaundice Neurologic:  alert & oriented X3.  Speech normal, gait appropriate for age and unassisted Psych--  Cognition and judgment appear intact.  Cooperative with normal attention span and concentration.  Behavior appropriate. No anxious or depressed appearing.      Assessment & Plan:   Assessment Primary hypercoagulable state, change Coumadin to Xarelto 05-2014 PE 2002 while on HRT, saw Dr Cyndie ChimeGranfortuna ;+ FH, testing on her was (-);  rx coumadin since 2002 for life    superficial phlebitis 11-2014 Elevated BP Hyperlipidemia-- Pravachol, simvastatin caused aches  Depression, insomnia: self dc lamictal ~ 08-2014 , on xanax rx by pcp Migraine headaches-- f/u by pcp  IBS  OSA-- + test 2012, [facility]),repeat sleep study + 2017, on CPAP as off 03-2016 Vit B12 and Vit D def-- poor compliance w/ supplements Chronic pain syndrome ---hydrocodone Rx per Dr. Ethelene Halamos Headaches: on imitrex per pcp Used to see Dr. Sandria ManlyLove, S/P Topamax, BB   PLAN Hypercoagulable state: On Xarelto, no apparent complications. Elevated BP: Normal BP today Depression and insomnia: controlled on sertraline and Xanax ( takes it rarely). Call for refills. OSA-good CPAP compliance Migraines-- Well-controlled, call for a Imitrex RF PRN Primary care: Flu shot today RTC 09/2016 CPX

## 2016-04-18 ENCOUNTER — Ambulatory Visit: Payer: Self-pay | Admitting: Neurology

## 2016-04-18 ENCOUNTER — Ambulatory Visit (INDEPENDENT_AMBULATORY_CARE_PROVIDER_SITE_OTHER): Payer: BLUE CROSS/BLUE SHIELD | Admitting: Family Medicine

## 2016-04-18 ENCOUNTER — Encounter: Payer: Self-pay | Admitting: Family Medicine

## 2016-04-18 ENCOUNTER — Telehealth: Payer: Self-pay

## 2016-04-18 VITALS — BP 118/84 | HR 79 | Temp 98.6°F | Wt 229.0 lb

## 2016-04-18 DIAGNOSIS — J020 Streptococcal pharyngitis: Secondary | ICD-10-CM

## 2016-04-18 LAB — POCT RAPID STREP A (OFFICE): Rapid Strep A Screen: NEGATIVE

## 2016-04-18 MED ORDER — AMOXICILLIN 875 MG PO TABS: 875.0000 mg | ORAL_TABLET | Freq: Two times a day (BID) | ORAL | 0 refills | Status: DC

## 2016-04-18 NOTE — Patient Instructions (Addendum)
Keep drinking fluids and practicing good hand hygiene.  Someone will contact you by Friday to let you know if you should keep taking the antibiotic.  Throw away your toothbrush after 24 hours of being on antibiotic.  Ibuprofen 600 mg every 6-8 hours for pain.

## 2016-04-18 NOTE — Progress Notes (Signed)
SUBJECTIVE:   Leslie Rice is a 51 y.o. female presents to the clinic for:  Chief Complaint  Patient presents with  . Sore Throat    x2 days. painful with yellow spots.    Complains of sore throat for 2 days.  Other associated symptoms: subjective fever.  Denies: sinus congestion, sinus pain, rhinorrhea, itchy watery eyes, ear pain, ear drainage, myalgia and cough Sick Contacts: contacts w/ similar symptoms Therapy to date: ibuprofen which has been helpful  History  Smoking Status  . Never Smoker  Smokeless Tobacco  . Never Used    ROS: Pertinent items are noted in HPI  Patient's medications, allergies, past medical, surgical, social and family histories were reviewed and updated as appropriate.  OBJECTIVE:  BP 118/84 (BP Location: Left Arm, Patient Position: Sitting, Cuff Size: Large)   Pulse 79   Temp 98.6 F (37 C) (Oral)   Wt 229 lb (103.9 kg)   SpO2 98% Comment: RA  BMI 39.31 kg/m  General: Awake, alert, appearing stated age Eyes: conjunctivae and sclerae clear Ears: normal TMs bilaterally Nose: no visible exudate Oropharynx: Tonsils are erythematous, exudates present on L, no asymmetry Neck: supple, +tender adenopathy Lungs: clear to auscultation, no wheezes, rales or rhonchi, symmetric air entry, normal effort Heart: rate and rhythm regular Skin:reveals no rash Psych: Age appropriate judgment and insight  ASSESSMENT/PLAN:  Streptococcal sore throat - Plan: POCT rapid strep A, amoxicillin (AMOXIL) 875 MG tablet  Orders as above. Will culture, if neg, will call pt and have her stop using abx.  Throw away toothbrush after 24 hours of being on abx.  Continue supportive care. Letter for work given. F/u prn. Pt voiced understanding and agreement to the plan.  Jilda Rocheicholas Paul WhitesburgW[place], DO 04/18/16 9:52 AM

## 2016-04-18 NOTE — Progress Notes (Signed)
Pre visit review using our clinic review tool, if applicable. No additional management support is needed unless otherwise documented below in the visit note. 

## 2016-04-18 NOTE — Telephone Encounter (Signed)
I called pt. Dr. Vickey Hugerohmeier is out sick today. Pt's appt will need to be r/s. Pt is agreeable to a visit with the NP for her cpap due to the insurance timeline needed to follow up. An appt with Aundra MilletMegan, NP on 05/01/2016 at 8:30am. Pt verbalized understanding of new appt date and time.

## 2016-05-01 ENCOUNTER — Ambulatory Visit (INDEPENDENT_AMBULATORY_CARE_PROVIDER_SITE_OTHER): Payer: BLUE CROSS/BLUE SHIELD | Admitting: Adult Health

## 2016-05-01 ENCOUNTER — Encounter: Payer: Self-pay | Admitting: Adult Health

## 2016-05-01 VITALS — BP 118/81 | HR 78 | Ht 64.0 in | Wt 231.4 lb

## 2016-05-01 DIAGNOSIS — Z9989 Dependence on other enabling machines and devices: Secondary | ICD-10-CM

## 2016-05-01 DIAGNOSIS — G4733 Obstructive sleep apnea (adult) (pediatric): Secondary | ICD-10-CM | POA: Diagnosis not present

## 2016-05-01 NOTE — Progress Notes (Signed)
PATIENT: Leslie Rice DOB: 04-Nov-1965  REASON FOR VISIT: follow up- osa on cpap HISTORY FROM: patient  HISTORY OF PRESENT ILLNESS: Today 05/01/2016 Leslie Rice is a 51 year old female with a history of obstructive sleep apnea on CPAP. She returns today for her first compliance download. Her download indicates that she uses her machine 25 out of 30 days for compliance of 83%. She uses her machine greater than 4 hours 14 out of 30 days for compliance of 47%. On average she uses her machine 4 hours and 47 minutes. Her residual AHI is 5.4 on a minimum pressure of 5 cm water and maximum pressure 15 cm water with EPR of 3. She does not have a significant leak. She states that the first month that she uses her CPAP she had better compliance. She states this last month she has found the  mask to be extremely uncomfortable. She often takes it off during the night. She also complains that she has woken up with nosebleeds several times. She returns today for an evaluation.   HISTORY 10/20/15 per Dr. Vickey Huger: Leslie Rice is a 51 y.o. female , seen here as a referral from Dr. Drue Novel for a sleep evaluation. Former patient of Dr Sandria Manly for [employer] migraines.   Leslie Rice reports that she was diagnosed with sleep apnea approximately 10 years ago, she actively pursued weight loss as treatment options and was successful for about 5 years has started to regain weight back. She is not back to her weight but was present at the time of diagnosis. Recently her sister had stayed with her at the house and witnessed her to snore heavily but also witnessed apneas again. Leslie Rice reported this to Dr. Darrin Nipper who would like her to be reevaluated for possible sleep apnea. Besides the weight gain she endorsed a decreased level of daytime energy, increased daytime sleepiness, and memory difficulties. She has a history of migraine and depression, pulmonary emboli-but she has family members affected by factor V Leiden she was  tested negative repeatedly. At the time of PE she was on hormonal birth control.  She was placed on Xeralto  and oxygen at night. Had also carpal tunnel surgery and C-section. She is a mother of 2 daughters 83 and 20.  Chief complaint according to patient : " snoring and pausing my breath "  Sleep habits are as follows: The patient has an early bedtime habits she usually goes to bed between 8 and 9 PM and she falls asleep promptly. She feels exhausted throughout the afternoon and early evening which leads to the early bedtime. She does  watch TV in bed, but goes to sleep nonetheless. She wakes up at least once when she goes to the bathroom, but usually goes back to sleep quickly. She sleeps on one pillow and usually on her left side and her bedroom is usually quiet and cool once she switches of the TV. She has to rise usually around 6:30 AM but often wakes up at 5 and sometimes wishes to sleep longer but can't. She has a very dry mouth at night and in the morning. Some mornings she will have a headache when waking, she has a headache today. There is no nausea with these sleep related headaches. She also has migraine , only once a month.   Her insurance did not pay for treatment of sleep apnea 10 years ago and she advised me that her insurance will change again on August 1 for this reason  I would like her to go into the sleep study ASAP.   Sleep medical history and family sleep history:  Sister has OSA on CPAP. All members snore , she is the youngest of 16. 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   Social history:  Divorced, 2 daughters, daytime     REVIEW OF SYSTEMS: Out of a complete 14 system review of symptoms, the patient complains only of the following symptoms, and all other reviewed systems are negative.  See history of present illness  ALLERGIES: Allergies  Allergen Reactions  . Ciprofloxacin     REACTION: vomiting  . Codeine     REACTION: vomiting  .  Sulfonamide Derivatives     hives    HOME MEDICATIONS: Outpatient Medications Prior to Visit  Medication Sig Dispense Refill  . ALPRAZolam (XANAX) 0.5 MG tablet Take 1 tablet (0.5 mg total) by mouth 2 (two) times daily as needed. 60 tablet 0  . HYDROcodone-acetaminophen (NORCO) 10-325 MG per tablet Take 1 tablet by mouth every 6 (six) hours as needed.    Leslie Rice Kitchen levonorgestrel (MIRENA) 20 MCG/24HR IUD 1 each by Intrauterine route once.    . morphine (MS CONTIN) 15 MG 12 hr tablet Take 15 mg by mouth 2 (two) times daily.    . promethazine (PHENERGAN) 25 MG tablet Take 25 mg by mouth every 6 (six) hours as needed for nausea or vomiting.    . rivaroxaban (XARELTO) 20 MG TABS tablet Take 1 tablet (20 mg total) by mouth daily with supper. 30 tablet 5  . sertraline (ZOLOFT) 100 MG tablet Take 0.5 tablets (50 mg total) by mouth daily. 15 tablet 5  . SUMAtriptan (IMITREX) 50 MG tablet One tablet by mouth at start of migraine.  May repeat in 2 hours if no improvement x 1 dose. 10 tablet 2  . amoxicillin (AMOXIL) 875 MG tablet Take 1 tablet (875 mg total) by mouth 2 (two) times daily. (Patient not taking: Reported on 05/01/2016) 20 tablet 0   No facility-administered medications prior to visit.     PAST MEDICAL HISTORY: Past Medical History:  Diagnosis Date  . ALLERGIC RHINITIS   . Chronic pain syndrome    thoracic back pain, seeing Dr. Ethelene Hal  . DEPRESSION   . ELEVATED BLOOD PRESSURE   . GERD   . HYPERLIPIDEMIA   . INSOMNIA   . Irritable bowel syndrome   . MIGRAINE HEADACHE   . OSA (obstructive sleep apnea)    not on Cpap @ present 11-2013  . Primary hypercoagulable state (HCC)     PAST SURGICAL HISTORY: Past Surgical History:  Procedure Laterality Date  . c section     x 2   . CARPAL TUNNEL RELEASE     B  . NASAL SINUS SURGERY      FAMILY HISTORY: Family History  Problem Relation Age of Onset  . Colon cancer Other     M, aunt, nephew  . CAD Father     age? "silent MIs"  . Lung  cancer Father   . Diabetes Father     F, sisters x2  . Thyroid cancer Sister   . Colon cancer Sister     dx age 60  . Breast cancer Neg Hx   . Stroke Neg Hx     SOCIAL HISTORY: Social History   Social History  . Marital status: Single    Spouse name: N/A  . Number of children: 2  . Years of education:  N/A   Occupational History  . IT for insurance     Social History Main Topics  . Smoking status: Never Smoker  . Smokeless tobacco: Never Used  . Alcohol use Yes  . Drug use: No  . Sexual activity: Not on file   Other Topics Concern  . Not on file   Social History Narrative   2 daughters children live w/ her    One is in college       PHYSICAL EXAM  Vitals:   05/01/16 0829  BP: 118/81  Pulse: 78  Weight: 231 lb 6.4 oz (105 kg)  Height: 5\' 4"  (1.626 m)   Body mass index is 39.72 kg/m.  Generalized: Well developed, in no acute distress   Neurological examination  Mentation: Alert oriented to time, place, history taking. Follows all commands speech and language fluent Cranial nerve II-XII: Pupils were equal round reactive to light. Extraocular movements were full, visual field were full on confrontational test. Facial sensation and strength were normal. Uvula tongue midline. Head turning and shoulder shrug  were normal and symmetric. Motor: The motor testing reveals 5 over 5 strength of all 4 extremities. Good symmetric motor tone is noted throughout.  Sensory: Sensory testing is intact to soft touch on all 4 extremities. No evidence of extinction is noted.  Coordination: Cerebellar testing reveals good finger-nose-finger and heel-to-shin bilaterally.  Gait and station: Gait is normal.   DIAGNOSTIC DATA (LABS, IMAGING, TESTING) - I reviewed patient records, labs, notes, testing and imaging myself where available.  Lab Results  Component Value Date   WBC 5.8 04/08/2015   HGB 14.2 04/08/2015   HCT 41.6 04/08/2015   MCV 92.8 04/08/2015   PLT 214.0  04/08/2015      Component Value Date/Time   NA 139 04/08/2015 1137   K 3.9 04/08/2015 1137   CL 107 04/08/2015 1137   CO2 25 04/08/2015 1137   GLUCOSE 78 04/08/2015 1137   BUN 10 04/08/2015 1137   CREATININE 0.79 04/08/2015 1137   CALCIUM 9.0 04/08/2015 1137   AST 20 11/24/2013 0843   ALT 15 11/24/2013 0843   Lab Results  Component Value Date   CHOL 229 (H) 09/30/2015   HDL 41.70 09/30/2015   LDLDIRECT 119.0 09/30/2015   TRIG 248.0 (H) 09/30/2015   CHOLHDL 5 09/30/2015   No results found for: HGBA1C Lab Results  Component Value Date   VITAMINB12 353 04/08/2015   Lab Results  Component Value Date   TSH 2.42 09/30/2015      ASSESSMENT AND PLAN 51 y.o. year old female  has a past medical history of ALLERGIC RHINITIS; Chronic pain syndrome; DEPRESSION; ELEVATED BLOOD PRESSURE; GERD; HYPERLIPIDEMIA; INSOMNIA; Irritable bowel syndrome; MIGRAINE HEADACHE; OSA (obstructive sleep apnea); and Primary hypercoagulable state (HCC). here with:  1. Obstructive sleep apnea on CPAP.  The patient's download shows that she is attempting to use the machine nightly however there are several nights that she is unable to use it at least 4 hours. The patient will be sent today to our sleep lab to have her mask refitted. Hopefully if she has any more comfortable mask she will use the machine more often. She will follow-up in 3 months for a another download.     Butch PennyMegan Daisja Kessinger, MSN, NP-C 05/01/2016, 9:09 AM [facility] [address], [address] [place], [place] [address] [phone]

## 2016-05-01 NOTE — Progress Notes (Signed)
I agree with the assessment and plan as directed by NP .The patient is known to me .   Leslie Strycharz, MD  

## 2016-05-01 NOTE — Patient Instructions (Signed)
Try using cpap nightly Mask refitting today If your symptoms worsen or you develop new symptoms please let us know.

## 2016-06-26 ENCOUNTER — Other Ambulatory Visit: Payer: Self-pay | Admitting: Internal Medicine

## 2016-06-26 NOTE — Telephone Encounter (Signed)
Pt is requesting refill on Alprazolam.  Last OV: 03/26/2016 Last Fill: 02/24/2016 #60 and 0RF UDS: 09/30/2015 Low risk  Please advise.

## 2016-06-26 NOTE — Telephone Encounter (Signed)
Rx printed, awaiting MD signature.  

## 2016-06-26 NOTE — Telephone Encounter (Signed)
Rx faxed to [facility] pharmacy.  

## 2016-06-26 NOTE — Telephone Encounter (Signed)
Ok 60 and 1 RF 

## 2016-07-09 ENCOUNTER — Ambulatory Visit (INDEPENDENT_AMBULATORY_CARE_PROVIDER_SITE_OTHER): Payer: BLUE CROSS/BLUE SHIELD | Admitting: Internal Medicine

## 2016-07-09 ENCOUNTER — Emergency Department (HOSPITAL_BASED_OUTPATIENT_CLINIC_OR_DEPARTMENT_OTHER)
Admission: EM | Admit: 2016-07-09 | Discharge: 2016-07-09 | Disposition: A | Discharge status: Home / Self Care | Payer: BLUE CROSS/BLUE SHIELD | Attending: Physician Assistant | Admitting: Physician Assistant

## 2016-07-09 ENCOUNTER — Encounter (HOSPITAL_BASED_OUTPATIENT_CLINIC_OR_DEPARTMENT_OTHER): Payer: Self-pay

## 2016-07-09 ENCOUNTER — Encounter: Payer: Self-pay | Admitting: Internal Medicine

## 2016-07-09 ENCOUNTER — Emergency Department (HOSPITAL_BASED_OUTPATIENT_CLINIC_OR_DEPARTMENT_OTHER): Payer: BLUE CROSS/BLUE SHIELD

## 2016-07-09 VITALS — BP 118/78 | HR 91 | Temp 98.1°F | Resp 14 | Ht 64.0 in | Wt 232.1 lb

## 2016-07-09 DIAGNOSIS — G43809 Other migraine, not intractable, without status migrainosus: Secondary | ICD-10-CM | POA: Diagnosis not present

## 2016-07-09 DIAGNOSIS — G43819 Other migraine, intractable, without status migrainosus: Secondary | ICD-10-CM | POA: Diagnosis not present

## 2016-07-09 DIAGNOSIS — R51 Headache: Secondary | ICD-10-CM | POA: Diagnosis present

## 2016-07-09 MED ORDER — SODIUM CHLORIDE 0.9 % IV BOLUS (SEPSIS)
1000.0000 mL | Freq: Once | INTRAVENOUS | Status: AC
  Administered 2016-07-09: 1000 mL via INTRAVENOUS

## 2016-07-09 MED ORDER — PROCHLORPERAZINE EDISYLATE 5 MG/ML IJ SOLN
10.0000 mg | Freq: Once | INTRAMUSCULAR | Status: AC
  Administered 2016-07-09: 10 mg via INTRAVENOUS
  Filled 2016-07-09: qty 2

## 2016-07-09 MED ORDER — DIPHENHYDRAMINE HCL 50 MG/ML IJ SOLN
25.0000 mg | Freq: Once | INTRAMUSCULAR | Status: AC
  Administered 2016-07-09: 25 mg via INTRAVENOUS
  Filled 2016-07-09: qty 1

## 2016-07-09 NOTE — ED Provider Notes (Signed)
[facility] Provider Note   CSN: [id number] Arrival date & time: 07/09/16  1101     History   Chief Complaint Chief Complaint  Patient presents with  . Migraine    HPI Leslie Rice is a 51 y.o. female.  HPI 51 year old Caucasian female past medical history significant for migraines, obstructive sleep apnea, elevated blood pressure presents to the ED today after seeing her primary care doctor who referred her to the ED for a 4 days. Patient states that the pain is located behind the left eye, a piercing type of feeling. Patient states that she does have a history of migraines and that this feels somewhat similar. States that the migraine started 4 days ago and gradually worsened. States that is slightly improved yesterday but last night returned. States that it is a severe headache but denies worst headache of her life and states that it was gradual in onset and intensity. States that she has had a migraine this bad in the past but has been a very long time. Patient endorses associated light and sound sensitivity, nausea, emesis. Has been able to keep her Phenergan, hydrocodone, Imitrex down due to emesis. She is seen by neurologist for sleep apnea but has never been evaluated for migraines. Patient has been on blood thinners for the past 14 years due to history of blood clots she denies any fever, chills, vision changes, neck pain, chest pain, shortness of breath, abdominal pain.  Past Medical History:  Diagnosis Date  . ALLERGIC RHINITIS   . Chronic pain syndrome    thoracic back pain, seeing Dr. Ethelene Hal  . DEPRESSION   . ELEVATED BLOOD PRESSURE   . GERD   . HYPERLIPIDEMIA   . INSOMNIA   . Irritable bowel syndrome   . MIGRAINE HEADACHE   . OSA (obstructive sleep apnea)    not on Cpap @ present 11-2013  . Primary hypercoagulable state [facility])     Patient Active Problem List   Diagnosis Date Noted  . Obstructive sleep apnea 10/20/2015  . Hypersomnia with sleep apnea  10/20/2015  . Sleep related headaches 10/20/2015  . Morbid obesity due to excess calories (HCC) 10/20/2015  . Pulmonary embolism on long-term anticoagulation therapy (HCC) 10/20/2015  . PCP NOTES >>>>>>>>>>>>>>>>>>>>>>>>> 07/26/2015  . Seasonal allergies 07/26/2015  . Acute superficial venous thrombosis of lower extremity 11/24/2014  . Annual physical exam 02/29/2012  . VITAMIN B12 DEFICIENCY 07/21/2008  . VITAMIN D DEFICIENCY 07/21/2008  . Hyperlipidemia 07/21/2008  . *Primary hypercoagulable state (change coumadin to xarelto 05-2014) 07/21/2008  . *Depression 07/21/2008  . *Chronic pain syndrome (hydrocodone per Dr. Ethelene Hal) 07/21/2008  . Migraine headache 07/21/2008  . ALLERGIC RHINITIS 07/21/2008  . GERD 07/21/2008  . IRRITABLE BOWEL SYNDROME 07/21/2008  . Insomnia 07/21/2008  . FATIGUE 07/21/2008  . Edema 07/21/2008  . OSA (obstructive sleep apnea) 07/21/2008  . ELEVATED BLOOD PRESSURE 07/21/2008  . PULMONARY EMBOLISM, HX OF 2002 07/21/2008    Past Surgical History:  Procedure Laterality Date  . c section     x 2   . CARPAL TUNNEL RELEASE     B  . NASAL SINUS SURGERY      OB History    No data available       Home Medications    Prior to Admission medications   Medication Sig Start Date End Date Taking? Authorizing Provider  ALPRAZolam Prudy Feeler) 0.5 MG tablet Take 1 tablet (0.5 mg total) by mouth 2 (two) times daily as needed. 06/26/16  Wanda Plump, MD  HYDROcodone-acetaminophen [facility]) 10-325 MG per tablet Take 1 tablet by mouth every 6 (six) hours as needed.    Historical Provider, MD  levonorgestrel (MIRENA) 20 MCG/24HR IUD 1 each by Intrauterine route once.    Historical Provider, MD  morphine (MS CONTIN) 15 MG 12 hr tablet Take 15 mg by mouth 2 (two) times daily.    Historical Provider, MD  promethazine (PHENERGAN) 25 MG tablet Take 25 mg by mouth every 6 (six) hours as needed for nausea or vomiting.    Historical Provider, MD  rivaroxaban (XARELTO) 20 MG TABS  tablet Take 1 tablet (20 mg total) by mouth daily with supper. 12/29/15   Wanda Plump, MD  sertraline (ZOLOFT) 100 MG tablet Take 0.5 tablets (50 mg total) by mouth daily. 03/26/16   Wanda Plump, MD  SUMAtriptan (IMITREX) 50 MG tablet One tablet by mouth at start of migraine.  May repeat in 2 hours if no improvement x 1 dose. 04/08/15   Wanda Plump, MD    Family History Family History  Problem Relation Age of Onset  . Colon cancer Other     M, aunt, nephew  . CAD Father     age? "silent MIs"  . Lung cancer Father   . Diabetes Father     F, sisters x2  . Thyroid cancer Sister   . Colon cancer Sister     dx age 72  . Breast cancer Neg Hx   . Stroke Neg Hx     Social History Social History  Substance Use Topics  . Smoking status: Never Smoker  . Smokeless tobacco: Never Used  . Alcohol use Yes     Comment: occ     Allergies   Ciprofloxacin; Codeine; and Sulfonamide derivatives   Review of Systems Review of Systems  Constitutional: Negative for chills and fever.  HENT: Negative for congestion.   Eyes: Positive for photophobia. Negative for visual disturbance.  Respiratory: Negative for cough and shortness of breath.   Cardiovascular: Negative for chest pain and palpitations.  Gastrointestinal: Positive for nausea and vomiting. Negative for abdominal pain and diarrhea.  Musculoskeletal: Negative for gait problem, neck pain and neck stiffness.  Skin: Negative.   Neurological: Positive for headaches. Negative for dizziness, syncope, speech difficulty, weakness, light-headedness and numbness.     Physical Exam Updated Vital Signs BP (!) 140/99 (BP Location: Left Arm)   Pulse 93   Temp 98.8 F (37.1 C) (Oral)   Resp 20   Ht  (1.6 m)   Wt 105.2 kg   SpO2 97%   BMI 41.10 kg/m   Physical Exam  Constitutional: She is oriented to person, place, and time. She appears well-developed and well-nourished. No distress.  HENT:  Head: Normocephalic and atraumatic.    Mouth/Throat: Oropharynx is clear and moist.  No temporal tenderness.   Eyes: Conjunctivae and EOM are normal. Pupils are equal, round, and reactive to light. Right eye exhibits no discharge. Left eye exhibits no discharge. No scleral icterus.  Neck: Normal range of motion. Neck supple. No thyromegaly present.  No midline tenderness. No deformity or step-offs. Full range of motion. No nuchal rigidity  Cardiovascular: Normal rate, regular rhythm, normal heart sounds and intact distal pulses.  Exam reveals no gallop and no friction rub.   No murmur heard. Pulmonary/Chest: Effort normal and breath sounds normal. No respiratory distress. She has no wheezes. She has no rales. She exhibits no tenderness.  Abdominal: Soft.  Bowel sounds are normal. She exhibits no distension. There is no tenderness.  Musculoskeletal: Normal range of motion.  Lymphadenopathy:    She has no cervical adenopathy.  Neurological: She is alert and oriented to person, place, and time.  The patient is alert, attentive, and oriented x 3. Speech is clear. Cranial nerve II-VII grossly intact. Negative pronator drift. Sensation intact. Strength 5/5 in all extremities. Reflexes 2+ and symmetric at biceps, triceps, knees, and ankles. Rapid alternating movement and fine finger movements intact. Romberg is absent. Posture and gait normal.   Skin: Skin is warm and dry. Capillary refill takes less than 2 seconds.  Nursing note and vitals reviewed.    ED Treatments / Results  Labs (all labs ordered are listed, but only abnormal results are displayed) Labs Reviewed - No data to display  EKG  EKG Interpretation None       Radiology Ct Head Wo Contrast  Result Date: 07/09/2016 CLINICAL DATA:  Migraine behind left eye starting 07/05/2016, nausea and vomiting EXAM: CT HEAD WITHOUT CONTRAST TECHNIQUE: Contiguous axial images were obtained from the base of the skull through the vertex without intravenous contrast. COMPARISON:   11/11/2015 FINDINGS: Brain: No intracranial hemorrhage, mass effect or midline shift. No acute cortical infarction. No mass lesion is noted on this unenhanced scan. No hydrocephalus. Vascular: No hyperdense vessel or unexpected calcification. Skull: Normal. Negative for fracture or focal lesion. Sinuses/Orbits: Probable mucous retention cyst posterior aspect of the left sphenoid sinus measures 1.3 cm. Mild mucosal thickening bilateral ethmoid air cells. Other: None. IMPRESSION: No acute intracranial abnormality. No significant change. Again noted probable mucous retention cyst left sphenoid sinus. Electronically Signed   By: Natasha Mead M.D.   On: 07/09/2016 12:35    Procedures Procedures (including critical care time)  Medications Ordered in ED Medications  sodium chloride 0.9 % bolus 1,000 mL (1,000 mLs Intravenous New Bag/Given 07/09/16 1204)  prochlorperazine (COMPAZINE) injection 10 mg (10 mg Intravenous Given 07/09/16 1204)  diphenhydrAMINE (BENADRYL) injection 25 mg (25 mg Intravenous Given 07/09/16 1204)     Initial Impression / Assessment and Plan / ED Course  I have reviewed the triage vital signs and the nursing notes.  Pertinent labs & imaging results that were available during my care of the patient were reviewed by me and considered in my medical decision making (see chart for details).     Patient presents to the ED with complaints of headache with associated sound and light sensitivity, nausea, emesis. Patient has a history of migraines. She states the headache was gradual in onset 4 days ago. Denies sudden or maximal in onset. She does state that it is a severe headache but has had a headache this bad in the past. She has a follow-up appointment with her neurologist next month. Was seen by her PCP today who referred her to the ED for CAT scan of head, IV fluids, IV nausea medication. Patient was given fluids, migraine cocktail. Did not give Toradol as patient is on long-term Xarelto.  Presentation is not concerning for [facility], ICH, meningitis, or temporal arteritis. She is afebrile with no focal neuro deficits, nuchal rigidity or change in vision. Vital signs are stable. Patient was reassessed after medications and states she feels slightly improved. Rates her headache at 4-5/10. She would like to be discharged because our beds are "uncomfortable". States that she feels that she can manage the rest of her headache at home with her medications. CT shows no focal abnormality. Abdomen very low suspicion  for [facility] as the headache was gradual in onset. Have encouraged follow-up with her primary care doctor and neurologist. Have given the patient strict return precautions. Patient feels stable for discharge. She was able to ambulate to the bathroom with normal gait. All questions answered prior to discharge. Pt discussed with Dr. Juliann Pares who is agreeable to the above plan.     Final Clinical Impressions(s) / ED Diagnoses   Final diagnoses:  Other migraine without status migrainosus, not intractable    New Prescriptions New Prescriptions   No medications on file     Rise Mu, PA-C 07/09/16 1328    Courteney Lyn Corlis Leak, MD 07/13/16 [phone]

## 2016-07-09 NOTE — Discharge Instructions (Signed)
Please continue your medication regimen at home. Please follow-up with her primary care doctor, neurologist, return to the ED if your unable to completely resolve her migraine. He had worsening symptoms including worsening headache, vision changes, slurred speech please return to the ER sooner.

## 2016-07-09 NOTE — Assessment & Plan Note (Signed)
Persistent migraine headache: Patient presents with headache for 4 days, features similar to previous migraines but reportedly the worst of her life (or close to it). She is on Xarelto. I recommend  further eval including a CT,IV fluids, IV Phenergan or Zofran to allow p.o. medication. Discussed with the ER doctor who is willing to take the patient.

## 2016-07-09 NOTE — ED Triage Notes (Signed)
c/o "migraine" started 3/28-NAD-steady gait-sent from PCP office in the building

## 2016-07-09 NOTE — Progress Notes (Signed)
Subjective:    Patient ID: Leslie Rice, female    DOB: 1965-12-09, 51 y.o.   MRN: [id number]  DOS:  07/09/2016 Type of visit - description : acute Interval history: 4 days history of a headache, located behind the left eye, a piercing type of feeling.  She has a history of migraine headaches, symptoms are somewhat similar to previous migraines but she has not have an attack in long time. When asked, states  this is the worse headache of her life (or very close) She has associated nausea and vomiting, hasn't been able to keep down Phenergan, hydrocodone or Imitrex.   Review of Systems Denies fever chills No cough, no sinus congestion or nasal discharge. + photo- phonophobia. When asked, admits to some neck stiffness  Past Medical History:  Diagnosis Date  . ALLERGIC RHINITIS   . Chronic pain syndrome    thoracic back pain, seeing Dr. Ethelene Hal  . DEPRESSION   . ELEVATED BLOOD PRESSURE   . GERD   . HYPERLIPIDEMIA   . INSOMNIA   . Irritable bowel syndrome   . MIGRAINE HEADACHE   . OSA (obstructive sleep apnea)    not on Cpap @ present 11-2013  . Primary hypercoagulable state [facility])     Past Surgical History:  Procedure Laterality Date  . c section     x 2   . CARPAL TUNNEL RELEASE     B  . NASAL SINUS SURGERY      Social History   Social History  . Marital status: Single    Spouse name: N/A  . Number of children: 2  . Years of education: N/A   Occupational History  . IT for insurance     Social History Main Topics  . Smoking status: Never Smoker  . Smokeless tobacco: Never Used  . Alcohol use Yes     Comment: occ  . Drug use: No  . Sexual activity: Not on file   Other Topics Concern  . Not on file   Social History Narrative   2 daughters children live w/ her    One is in college       Allergies as of 07/09/2016      Reactions   Ciprofloxacin    REACTION: vomiting   Codeine    REACTION: vomiting   Sulfonamide Derivatives    hives        Medication List       Accurate as of 07/09/16  9:01 PM. Always use your most recent med list.          ALPRAZolam 0.5 MG tablet Commonly known as:  XANAX Take 1 tablet (0.5 mg total) by mouth 2 (two) times daily as needed.   HYDROcodone-acetaminophen 10-325 MG tablet Commonly known as:  NORCO Take 1 tablet by mouth every 6 (six) hours as needed.   levonorgestrel 20 MCG/24HR IUD Commonly known as:  MIRENA 1 each by Intrauterine route once.   morphine 15 MG 12 hr tablet Commonly known as:  MS CONTIN Take 15 mg by mouth 2 (two) times daily.   promethazine 25 MG tablet Commonly known as:  PHENERGAN Take 25 mg by mouth every 6 (six) hours as needed for nausea or vomiting.   rivaroxaban 20 MG Tabs tablet Commonly known as:  XARELTO Take 1 tablet (20 mg total) by mouth daily with supper.   sertraline 100 MG tablet Commonly known as:  ZOLOFT Take 0.5 tablets (50 mg total) by mouth daily.  SUMAtriptan 50 MG tablet Commonly known as:  IMITREX One tablet by mouth at start of migraine.  May repeat in 2 hours if no improvement x 1 dose.          Objective:   Physical Exam BP 118/78 (BP Location: Left Arm, Patient Position: Sitting, Cuff Size: Normal)   Pulse 91   Temp 98.1 F (36.7 C) (Oral)   Resp 14   Ht  (1.626 m)   Wt 232 lb 2 oz (105.3 kg)   SpO2 98%   BMI 39.84 kg/m  General:   Well developed, obese appearing, resting at the examining table in a dark room, mild distress but not toxic appearing.  HEENT:  Normocephalic . Face symmetric, atraumatic. Nose not congested Neck: Full range of motion Lungs:  CTA B Normal respiratory effort, no intercostal retractions, no accessory muscle use. Heart: RRR,  no murmur.  No pretibial edema bilaterally  Skin: Not pale. Not jaundice Neurologic:  alert & oriented X3.  Speech normal, gait appropriate for age and unassisted. Motor symmetric, EOMI, pupils equal and reactive Psych--  Cognition and judgment appear  intact.  Cooperative with normal attention span and concentration.  Behavior appropriate. No anxious or depressed appearing.      Assessment & Plan:   Assessment Primary hypercoagulable state, change Coumadin to Xarelto 05-2014 PE 2002 while on HRT, saw Dr Cyndie Chime ;+ FH, testing on her was (-);  rx coumadin since 2002 for life  superficial phlebitis 11-2014 Elevated BP Hyperlipidemia-- Pravachol, simvastatin caused aches  Depression, insomnia: self dc lamictal ~ 08-2014 , on xanax rx by pcp Migraine headaches-- f/u by pcp  IBS  OSA-- + test 2012, [facility]),repeat sleep study + 2017, on CPAP as off 03-2016 Vit B12 and Vit D def-- poor compliance w/ supplements Chronic pain syndrome ---hydrocodone Rx per Dr. Ethelene Hal Headaches: on imitrex per pcp Used to see Dr. Sandria Manly, S/P Topamax, BB   PLAN Persistent migraine headache: Patient presents with headache for 4 days, features similar to previous migraines but reportedly the worst of her life (or close to it). She is on Xarelto. I recommend  further eval including a CT,IV fluids, IV Phenergan or Zofran to allow p.o. medication. Discussed with the ER doctor who is willing to take the patient.

## 2016-07-09 NOTE — Progress Notes (Signed)
Pre visit review using our clinic review tool, if applicable. No additional management support is needed unless otherwise documented below in the visit note. 

## 2016-07-09 NOTE — Patient Instructions (Signed)
Please go to the ER at the first floor, I already  talk with a doctor over there.

## 2016-07-09 NOTE — ED Notes (Signed)
Patient transported to CT 

## 2016-07-20 ENCOUNTER — Telehealth: Payer: Self-pay

## 2016-07-20 ENCOUNTER — Ambulatory Visit (INDEPENDENT_AMBULATORY_CARE_PROVIDER_SITE_OTHER): Payer: BLUE CROSS/BLUE SHIELD | Admitting: Internal Medicine

## 2016-07-20 ENCOUNTER — Encounter: Payer: Self-pay | Admitting: Internal Medicine

## 2016-07-20 VITALS — BP 124/72 | HR 72 | Temp 98.1°F | Resp 14 | Ht 64.0 in | Wt 232.4 lb

## 2016-07-20 DIAGNOSIS — S01431A Puncture wound without foreign body of right cheek and temporomandibular area, initial encounter: Secondary | ICD-10-CM

## 2016-07-20 DIAGNOSIS — W540XXA Bitten by dog, initial encounter: Secondary | ICD-10-CM | POA: Diagnosis not present

## 2016-07-20 DIAGNOSIS — S01131A Puncture wound without foreign body of right eyelid and periocular area, initial encounter: Secondary | ICD-10-CM

## 2016-07-20 MED ORDER — AMOXICILLIN-POT CLAVULANATE 500-125 MG PO TABS: 1.0000 | ORAL_TABLET | Freq: Three times a day (TID) | ORAL | 0 refills | Status: DC

## 2016-07-20 NOTE — Progress Notes (Signed)
Subjective:    Patient ID: Leslie Rice, female    DOB: [birth year], 51 y.o.   MRN: [id number]  DOS:  07/20/2016 Type of visit - description : Acute visit Interval history: Yesterday, her neighbor's dog bite her in the face. It  was not necessarily an attack but she was walking very close to the neighbor's fence. The animal  has not been immunized according to the owners. Patient does not know if he exhibited signs of illness Dog was immediately taken under animal control. The pt's  face was swollen and red yesterday, there was some bleeding. She applied antibiotic ointments, this morning the face looks slightly better.   Review of Systems   Past Medical History:  Diagnosis Date  . ALLERGIC RHINITIS   . Chronic pain syndrome    thoracic back pain, seeing Dr. Ethelene Hal  . DEPRESSION   . ELEVATED BLOOD PRESSURE   . GERD   . HYPERLIPIDEMIA   . INSOMNIA   . Irritable bowel syndrome   . MIGRAINE HEADACHE   . OSA (obstructive sleep apnea)    not on Cpap @ present 11-2013  . Primary hypercoagulable state [facility])     Past Surgical History:  Procedure Laterality Date  . c section     x 2   . CARPAL TUNNEL RELEASE     B  . NASAL SINUS SURGERY      Social History   Social History  . Marital status: Single    Spouse name: N/A  . Number of children: 2  . Years of education: N/A   Occupational History  . IT for insurance     Social History Main Topics  . Smoking status: Never Smoker  . Smokeless tobacco: Never Used  . Alcohol use Yes     Comment: occ  . Drug use: No  . Sexual activity: Not on file   Other Topics Concern  . Not on file   Social History Narrative   2 daughters children live w/ her    One is in college       Allergies as of 07/20/2016      Reactions   Ciprofloxacin    REACTION: vomiting   Codeine    REACTION: vomiting   Sulfonamide Derivatives    hives      Medication List       Accurate as of 07/20/16  1:16 PM. Always use your most recent  med list.          ALPRAZolam 0.5 MG tablet Commonly known as:  XANAX Take 1 tablet (0.5 mg total) by mouth 2 (two) times daily as needed.   HYDROcodone-acetaminophen 10-325 MG tablet Commonly known as:  NORCO Take 1 tablet by mouth every 6 (six) hours as needed.   levonorgestrel 20 MCG/24HR IUD Commonly known as:  MIRENA 1 each by Intrauterine route once.   morphine 15 MG 12 hr tablet Commonly known as:  MS CONTIN Take 15 mg by mouth 2 (two) times daily.   promethazine 25 MG tablet Commonly known as:  PHENERGAN Take 25 mg by mouth every 6 (six) hours as needed for nausea or vomiting.   rivaroxaban 20 MG Tabs tablet Commonly known as:  XARELTO Take 1 tablet (20 mg total) by mouth daily with supper.   sertraline 100 MG tablet Commonly known as:  ZOLOFT Take 0.5 tablets (50 mg total) by mouth daily.   SUMAtriptan 50 MG tablet Commonly known as:  IMITREX One tablet by mouth at  start of migraine.  May repeat in 2 hours if no improvement x 1 dose.          Objective:   Physical Exam  HENT:  Head:     BP 124/72 (BP Location: Left Arm, Patient Position: Sitting, Cuff Size: Normal)   Pulse 72   Temp 98.1 F (36.7 C) (Oral)   Resp 14   Ht  (1.626 m)   Wt 232 lb 6 oz (105.4 kg)   SpO2 97%   BMI 39.89 kg/m  General:   Well developed, well nourished . NAD.   Skin: See picture and graphic; upper right cheek slightly red and warm. No dc noted Neurologic:  alert & oriented X3.  Speech normal, gait appropriate for age and unassisted Psych--  Cognition and judgment appear intact.  Cooperative with normal attention span and concentration.  Behavior appropriate. slt anxious, but no depressed appearing.        Assessment & Plan:   Assessment Primary hypercoagulable state, change Coumadin to Xarelto 05-2014 PE 2002 while on HRT, saw Dr Cyndie Chime ;+ FH, testing on her was (-);  rx coumadin since 2002 for life  superficial phlebitis 11-2014 Elevated  BP Hyperlipidemia-- Pravachol, simvastatin caused aches  Depression, insomnia: self dc lamictal ~ 08-2014 , on xanax rx by pcp Migraine headaches-- f/u by pcp  IBS  OSA-- + test 2012, [facility]),repeat sleep study + 2017, on CPAP as off 03-2016 Vit B12 and Vit D def-- poor compliance w/ supplements Chronic pain syndrome ---hydrocodone Rx per Dr. Ethelene Hal Headaches: on imitrex per pcp Used to see Dr. Sandria Manly, S/P Topamax, BB   PLAN Dog bite Bite by her neighbor's dog, apparently it was not immunized, he is under animal control care. Plan: Augmentin, call if redness increases, she is developing mild cellulitis. Definitely rabies prophylaxis if the animal exhibit any sign of illness in the next 10 days, patient aware, recommend to check with animal control daily or every 2 days.

## 2016-07-20 NOTE — Telephone Encounter (Signed)
Please advise 

## 2016-07-20 NOTE — Patient Instructions (Signed)
If the dog that bite you show any sign of illness in the next 10 days: Go to the ER immediately for rabies shots  Take the antibiotic, Augmentin 3 times a day for the next week  If the face gets more swollen, red, you have fever, chills: Call or go to the ER.

## 2016-07-20 NOTE — Progress Notes (Signed)
Pre visit review using our clinic review tool, if applicable. No additional management support is needed unless otherwise documented below in the visit note. 

## 2016-07-20 NOTE — Telephone Encounter (Signed)
Spoke w/ Pt, she was bitten on cheek/face near eye (denies loss of vision, blurred vision to eye) by Tommas Olp (neighbors). The dog has been quarantined to [facility] for several days for observation. Pt would not like to spend "thousands" more to go to ED or urgent care since her ED visit earlier this month. Recommend if that's the case to come in and let PCP at least check wound, Pt agreed, and will be in at 1. Last tetanus shot in 2015.

## 2016-07-20 NOTE — Telephone Encounter (Signed)
Was bitten by a dog yesterday the dog did not have its rabies shot.  Wants advice on she should get the rabies vaccine or not.   Please advise Call back number: [phone]

## 2016-07-20 NOTE — Telephone Encounter (Signed)
It all depends, I actually suggested to go to the [facility] urgent care  to discuss thoroughly the issue. I believe they do  have the capability to provide rabies shots if needed. Dog has to be watched . Be sure she has her Td update, she may need antibiotics for the wound.

## 2016-07-23 NOTE — Assessment & Plan Note (Signed)
Dog bite Bite by her neighbor's dog, apparently it was not immunized, he is under animal control care. Plan: Augmentin, call if redness increases, she is developing mild cellulitis. Definitely rabies prophylaxis if the animal exhibit any sign of illness in the next 10 days, patient aware, recommend to check with animal control daily or every 2 days.

## 2016-07-31 ENCOUNTER — Encounter: Payer: Self-pay | Admitting: Adult Health

## 2016-07-31 ENCOUNTER — Ambulatory Visit (INDEPENDENT_AMBULATORY_CARE_PROVIDER_SITE_OTHER): Payer: BLUE CROSS/BLUE SHIELD | Admitting: Adult Health

## 2016-07-31 VITALS — BP 122/80 | HR 72 | Ht 64.0 in | Wt 235.0 lb

## 2016-07-31 DIAGNOSIS — G4733 Obstructive sleep apnea (adult) (pediatric): Secondary | ICD-10-CM

## 2016-07-31 DIAGNOSIS — Z9989 Dependence on other enabling machines and devices: Secondary | ICD-10-CM | POA: Diagnosis not present

## 2016-07-31 NOTE — Progress Notes (Addendum)
PATIENT: Leslie Rice DOB: 11-22-1965  REASON FOR VISIT: follow up- OSA on CPAP HISTORY FROM: patient  HISTORY OF PRESENT ILLNESS: Leslie Rice is a 51 year old female with a history of obstructive sleep apnea on CPAP. She returns today for a compliance download. Her download indicates that she uses her machine 27 out of 30 days for compliance of 90%. She uses her machine greater than 4 hours 25 out of 30 days for compliance of 83%. Her average usage is 7 hours and 37 minutes. She is on AutoSet with minimum pressure of 5 cm water and maximum pressure 15 cm water with EPR of 3. Her residual AHI is 9.5 with no leak. The patient's average pressure is 14.8 cm of water. Looking at the patient's download she does not fluctuate pressure much but  rather stays around 15 all night. She states that she is wearing the nasal mask however her mask does tend to ride up into her eyes. As of now she states that she has not seen the benefit of using CPAP. She returns today for an evaluation.  HISTORY 10/20/15: Leslie Rice is a 51 y.o. female , seen here as a referral from Dr. Drue Novel for a sleep evaluation. Former patient of Dr Sandria Manly for [employer] migraines.  Leslie Rice reports that she was diagnosed with sleep apnea approximately 10 years ago, she actively pursued weight loss as treatment options and was successful for about 5 years has started to regain weight back. She is not back to her weight but was present at the time of diagnosis. Recently her sister had stayed with her at the house and witnessed her to snore heavily but also witnessed apneas again. Leslie Rice reported this to Dr. Darrin Nipper who would like her to be reevaluated for possible sleep apnea. Besides the weight gain she endorsed a decreased level of daytime energy, increased daytime sleepiness, and memory difficulties. She has a history of migraine and depression, pulmonary emboli-but she has family members affected by factor V Leiden she was tested  negative repeatedly. At the time of PE she was on hormonal birth control.  She was placed on Xeralto  and oxygen at night. Had also carpal tunnel surgery and C-section. She is a mother of 2 daughters 64 and 36.  Chief complaint according to patient : " snoring and pausing my breath "  Sleep habits are as follows: The patient has an early bedtime habits she usually goes to bed between 8 and 9 PM and she falls asleep promptly. She feels exhausted throughout the afternoon and early evening which leads to the early bedtime. She does  watch TV in bed, but goes to sleep nonetheless. She wakes up at least once when she goes to the bathroom, but usually goes back to sleep quickly. She sleeps on one pillow and usually on her left side and her bedroom is usually quiet and cool once she switches of the TV. She has to rise usually around 6:30 AM but often wakes up at 5 and sometimes wishes to sleep longer but can't. She has a very dry mouth at night and in the morning. Some mornings she will have a headache when waking, she has a headache today. There is no nausea with these sleep related headaches. She also has migraine , only once a month.   Her insurance did not pay for treatment of sleep apnea 10 years ago and she advised me that her insurance will change again on August 1 for  this reason I would like her to go into the sleep study ASAP.   Sleep medical history and family sleep history:  Sister has OSA on CPAP. All members snore , she is the youngest of 57. 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   Social history:  Divorced, 2 daughters, daytime    REVIEW OF SYSTEMS: Out of a complete 14 system review of symptoms, the patient complains only of the following symptoms, and all other reviewed systems are negative.  Apnea, frequent waking, daytime sleepiness, snoring, back pain, facial swelling  ALLERGIES: Allergies  Allergen Reactions  . Ciprofloxacin     REACTION: vomiting    . Codeine     REACTION: vomiting  . Sulfonamide Derivatives     hives    HOME MEDICATIONS: Outpatient Medications Prior to Visit  Medication Sig Dispense Refill  . ALPRAZolam (XANAX) 0.5 MG tablet Take 1 tablet (0.5 mg total) by mouth 2 (two) times daily as needed. 60 tablet 1  . HYDROcodone-acetaminophen (NORCO) 10-325 MG per tablet Take 1 tablet by mouth every 6 (six) hours as needed.    Marland Kitchen levonorgestrel (MIRENA) 20 MCG/24HR IUD 1 each by Intrauterine route once.    . morphine (MS CONTIN) 15 MG 12 hr tablet Take 15 mg by mouth 2 (two) times daily.    . promethazine (PHENERGAN) 25 MG tablet Take 25 mg by mouth every 6 (six) hours as needed for nausea or vomiting.    . rivaroxaban (XARELTO) 20 MG TABS tablet Take 1 tablet (20 mg total) by mouth daily with supper. 30 tablet 5  . sertraline (ZOLOFT) 100 MG tablet Take 0.5 tablets (50 mg total) by mouth daily. 15 tablet 5  . SUMAtriptan (IMITREX) 50 MG tablet One tablet by mouth at start of migraine.  May repeat in 2 hours if no improvement x 1 dose. 10 tablet 2  . amoxicillin-clavulanate (AUGMENTIN) 500-125 MG tablet Take 1 tablet (500 mg total) by mouth 3 (three) times daily. 21 tablet 0   No facility-administered medications prior to visit.     PAST MEDICAL HISTORY: Past Medical History:  Diagnosis Date  . ALLERGIC RHINITIS   . Chronic pain syndrome    thoracic back pain, seeing Dr. Ethelene Hal  . DEPRESSION   . ELEVATED BLOOD PRESSURE   . GERD   . HYPERLIPIDEMIA   . INSOMNIA   . Irritable bowel syndrome   . MIGRAINE HEADACHE   . OSA (obstructive sleep apnea)    not on Cpap @ present 11-2013  . Primary hypercoagulable state (HCC)     PAST SURGICAL HISTORY: Past Surgical History:  Procedure Laterality Date  . c section     x 2   . CARPAL TUNNEL RELEASE     B  . NASAL SINUS SURGERY      FAMILY HISTORY: Family History  Problem Relation Age of Onset  . Colon cancer Other     M, aunt, nephew  . CAD Father     age?  "silent MIs"  . Lung cancer Father   . Diabetes Father     F, sisters x2  . Thyroid cancer Sister   . Colon cancer Sister     dx age 82  . Breast cancer Neg Hx   . Stroke Neg Hx     SOCIAL HISTORY: Social History   Social History  . Marital status: Single    Spouse name: N/A  . Number of children: 2  . Years of  education: N/A   Occupational History  . IT for insurance     Social History Main Topics  . Smoking status: Never Smoker  . Smokeless tobacco: Never Used  . Alcohol use Yes     Comment: occ  . Drug use: No  . Sexual activity: Not on file   Other Topics Concern  . Not on file   Social History Narrative   2 daughters children live w/ her    One is in college       PHYSICAL EXAM  Vitals:   07/31/16 0741  BP: 122/80  Pulse: 72  Weight: 235 lb (106.6 kg)  Height:  (1.626 m)   Body mass index is 40.34 kg/m.  Generalized: Well developed, in no acute distress   Neurological examination  Mentation: Alert oriented to time, place, history taking. Follows all commands speech and language fluent Cranial nerve II-XII: Pupils were equal round reactive to light. Extraocular movements were full, visual field were full on confrontational test. Facial sensation and strength were normal. Uvula tongue midline. Head turning and shoulder shrug  were normal and symmetric.Mallampati 3+ Motor: The motor testing reveals 5 over 5 strength of all 4 extremities. Good symmetric motor tone is noted throughout.  Sensory: Sensory testing is intact to soft touch on all 4 extremities. No evidence of extinction is noted.  Gait and station: Gait is normal.  Reflexes: Deep tendon reflexes are symmetric and normal bilaterally.   DIAGNOSTIC DATA (LABS, IMAGING, TESTING) - I reviewed patient records, labs, notes, testing and imaging myself where available.  Lab Results  Component Value Date   WBC 5.8 04/08/2015   HGB 14.2 04/08/2015   HCT 41.6 04/08/2015   MCV 92.8 04/08/2015    PLT 214.0 04/08/2015      Component Value Date/Time   NA 139 04/08/2015 1137   K 3.9 04/08/2015 1137   CL 107 04/08/2015 1137   CO2 25 04/08/2015 1137   GLUCOSE 78 04/08/2015 1137   BUN 10 04/08/2015 1137   CREATININE 0.79 04/08/2015 1137   CALCIUM 9.0 04/08/2015 1137   AST 20 11/24/2013 0843   ALT 15 11/24/2013 0843   Lab Results  Component Value Date   CHOL 229 (H) 09/30/2015   HDL 41.70 09/30/2015   LDLDIRECT 119.0 09/30/2015   TRIG 248.0 (H) 09/30/2015   CHOLHDL 5 09/30/2015   No results found for: HGBA1C Lab Results  Component Value Date   VITAMINB12 353 04/08/2015   Lab Results  Component Value Date   TSH 2.42 09/30/2015      ASSESSMENT AND PLAN 51 y.o. year old female  has a past medical history of ALLERGIC RHINITIS; Chronic pain syndrome; DEPRESSION; ELEVATED BLOOD PRESSURE; GERD; HYPERLIPIDEMIA; INSOMNIA; Irritable bowel syndrome; MIGRAINE HEADACHE; OSA (obstructive sleep apnea); and Primary hypercoagulable state (HCC). here with:  1. Obstructive sleep apnea on CPAP  I consulted with Dr. Frances Furbish- at this time we will recommend that the patient go on a set pressure 15 cm of water. She will also need a mask refitting. She can either make an appointment with our sleep lab or with  her DME company. She will return in 3 months with Dr. Vickey Huger for a repeat download. Patient is amenable to this plan.  I spent 15 minutes with the patient 50% of this time was spent reviewing the patient's CPAP download.  Leslie Penny, MSN, NP-C 07/31/2016, 8:34 AM [facility] [address] [place], [place] [address] [phone]  I reviewed  the above note and documentation by the Nurse Practitioner and agree with the history, physical exam, assessment and plan as outlined above. I was immediately available for face-to-face consultation. Star Age, MD, PhD [facility])

## 2016-07-31 NOTE — Patient Instructions (Signed)
Change to set pressure of 15 cm H20 Zella Ball will reach out to you regarding mask If your symptoms worsen or you develop new symptoms please let us know.

## 2016-08-07 ENCOUNTER — Telehealth: Payer: Self-pay | Admitting: Internal Medicine

## 2016-08-07 NOTE — Telephone Encounter (Signed)
Caller name:Dawsyn Tipping Relationship to patient: Can be reached:[phone] Pharmacy:  Reason for call:Requesting samples of xarelto , price of medicine has gone up to over $200, patient is unable to afford that. Please advise

## 2016-08-07 NOTE — Telephone Encounter (Signed)
Spoke w/ Pt, 28 tabs of Xarelto  placed at front desk for pick up, also placed savings coupon w/ samples which should help Pt w/ cost.

## 2016-09-12 ENCOUNTER — Ambulatory Visit (INDEPENDENT_AMBULATORY_CARE_PROVIDER_SITE_OTHER): Payer: BLUE CROSS/BLUE SHIELD | Admitting: Internal Medicine

## 2016-09-12 ENCOUNTER — Encounter: Payer: Self-pay | Admitting: Internal Medicine

## 2016-09-12 ENCOUNTER — Telehealth: Payer: Self-pay | Admitting: Internal Medicine

## 2016-09-12 VITALS — BP 128/78 | HR 76 | Temp 97.8°F | Resp 14 | Ht 64.0 in | Wt 234.2 lb

## 2016-09-12 DIAGNOSIS — L237 Allergic contact dermatitis due to plants, except food: Secondary | ICD-10-CM

## 2016-09-12 DIAGNOSIS — G4700 Insomnia, unspecified: Secondary | ICD-10-CM

## 2016-09-12 MED ORDER — PREDNISONE 10 MG PO TABS: ORAL_TABLET | ORAL | 0 refills | Status: DC

## 2016-09-12 MED ORDER — BETAMETHASONE DIPROPIONATE AUG 0.05 % EX CREA: TOPICAL_CREAM | Freq: Two times a day (BID) | CUTANEOUS | 0 refills | Status: DC

## 2016-09-12 NOTE — Progress Notes (Signed)
Pre visit review using our clinic review tool, if applicable. No additional management support is needed unless otherwise documented below in the visit note. 

## 2016-09-12 NOTE — Telephone Encounter (Signed)
Spoke w/ PCP-symptom onset is very quick to be ointment, nevertheless, d/c Diprolene, continue prednisone as prescribed, can use OTC hydrocortisone, benadryl. Pt informed of recommendations. She is to let us know if sx's not improving.

## 2016-09-12 NOTE — Progress Notes (Signed)
Subjective:    Patient ID: Leslie Rice, female    DOB: February 22, 1966, 51 y.o.   MRN: [id number]  DOS:  09/12/2016 Type of visit - description : Acute Interval history: A week ago developed extremely itchy rash few days after she pulled some weeds of her yard. OTCs not helping. The area is extremely itchy.  Review of Systems No fever chills No headaches No unusual myalgias  Past Medical History:  Diagnosis Date  . ALLERGIC RHINITIS   . Chronic pain syndrome    thoracic back pain, seeing Dr. Ethelene Hal  . DEPRESSION   . ELEVATED BLOOD PRESSURE   . GERD   . HYPERLIPIDEMIA   . INSOMNIA   . Irritable bowel syndrome   . MIGRAINE HEADACHE   . OSA (obstructive sleep apnea)    not on Cpap @ present 11-2013  . Primary hypercoagulable state [facility])     Past Surgical History:  Procedure Laterality Date  . c section     x 2   . CARPAL TUNNEL RELEASE     B  . NASAL SINUS SURGERY      Social History   Social History  . Marital status: Single    Spouse name: N/A  . Number of children: 2  . Years of education: N/A   Occupational History  . IT for insurance     Social History Main Topics  . Smoking status: Never Smoker  . Smokeless tobacco: Never Used  . Alcohol use Yes     Comment: occ  . Drug use: No  . Sexual activity: Not on file   Other Topics Concern  . Not on file   Social History Narrative   2 daughters children live w/ her    One is in college       Allergies as of 09/12/2016      Reactions   Ciprofloxacin    REACTION: vomiting   Codeine    REACTION: vomiting   Sulfonamide Derivatives    hives      Medication List       Accurate as of 09/12/16  6:42 PM. Always use your most recent med list.          ALPRAZolam 0.5 MG tablet Commonly known as:  XANAX Take 1 tablet (0.5 mg total) by mouth 2 (two) times daily as needed.   HYDROcodone-acetaminophen 10-325 MG tablet Commonly known as:  NORCO Take 1 tablet by mouth every 6 (six) hours as needed.     levonorgestrel 20 MCG/24HR IUD Commonly known as:  MIRENA 1 each by Intrauterine route once.   morphine 15 MG 12 hr tablet Commonly known as:  MS CONTIN Take 15 mg by mouth 2 (two) times daily.   predniSONE 10 MG tablet Commonly known as:  DELTASONE 4 tablets x 2 days, 3 tabs x 2 days, 2 tabs x 2 days, 1 tab x 2 days   promethazine 25 MG tablet Commonly known as:  PHENERGAN Take 25 mg by mouth every 6 (six) hours as needed for nausea or vomiting.   rivaroxaban 20 MG Tabs tablet Commonly known as:  XARELTO Take 1 tablet (20 mg total) by mouth daily with supper.   sertraline 100 MG tablet Commonly known as:  ZOLOFT Take 0.5 tablets (50 mg total) by mouth daily.   SUMAtriptan 50 MG tablet Commonly known as:  IMITREX One tablet by mouth at start of migraine.  May repeat in 2 hours if no improvement x 1 dose.  Objective:   Physical Exam BP 128/78 (BP Location: Left Arm, Patient Position: Sitting, Cuff Size: Normal)   Pulse 76   Temp 97.8 F (36.6 C) (Oral)   Resp 14   Ht 5\' 4"  (1.626 m)   Wt 234 lb 4 oz (106.3 kg)   SpO2 96%   BMI 40.21 kg/m  General:   Well developed, well nourished . NAD.  HEENT:  Normocephalic . Face symmetric, atraumatic Skin:   Rash at the R hand between the fingers, see picture Rash at the left supraclavicular area, see picture Eyelids also slightly red and swollen, no blisters, worse on the right. No picture. Neurologic:  alert & oriented X3.  Speech normal, gait appropriate for age and unassisted Psych--  Cognition and judgment appear intact.  Cooperative with normal attention span and concentration.  Behavior appropriate. No anxious or depressed appearing.           Assessment & Plan:    Assessment Primary hypercoagulable state, change Coumadin to Xarelto 05-2014 PE 2002 while on HRT, saw Dr Cyndie ChimeGranfortuna ;+ FH, testing on her was (-);  rx coumadin since 2002 for life  superficial phlebitis 11-2014 Elevated  BP Hyperlipidemia-- Pravachol, simvastatin caused aches  Depression, insomnia: self dc lamictal ~ 08-2014 , on xanax rx by pcp Migraine headaches-- f/u by pcp  IBS  OSA-- + test 2012, [facility]),repeat sleep study + 2017, on CPAP as off 03-2016 Vit B12 and Vit D def-- poor compliance w/ supplements Chronic pain syndrome ---hydrocodone Rx per Dr. Ethelene Halamos Headaches: on imitrex per pcp Used to see Dr. Sandria ManlyLove, S/P Topamax, BB   PLAN Poison ivy dermatitis: Round of steroids, continue Benadryl, topical steroids, avoidance, wash clothing. See information (AVS) Insomnia: on Xanax, contract and a UDS today. Hypercoagulable state: Samples and a coupon  for Xarelto today

## 2016-09-12 NOTE — Addendum Note (Signed)
Addended byConrad Kent: Kensey Luepke D on: 09/12/2016 01:05 PM   Modules accepted: Orders

## 2016-09-12 NOTE — Patient Instructions (Addendum)
Go to the lab before you leave: UDS   Take prednisone as prescribed  Continue Benadryl as needed for itching  Use the steroid ointment  Wash all your clothes    Poison Ivy Dermatitis Poison ivy dermatitis is redness and soreness (inflammation) of the skin. It is caused by a chemical that is found on the leaves of the poison ivy plant. You may also have itching, a rash, and blisters. Symptoms often clear up in 1-2 weeks. You may get this condition by touching a poison ivy plant. You can also get it by touching something that has the chemical on it. This may include animals or objects that have come in contact with the plant. Follow these instructions at home: General instructions  Take or apply over-the-counter and prescription medicines only as told by your doctor.  If you touch poison ivy, wash your skin with soap and cold water right away.  Use hydrocortisone creams or calamine lotion as needed to help with itching.  Take oatmeal baths as needed. Use colloidal oatmeal. You can get this at a pharmacy or grocery store. Follow the instructions on the package.  Do not scratch or rub your skin.  While you have the rash, wash your clothes right after you wear them. Prevention  Know what poison ivy looks like so you can avoid it. This plant has three leaves with flowering branches on a single stem. The leaves are glossy. They have uneven edges that come to a point at the front.  If you have touched poison ivy, wash with soap and water right away. Be sure to wash under your fingernails.  When hiking or camping, wear long pants, a long-sleeved shirt, tall socks, and hiking boots. You can also use a lotion on your skin that helps to prevent contact with the chemical on the plant.  If you think that your clothes or outdoor gear came in contact with poison ivy, rinse them off with a garden hose before you bring them inside your house. Contact a doctor if:  You have open sores in the  rash area.  You have more redness, swelling, or pain in the affected area.  You have redness that spreads beyond the rash area.  You have fluid, blood, or pus coming from the affected area.  You have a fever.  You have a rash over a large area of your body.  You have a rash on your eyes, mouth, or genitals.  Your rash does not get better after a few days. Get help right away if:  Your face swells or your eyes swell shut.  You have trouble breathing.  You have trouble swallowing. This information is not intended to replace advice given to you by your health care provider. Make sure you discuss any questions you have with your health care provider. Document Released: 04/28/2010 Document Revised: 09/01/2015 Document Reviewed: 09/01/2014 Elsevier Interactive Patient Education  [employer]2018 [employer] Inc[employer]

## 2016-09-12 NOTE — Telephone Encounter (Signed)
Pt had bad reaction to ointment prescribed today. Swollen red feels like on fire. Please advise

## 2016-09-12 NOTE — Assessment & Plan Note (Signed)
Poison ivy dermatitis: Round of steroids, continue Benadryl, topical steroids, avoidance, wash clothing. See information (AVS) Insomnia: on Xanax, contract and a UDS today. Hypercoagulable state: Samples and a coupon  for Xarelto today

## 2016-09-18 ENCOUNTER — Other Ambulatory Visit: Payer: Self-pay | Admitting: Internal Medicine

## 2016-09-20 ENCOUNTER — Encounter: Payer: BLUE CROSS/BLUE SHIELD | Admitting: Internal Medicine

## 2016-09-21 ENCOUNTER — Other Ambulatory Visit: Payer: Self-pay | Admitting: Internal Medicine

## 2016-09-21 DIAGNOSIS — G43009 Migraine without aura, not intractable, without status migrainosus: Secondary | ICD-10-CM

## 2016-10-02 ENCOUNTER — Encounter: Payer: BLUE CROSS/BLUE SHIELD | Admitting: Internal Medicine

## 2016-10-05 ENCOUNTER — Telehealth: Payer: Self-pay

## 2016-10-05 NOTE — Telephone Encounter (Signed)
UDS: 09/14/2016  Alprazolam: Not detected: PRN Hydrocodone: detected: prescribed by Dr. Ethelene Halamos Morphine: detected: prescribed by Dr. Ethelene Halamos   Low risk per PCP 10/05/2016

## 2016-10-09 ENCOUNTER — Other Ambulatory Visit: Payer: Self-pay | Admitting: Internal Medicine

## 2016-10-31 ENCOUNTER — Ambulatory Visit: Payer: BLUE CROSS/BLUE SHIELD | Admitting: Neurology

## 2016-11-05 ENCOUNTER — Ambulatory Visit (INDEPENDENT_AMBULATORY_CARE_PROVIDER_SITE_OTHER): Payer: BLUE CROSS/BLUE SHIELD | Admitting: Internal Medicine

## 2016-11-05 ENCOUNTER — Encounter: Payer: Self-pay | Admitting: Internal Medicine

## 2016-11-05 VITALS — BP 134/80 | HR 69 | Temp 97.9°F | Resp 14 | Ht 64.0 in | Wt 234.1 lb

## 2016-11-05 DIAGNOSIS — Z1231 Encounter for screening mammogram for malignant neoplasm of breast: Secondary | ICD-10-CM | POA: Diagnosis not present

## 2016-11-05 DIAGNOSIS — Z0000 Encounter for general adult medical examination without abnormal findings: Secondary | ICD-10-CM | POA: Diagnosis not present

## 2016-11-05 DIAGNOSIS — R7989 Other specified abnormal findings of blood chemistry: Secondary | ICD-10-CM

## 2016-11-05 LAB — CBC WITH DIFFERENTIAL/PLATELET
BASOS PCT: 0.3 % (ref 0.0–3.0)
Basophils Absolute: 0 10*3/uL (ref 0.0–0.1)
EOS ABS: 0.1 10*3/uL (ref 0.0–0.7)
EOS PCT: 2.3 % (ref 0.0–5.0)
HEMATOCRIT: 40.7 % (ref 36.0–46.0)
Hemoglobin: 14.1 g/dL (ref 12.0–15.0)
Lymphocytes Relative: 41.5 % (ref 12.0–46.0)
Lymphs Abs: 2 10*3/uL (ref 0.7–4.0)
MCHC: 34.7 g/dL (ref 30.0–36.0)
MCV: 92.7 fl (ref 78.0–100.0)
MONO ABS: 0.3 10*3/uL (ref 0.1–1.0)
Monocytes Relative: 5.7 % (ref 3.0–12.0)
Neutro Abs: 2.4 10*3/uL (ref 1.4–7.7)
Neutrophils Relative %: 50.2 % (ref 43.0–77.0)
PLATELETS: 205 10*3/uL (ref 150.0–400.0)
RBC: 4.39 Mil/uL (ref 3.87–5.11)
RDW: 12.4 % (ref 11.5–15.5)
WBC: 4.7 10*3/uL (ref 4.0–10.5)

## 2016-11-05 LAB — LDL CHOLESTEROL, DIRECT: Direct LDL: 122 mg/dL

## 2016-11-05 LAB — LIPID PANEL
CHOLESTEROL: 229 mg/dL — AB (ref 0–200)
HDL: 41.7 mg/dL (ref >39.00)
NonHDL: 187.39
Total CHOL/HDL Ratio: 5
Triglycerides: 286 mg/dL — ABNORMAL HIGH (ref 0.0–149.0)
VLDL: 57.2 mg/dL — AB (ref 0.0–40.0)

## 2016-11-05 LAB — COMPREHENSIVE METABOLIC PANEL
ALBUMIN: 4.3 g/dL (ref 3.5–5.2)
ALT: 12 U/L (ref 0–35)
AST: 15 U/L (ref 0–37)
Alkaline Phosphatase: 58 U/L (ref 39–117)
BUN: 9 mg/dL (ref 6–23)
CO2: 26 mEq/L (ref 19–32)
Calcium: 9.3 mg/dL (ref 8.4–10.5)
Chloride: 105 mEq/L (ref 96–112)
Creatinine, Ser: 0.75 mg/dL (ref 0.40–1.20)
GFR: 87.37 mL/min (ref >60.00)
Glucose, Bld: 75 mg/dL (ref 70–99)
POTASSIUM: 4.5 meq/L (ref 3.5–5.1)
SODIUM: 139 meq/L (ref 135–145)
Total Bilirubin: 0.9 mg/dL (ref 0.2–1.2)
Total Protein: 7 g/dL (ref 6.0–8.3)

## 2016-11-05 NOTE — Progress Notes (Signed)
Pre visit review using our clinic review tool, if applicable. No additional management support is needed unless otherwise documented below in the visit note. 

## 2016-11-05 NOTE — Assessment & Plan Note (Addendum)
-  Td: 602015 -CCS  +FH Sister age 49   dx w/ colon cancer  . Mother and aunt had colon ca age dx in their 5570s Pt had a cscope at age 51, normal @ [facility] S/p  cscope #2 on  03-2015, no report, wnl per pt  -female care:  Due to see gyn, plans to call  [facility] 2015 neg >> referral sent   -Diet-exercise discussed . Calorie counting? -Labs: CMP, FLP, CBC

## 2016-11-05 NOTE — Assessment & Plan Note (Signed)
Here for a CPX Hypercoagulable state: On Xarelto. Hyperlipidemia: Diet control, check labs Morbidly obese: Diet and exercise discussed. Depression and insomnia: On Xanax as needed. RF prn OSA: good cpap compliance RTC 1 year

## 2016-11-05 NOTE — Progress Notes (Signed)
Subjective:    Patient ID: Leslie Rice, female    DOB: 1965/06/09, 51 y.o.   MRN: [id number]  DOS:  11/05/2016 Type of visit - description : cpx Interval history: feeling well Occasionally has lower extremity edema, she thinks related to increase salt intake.    Review of Systems  Other than above, a 14 point review of systems is negative     Past Medical History:  Diagnosis Date  . ALLERGIC RHINITIS   . Chronic pain syndrome    thoracic back pain, seeing Dr. Ethelene Halamos  . DEPRESSION   . ELEVATED BLOOD PRESSURE   . GERD   . HYPERLIPIDEMIA   . INSOMNIA   . Irritable bowel syndrome   . MIGRAINE HEADACHE   . OSA (obstructive sleep apnea)    not on Cpap @ present 11-2013  . Primary hypercoagulable state [facility](HCC)     Past Surgical History:  Procedure Laterality Date  . c section     x 2   . CARPAL TUNNEL RELEASE     B  . NASAL SINUS SURGERY      Social History   Social History  . Marital status: Single    Spouse name: N/A  . Number of children: 2  . Years of education: N/A   Occupational History  . IT for insurance     Social History Main Topics  . Smoking status: Never Smoker  . Smokeless tobacco: Never Used  . Alcohol use Yes     Comment: occ  . Drug use: No  . Sexual activity: Not on file   Other Topics Concern  . Not on file   Social History Narrative   1 daughter live w/ her    One is in college      Family History  Problem Relation Age of Onset  . Colon cancer Other        M, aunt, nephew  . CAD Father        age? "silent MIs"  . Lung cancer Father   . Diabetes Father        F, sisters x2  . Thyroid cancer Sister   . Colon cancer Sister        dx age 49  . Breast cancer Neg Hx   . Stroke Neg Hx      Allergies as of 11/05/2016      Reactions   Ciprofloxacin    REACTION: vomiting   Codeine    REACTION: vomiting   Sulfonamide Derivatives    hives      Medication List       Accurate as of 11/05/16  9:31 PM. Always use your  most recent med list.          ALPRAZolam 0.5 MG tablet Commonly known as:  XANAX Take 1 tablet (0.5 mg total) by mouth 2 (two) times daily as needed.   HYDROcodone-acetaminophen 10-325 MG tablet Commonly known as:  NORCO Take 1 tablet by mouth every 6 (six) hours as needed.   levonorgestrel 20 MCG/24HR IUD Commonly known as:  MIRENA 1 each by Intrauterine route once.   morphine 15 MG 12 hr tablet Commonly known as:  MS CONTIN Take 15 mg by mouth 2 (two) times daily.   promethazine 25 MG tablet Commonly known as:  PHENERGAN Take 25 mg by mouth every 6 (six) hours as needed for nausea or vomiting.   rivaroxaban 20 MG Tabs tablet Commonly known as:  XARELTO Take 1 tablet (20  mg total) by mouth daily with supper.   sertraline 100 MG tablet Commonly known as:  ZOLOFT Take 0.5 tablets (50 mg total) by mouth daily.   SUMAtriptan 50 MG tablet Commonly known as:  IMITREX One tablet by mouth at start of migraine.  May repeat in 2 hours if no improvement x 1 dose.          Objective:   Physical Exam BP 134/80 (BP Location: Left Arm, Patient Position: Sitting, Cuff Size: Normal)   Pulse 69   Temp 97.9 F (36.6 C) (Oral)   Resp 14   Ht 5\' 4"  (1.626 m)   Wt 234 lb 2 oz (106.2 kg)   SpO2 98%   BMI 40.19 kg/m   General:   Well developed, morbidly obese appearing NAD.  Neck: No  thyromegaly  HEENT:  Normocephalic . Face symmetric, atraumatic Lungs:  CTA B Normal respiratory effort, no intercostal retractions, no accessory muscle use. Heart: RRR,  no murmur.  No pretibial edema bilaterally  Abdomen:  Not distended, soft, non-tender. No rebound or rigidity.   Skin: Exposed areas without rash. Not pale. Not jaundice Neurologic:  alert & oriented X3.  Speech normal, gait appropriate for age and unassisted Strength symmetric and appropriate for age.  Psych: Cognition and judgment appear intact.  Cooperative with normal attention span and concentration.  Behavior  appropriate. No anxious or depressed appearing.    Assessment & Plan:    Assessment Primary hypercoagulable state, change Coumadin to Xarelto 05-2014 PE 2002 while on HRT, saw Dr Cyndie ChimeGranfortuna ;+ FH, testing on her was (-);  rx coumadin since 2002 for life  superficial phlebitis 11-2014 Elevated BP Hyperlipidemia-- Pravachol, simvastatin caused aches  Morbidly obese Depression, insomnia: self dc lamictal ~ 08-2014 , on xanax rx by pcp Migraine headaches-- f/u by pcp  IBS  OSA-- + test 201 (, [facility]),repeat sleep study + 2017, on CPAP as off 03-2016 Vit B12 and Vit D def-- poor compliance w/ supplements Chronic pain syndrome ---hydrocodone Rx per Dr. Ethelene Halamos Headaches: on imitrex per pcp Used to see Dr. Sandria ManlyLove, S/P Topamax, BB Birth Control--- IUD  PLAN Here for a CPX Hypercoagulable state: On Xarelto. Hyperlipidemia: Diet control, check labs Morbidly obese: Diet and exercise discussed. Depression and insomnia: On Xanax as needed. RF prn OSA: good cpap compliance RTC 1 year

## 2016-11-05 NOTE — Patient Instructions (Signed)
GO TO THE LAB : Get the blood work     GO TO THE FRONT DESK Schedule your next appointment for a  complete physical exam in one year   The American Heart Association is an excellent source for free information regards diet  Calorie counting?

## 2016-11-12 ENCOUNTER — Encounter (HOSPITAL_BASED_OUTPATIENT_CLINIC_OR_DEPARTMENT_OTHER): Payer: Self-pay

## 2016-11-12 ENCOUNTER — Ambulatory Visit (HOSPITAL_BASED_OUTPATIENT_CLINIC_OR_DEPARTMENT_OTHER)
Admission: RE | Admit: 2016-11-12 | Discharge: 2016-11-12 | Disposition: A | Discharge status: Home / Self Care | Payer: 59 | Source: Ambulatory Visit | Attending: Internal Medicine | Admitting: Internal Medicine

## 2016-11-12 DIAGNOSIS — Z1231 Encounter for screening mammogram for malignant neoplasm of breast: Secondary | ICD-10-CM | POA: Diagnosis present

## 2016-12-06 ENCOUNTER — Encounter: Payer: Self-pay | Admitting: Medical

## 2016-12-06 ENCOUNTER — Ambulatory Visit (INDEPENDENT_AMBULATORY_CARE_PROVIDER_SITE_OTHER): Payer: 59 | Admitting: Medical

## 2016-12-06 VITALS — BP 120/88 | HR 107 | Temp 98.0°F | Ht 63.0 in | Wt 225.0 lb

## 2016-12-06 DIAGNOSIS — R51 Headache: Secondary | ICD-10-CM | POA: Diagnosis not present

## 2016-12-06 DIAGNOSIS — R519 Headache, unspecified: Secondary | ICD-10-CM

## 2016-12-06 MED ORDER — PROMETHAZINE HCL 25 MG/ML IJ SOLN
25.0000 mg | Freq: Once | INTRAMUSCULAR | Status: AC
  Administered 2016-12-06: 25 mg via INTRAMUSCULAR

## 2016-12-06 MED ORDER — NEOMYCIN-POLYMYXIN-HC 1 % OT SOLN: 3.0000 [drp] | Freq: Four times a day (QID) | OTIC | 0 refills | Status: DC

## 2016-12-06 MED ORDER — BUTALBITAL-APAP-CAFFEINE 50-325-40 MG PO TABS: ORAL_TABLET | ORAL | 0 refills | Status: DC

## 2016-12-06 MED ORDER — PROMETHAZINE HCL 25 MG PO TABS: 25.0000 mg | ORAL_TABLET | Freq: Four times a day (QID) | ORAL | 0 refills | Status: DC | PRN

## 2016-12-06 NOTE — Progress Notes (Signed)
Subjective:    Patient ID: Leslie Rice, female    DOB: 08/07/1965, 51 y.o.   MRN: [id number]  HPI  Pt in states she has ha for about one week. Pt states she has been having ha for a while on and off. She states frequent patten of 3-4 days of ha then 2 days of break from HA for past 3 months.   Pt went to ED in the spring. In that note she was supposed to follow up with neurologist but she states did not follow up. Pt had CT of head in April and was negative only showing left sphenoid sinus retention cyst.   Pt ha over last week is light and sound sensitive with nausea. Pt states imitrex does help. She states only on 50 mg dose. She states helps a little. When she tries to swollow her phenergan tab will vomit.  A week ago when her ha started she went to sleep with ha then next morning when woke up had worse ha. But with described migraine features.  Pt has been taking. MS contin for back pain and norco for breakthrough pain. But she thinks not keeping down tabs since vomiting.  On review HA left side. No gross motor or sensory function deficits.   Pt sees [place] neurology in the past.  Pt has coworker who is coming to pick her up.     Review of Systems  HENT: Positive for ear pain.        Mild rt ear pain on Monday when got water in and hard to get out.   Eyes: Positive for photophobia. Negative for discharge and redness.  Gastrointestinal: Positive for nausea and vomiting. Negative for abdominal distention and abdominal pain.  Neurological: Positive for headaches. Negative for dizziness, seizures, syncope, facial asymmetry, speech difficulty, weakness, light-headedness and numbness.  Hematological: Negative for adenopathy. Does not bruise/bleed easily.  Psychiatric/Behavioral: Negative for behavioral problems, confusion and sleep disturbance. The patient is not nervous/anxious.    Past Medical History:  Diagnosis Date  . ALLERGIC RHINITIS   . Chronic pain syndrome    thoracic back pain, seeing Dr. Ethelene Halamos  . DEPRESSION   . ELEVATED BLOOD PRESSURE   . GERD   . HYPERLIPIDEMIA   . INSOMNIA   . Irritable bowel syndrome   . MIGRAINE HEADACHE   . OSA (obstructive sleep apnea)    not on Cpap @ present 11-2013  . Primary hypercoagulable state [facility](HCC)      Social History   Social History  . Marital status: Single    Spouse name: N/A  . Number of children: 2  . Years of education: N/A   Occupational History  . IT for insurance     Social History Main Topics  . Smoking status: Never Smoker  . Smokeless tobacco: Never Used  . Alcohol use Yes     Comment: occ  . Drug use: No  . Sexual activity: Not on file   Other Topics Concern  . Not on file   Social History Narrative   1 daughter live w/ her    One is in college     Past Surgical History:  Procedure Laterality Date  . c section     x 2   . CARPAL TUNNEL RELEASE     B  . NASAL SINUS SURGERY      Family History  Problem Relation Age of Onset  . Colon cancer Other        M,  aunt, nephew  . CAD Father        age? "silent MIs"  . Lung cancer Father   . Diabetes Father        F, sisters x2  . Thyroid cancer Sister   . Colon cancer Sister        dx age 86  . Breast cancer Neg Hx   . Stroke Neg Hx     Allergies  Allergen Reactions  . Ciprofloxacin     REACTION: vomiting  . Codeine     REACTION: vomiting  . Sulfonamide Derivatives     hives    Current Outpatient Prescriptions on File Prior to Visit  Medication Sig Dispense Refill  . ALPRAZolam (XANAX) 0.5 MG tablet Take 1 tablet (0.5 mg total) by mouth 2 (two) times daily as needed. 60 tablet 1  . HYDROcodone-acetaminophen (NORCO) 10-325 MG per tablet Take 1 tablet by mouth every 6 (six) hours as needed.    Marland Kitchen levonorgestrel (MIRENA) 20 MCG/24HR IUD 1 each by Intrauterine route once.    . morphine (MS CONTIN) 15 MG 12 hr tablet Take 15 mg by mouth 2 (two) times daily.    . rivaroxaban (XARELTO) 20 MG TABS tablet Take 1  tablet (20 mg total) by mouth daily with supper. 30 tablet 5  . sertraline (ZOLOFT) 100 MG tablet Take 0.5 tablets (50 mg total) by mouth daily. 15 tablet 5  . SUMAtriptan (IMITREX) 50 MG tablet One tablet by mouth at start of migraine.  May repeat in 2 hours if no improvement x 1 dose. 10 tablet 2   No current facility-administered medications on file prior to visit.     BP 120/88   Pulse (!) 107   Temp 98 F (36.7 C) (Oral)   Ht 5\' 3"  (1.6 m)   Wt 225 lb (102.1 kg)   SpO2 98%   BMI 39.86 kg/m       Objective:   Physical Exam   General Mental Status- Alert. General Appearance- Not in acute distress.   Skin General: Color- Normal Color. Moisture- Normal Moisture.  Neck Carotid Arteries- Normal color. Moisture- Normal Moisture. No carotid bruits. No JVD.  Chest and Lung Exam Auscultation: Breath Sounds:-Normal.  Cardiovascular Auscultation:Rythm- Regular. Murmurs & Other Heart Sounds:Auscultation of the heart reveals- No Murmurs.  Abdomen Inspection:-Inspeection Normal. Palpation/Percussion:Note:No mass. Palpation and Percussion of the abdomen reveal- Non Tender, Non Distended + BS, no rebound or guarding.    Neurologic Cranial Nerve exam:- CN III-XII intact(No nystagmus), symmetric smile. Drift Test:- No drift. Romberg Exam:- Negative.  Heal to Toe Gait exam:-Normal. Finger to Nose:- Normal/Intact Strength:- 5/5 equal and symmetric strength both upper and lower extremities.   HEENT Head- Normal. Ear Auditory Canal - Left- Normal. Right - mild swollen(faint tragal tenderness0Tympanic Membrane- Left- Normal. Right- Normal. Eye Sclera/Conjunctiva- Left- Normal. Right- Normal. Nose & Sinuses Nasal Mucosa- Left-  Boggy and Congested. Right-  Boggy and  Congested.Bilateral  No maxillary and  No frontal sinus pressure. Mouth & Throat Lips: Upper Lip- Normal: no dryness, cracking, pallor, cyanosis, or vesicular eruption. Lower Lip-Normal: no dryness, cracking,  pallor, cyanosis or vesicular eruption. Buccal Mucosa- Bilateral- No Aphthous ulcers. Oropharynx- No Discharge or Erythema. Tonsils: Characteristics- Bilateral- No Erythema or Congestion. Size/Enlargement- Bilateral- No enlargement. Discharge- bilateral-None.     .      Assessment & Plan:  For recent headaches and history of migraine headache, we gave you Phenergan 25 mg in office. Your office coworker is coming to pick  you up. Considered giving Toradol today but can't do this due to the fact you use xarelto.  Continue with Imitrex early on for onset of migraine headache. Today prescribed esgic(butalbital-Tylenol--caffeine) tablet for headache.  You are already on MS Contin. Be cautious with taking the above. For the short-term would advise holding off on the Norco for your breakthrough back pain.  We'll go ahead and make referral to your neurologist. So they can address the chronic recurrent severe headaches.  If with above measures her headache is not improved or if you have worse signs and symptoms as discussed then would recommend emergency department evaluation. He is to update [place] by tomorrow or Tuesday if headaches subsided with new medication.  Follow-up in 2 weeks with PCP or as needed.  For possible early mild right otitis externa/swimmer's ear, I am prescribing Cortisporin otic.  Lucerito Rosinski, Ramon Dredge, PA-C

## 2016-12-06 NOTE — Patient Instructions (Addendum)
For recent headaches and history of migraine headache, we gave you Phenergan 25 mg in office. Your office coworker is coming to pick you up. Considered giving Toradol today but can't do this due to the fact you use xarelto.  Continue with Imitrex early on for onset of migraine headache. Today prescribed esgic(butalbital-Tylenol--caffeine) tablet for headache.  You are already on MS Contin. Be cautious with taking the above. For the short-term would advise holding off on the Norco for your breakthrough back pain.  We'll go ahead and make referral to your neurologist. So they can address the chronic recurrent severe headaches.  If with above measures her headache is not improved or if you have worse signs and symptoms as discussed then would recommend emergency department evaluation. He is to update us by tomorrow or Tuesday if headaches subsided with new medication.  Follow-up in 2 weeks with PCP or as needed.  For possible early mild right otitis externa/swimmer's ear, I am prescribing Cortisporin otic.

## 2016-12-06 NOTE — Addendum Note (Signed)
Addended by: Linnell FullingBRANNON, Javia Dillow N on: 12/06/2016 10:18 AM   Modules accepted: Orders

## 2016-12-17 ENCOUNTER — Ambulatory Visit (INDEPENDENT_AMBULATORY_CARE_PROVIDER_SITE_OTHER): Payer: 59 | Admitting: Internal Medicine

## 2016-12-17 ENCOUNTER — Other Ambulatory Visit: Payer: Self-pay | Admitting: Internal Medicine

## 2016-12-17 ENCOUNTER — Encounter: Payer: Self-pay | Admitting: Internal Medicine

## 2016-12-17 VITALS — BP 126/68 | HR 68 | Temp 98.1°F | Resp 14 | Ht 63.0 in | Wt 235.1 lb

## 2016-12-17 DIAGNOSIS — Z23 Encounter for immunization: Secondary | ICD-10-CM | POA: Diagnosis not present

## 2016-12-17 DIAGNOSIS — G43819 Other migraine, intractable, without status migrainosus: Secondary | ICD-10-CM

## 2016-12-17 MED ORDER — TOPIRAMATE 25 MG PO TABS: 50.0000 mg | ORAL_TABLET | Freq: Every day | ORAL | 1 refills | Status: DC

## 2016-12-17 NOTE — Telephone Encounter (Signed)
Pt is requesting refill on alprazolam 0.5mg .  Last OV: 12/17/2016 Last Fill: 06/26/2016 #60 and 1RF UDS: 09/14/2016 Low risk  [place] database printed   Hydrocodone 10-325mg  and Morphine Sulfate ER 15mg  tabs prescribed regularly by Dr. Ethelene Halamos- no other issues noted.  Please advise.

## 2016-12-17 NOTE — Progress Notes (Signed)
Pre visit review using our clinic review tool, if applicable. No additional management support is needed unless otherwise documented below in the visit note. 

## 2016-12-17 NOTE — Telephone Encounter (Signed)
Rx faxed to [facility] pharmacy.  

## 2016-12-17 NOTE — Progress Notes (Signed)
Subjective:    Patient ID: Leslie Rice, female    DOB: Sep 21, 1965, 51 y.o.   MRN: [id number]  DOS:  12/17/2016 Type of visit - description : f/u Interval history: Long history of migraines, they have been more frequent lately. Was seen here a few days ago, was Rx Esgic "worked like a miracle".  Review of Systems Currently feeling well. No nausea, vomiting. No fever chills. Admits to a lot of stress at work.   Past Medical History:  Diagnosis Date  . ALLERGIC RHINITIS   . Chronic pain syndrome    thoracic back pain, seeing Dr. Ethelene Hal  . DEPRESSION   . ELEVATED BLOOD PRESSURE   . GERD   . HYPERLIPIDEMIA   . INSOMNIA   . Irritable bowel syndrome   . MIGRAINE HEADACHE   . OSA (obstructive sleep apnea)    not on Cpap @ present 11-2013  . Primary hypercoagulable state [facility])     Past Surgical History:  Procedure Laterality Date  . c section     x 2   . CARPAL TUNNEL RELEASE     B  . NASAL SINUS SURGERY      Social History   Social History  . Marital status: Single    Spouse name: N/A  . Number of children: 2  . Years of education: N/A   Occupational History  . IT for insurance     Social History Main Topics  . Smoking status: Never Smoker  . Smokeless tobacco: Never Used  . Alcohol use Yes     Comment: occ  . Drug use: No  . Sexual activity: Not on file   Other Topics Concern  . Not on file   Social History Narrative   1 daughter live w/ her    One is in college       Allergies as of 12/17/2016      Reactions   Ciprofloxacin    REACTION: vomiting   Codeine    REACTION: vomiting   Sulfonamide Derivatives    hives      Medication List       Accurate as of 12/17/16  6:54 PM. Always use your most recent med list.          ALPRAZolam 0.5 MG tablet Commonly known as:  XANAX Take 1 tablet (0.5 mg total) by mouth 2 (two) times daily as needed.   butalbital-acetaminophen-caffeine 50-325-40 MG tablet Commonly known as:  ESGIC 1-2 tab po q 6  hours as needed for HA.   HYDROcodone-acetaminophen 10-325 MG tablet Commonly known as:  NORCO Take 1 tablet by mouth every 6 (six) hours as needed.   levonorgestrel 20 MCG/24HR IUD Commonly known as:  MIRENA 1 each by Intrauterine route once.   morphine 15 MG 12 hr tablet Commonly known as:  MS CONTIN Take 15 mg by mouth 2 (two) times daily.   NEOMYCIN-POLYMYXIN-HYDROCORTISONE 1 % Soln OTIC solution Commonly known as:  CORTISPORIN Place 3 drops into the right ear every 6 (six) hours.   promethazine 25 MG tablet Commonly known as:  PHENERGAN Take 1 tablet (25 mg total) by mouth every 6 (six) hours as needed for nausea or vomiting.   rivaroxaban 20 MG Tabs tablet Commonly known as:  XARELTO Take 1 tablet (20 mg total) by mouth daily with supper.   sertraline 100 MG tablet Commonly known as:  ZOLOFT Take 0.5 tablets (50 mg total) by mouth daily.   SUMAtriptan 50 MG tablet Commonly known  as:  IMITREX One tablet by mouth at start of migraine.  May repeat in 2 hours if no improvement x 1 dose.   topiramate 25 MG tablet Commonly known as:  TOPAMAX Take 2 tablets (50 mg total) by mouth daily.            Discharge Care Instructions        Start     Ordered   12/17/16 0000  topiramate (TOPAMAX) 25 MG tablet  Daily     12/17/16 1047   12/17/16 0000  Flu Vaccine QUAD 6+ mos PF IM (Fluarix Quad PF)     12/17/16 1056         Objective:   Physical Exam BP 126/68 (BP Location: Left Arm, Patient Position: Sitting, Cuff Size: Normal)   Pulse 68   Temp 98.1 F (36.7 C) (Oral)   Resp 14   Ht 5\' 3"  (1.6 m)   Wt 235 lb 2 oz (106.7 kg)   SpO2 97%   BMI 41.65 kg/m  General:   Well developed, well nourished . NAD.  HEENT:  Normocephalic . Face symmetric, atraumatic Skin: Not pale. Not jaundice Neurologic:  alert & oriented X3.  Speech normal, gait appropriate for age and unassisted. EOMI, pupils equal and reactive. Motor symmetric. Psych--  Cognition and judgment  appear intact.  Cooperative with normal attention span and concentration.  Behavior appropriate. No anxious or depressed appearing.      Assessment & Plan:   Assessment Primary hypercoagulable state, change Coumadin to Xarelto 05-2014 PE 2002 while on HRT, saw Dr Cyndie ChimeGranfortuna ;+ FH, testing on her was (-);  rx coumadin since 2002 for life  superficial phlebitis 11-2014 Elevated BP Hyperlipidemia-- Pravachol, simvastatin caused aches  Morbidly obese Depression, insomnia: self dc lamictal ~ 08-2014 , on xanax rx by pcp Migraine headaches-- f/u by pcp  on imitrex per pcp Used to see Dr. Sandria ManlyLove, S/P Topamax, BB IBS  OSA-- + test 201 (, [facility]),repeat sleep study + 2017, on CPAP as off 03-2016 Vit B12 and Vit D def-- poor compliance w/ supplements Chronic pain syndrome ---hydrocodone Rx per Dr. Ethelene Halamos Birth Control--- IUD  PLAN  Hedaches: Sound migrainous by description (+ nausea, phono- photophobia). Recently Esgic helped significantly, also good response to Imitrex. In the past she tried Topamax and beta blockers, does not has a good recollection of how she responded. Plan: Start Topamax 50 mg daily. For acute episodes: Rest, fluids, Esgic or Imitrex. Keep her routine appointment to see neurology.

## 2016-12-17 NOTE — Assessment & Plan Note (Signed)
Hedaches: Sound migrainous by description (+ nausea, phono- photophobia). Recently Esgic helped significantly, also good response to Imitrex. In the past she tried Topamax and beta blockers, does not has a good recollection of how she responded. Plan: Start Topamax 50 mg daily. For acute episodes: Rest, fluids, Esgic or Imitrex. Keep her routine appointment to see neurology.

## 2016-12-17 NOTE — Patient Instructions (Signed)
Start Topamax 1 tablet daily for 10 days, then increase it to 2 tablets a day  Keep your follow-up with neurology

## 2016-12-17 NOTE — Telephone Encounter (Signed)
Rx printed, awaiting MD signature.  

## 2016-12-17 NOTE — Telephone Encounter (Signed)
Okay #30 and one refill 

## 2016-12-18 ENCOUNTER — Other Ambulatory Visit: Payer: Self-pay | Admitting: Medical

## 2016-12-18 MED ORDER — BUTALBITAL-APAP-CAFFEINE 50-325-40 MG PO TABS: 1.0000 | ORAL_TABLET | Freq: Four times a day (QID) | ORAL | 0 refills | Status: DC | PRN

## 2016-12-18 NOTE — Telephone Encounter (Signed)
Rx faxed to [facility] pharmacy.  

## 2016-12-18 NOTE — Telephone Encounter (Signed)
Ok #30, no RF 

## 2016-12-18 NOTE — Telephone Encounter (Signed)
Rx printed, awaiting MD signature.  

## 2016-12-18 NOTE — Telephone Encounter (Signed)
Pt is requesting refill on butalbital-acetaminophen-caffeine 50-325-40mg .  Last OV: 12/17/2016  Last Fill: 12/06/2016 #16 and 0RF by Ramon DredgeEdward Pt sig:1-2 tab q6h prn  Okay to refill?  NP appt w/ neuro on 01/29/2017.

## 2016-12-27 DIAGNOSIS — R0602 Shortness of breath: Secondary | ICD-10-CM | POA: Diagnosis not present

## 2016-12-27 DIAGNOSIS — G4733 Obstructive sleep apnea (adult) (pediatric): Secondary | ICD-10-CM | POA: Diagnosis not present

## 2017-01-29 ENCOUNTER — Encounter: Payer: Self-pay | Admitting: Neurology

## 2017-01-29 ENCOUNTER — Ambulatory Visit: Payer: 59 | Admitting: Neurology

## 2017-01-29 ENCOUNTER — Telehealth: Payer: Self-pay | Admitting: Neurology

## 2017-01-29 VITALS — BP 133/89 | HR 64 | Ht 63.0 in | Wt 234.0 lb

## 2017-01-29 DIAGNOSIS — Z9989 Dependence on other enabling machines and devices: Secondary | ICD-10-CM

## 2017-01-29 DIAGNOSIS — G43111 Migraine with aura, intractable, with status migrainosus: Secondary | ICD-10-CM

## 2017-01-29 DIAGNOSIS — G4733 Obstructive sleep apnea (adult) (pediatric): Secondary | ICD-10-CM

## 2017-01-29 DIAGNOSIS — G43101 Migraine with aura, not intractable, with status migrainosus: Secondary | ICD-10-CM | POA: Diagnosis not present

## 2017-01-29 DIAGNOSIS — D689 Coagulation defect, unspecified: Secondary | ICD-10-CM | POA: Diagnosis not present

## 2017-01-29 MED ORDER — FREMANEZUMAB-VFRM 225 MG/1.5ML ~~LOC~~ SOSY: 225.0000 mg | PREFILLED_SYRINGE | SUBCUTANEOUS | Status: DC

## 2017-01-29 MED ORDER — FREMANEZUMAB-VFRM 225 MG/1.5ML ~~LOC~~ SOSY
1.0000 "pen " | PREFILLED_SYRINGE | Freq: Once | SUBCUTANEOUS | Status: AC
  Administered 2017-01-29: 1 via SUBCUTANEOUS

## 2017-01-29 MED ORDER — FREMANEZUMAB-VFRM 225 MG/1.5ML ~~LOC~~ SOSY: 1.0000 "pen " | PREFILLED_SYRINGE | SUBCUTANEOUS | 11 refills | Status: DC

## 2017-01-29 NOTE — Patient Instructions (Signed)
Recurrent Migraine Headache °A migraine headache is very bad, throbbing pain that is usually on one side of your head. Recurrent migraines keep coming back (recurring). Talk with your doctor about what things may bring on (trigger) your migraine headaches. °Follow these instructions at home: °Medicines  °· Take over-the-counter and prescription medicines only as told by your doctor. °· Do not drive or use heavy machinery while taking prescription pain medicine. °Lifestyle  °· Do not use any products that contain nicotine or tobacco, such as cigarettes and e-cigarettes. If you need help quitting, ask your doctor. °· Limit alcohol intake to no more than 1 drink a day for nonpregnant women and 2 drinks a day for men. One drink equals 12 oz of beer, 5 oz of wine, or 1½ oz of hard liquor. °· Get 7-9 hours of sleep each night. °· Lessen any stress in your life. Ask your doctor about ways to lower your stress. °· Stay at a healthy weight. Talk with your doctor if you need help losing weight. °· Get regular exercise. °General instructions  °· Keep a journal to find out if certain things bring on migraine headaches. For example, write down: °¨ What you eat and drink. °¨ How much sleep you get. °¨ Any change to your diet or medicines. °· Lie down in a dark, quiet room when you have a migraine. °· Try placing a cool towel over your head when you have a migraine. °· Keep lights dim if bright lights bother you or make your migraines worse. °· Keep all follow-up visits as told by your doctor. This is important. °Contact a doctor if: °· Medicine does not help your migraines. °· Your pain keeps coming back. °· You have a fever. °· You have weight loss without trying. °Get help right away if: °· Your migraine becomes really bad and medicine does not help. °· You have a stiff neck. °· You have trouble seeing. °· Your muscles are weak or you lose control of your muscles. °· You lose your balance or have trouble walking. °· You feel  like you will pass out (faint) or you pass out. °· You have really bad symptoms that are different than your first symptoms. °· You start having sudden, very bad headaches that last for one second or less, like a thunderclap. °Summary °· A migraine headache is very bad, throbbing pain that is usually on one side of your head. °· Talk with your doctor about what things may bring on (trigger) your migraine headaches. °· Take over-the-counter and prescription medicines only as told by your doctor. °· Lie down in a dark, quiet room when you have a migraine. °· Keep a journal about what you eat and drink, how much sleep you get, and any changes to your medicines. This can help you find out if certain things make you have migraine headaches. °This information is not intended to replace advice given to you by your health care provider. Make sure you discuss any questions you have with your health care provider. °Document Released: 01/03/2008 Document Revised: 02/17/2016 Document Reviewed: 02/17/2016 °Elsevier Interactive Patient Education © 2017 [employer] ° °

## 2017-01-29 NOTE — Telephone Encounter (Signed)
Called the patient back to let her know that we will take of getting either pa completed or sending the rx card to the patient that will help cover the patient until dec 2019. I have fax'ed the rx card over to [facility] in the meantime in case they want to go ahead and attempt the card for the patient.

## 2017-01-29 NOTE — Progress Notes (Signed)
PATIENT: Leslie Rice DOB: [birth year]  REASON FOR VISIT: follow up- OSA on CPAP HISTORY FROM: patient  HISTORY OF PRESENT ILLNESS:  23rd of October 2018, I have pleasure of seeing Leslie Rice again I have followed for obstructive sleep apnea. She today would like to address not just compliance issues but a concerned of migraine. She is a compliant CPAP patient is 83% compliance and an average nightly use of CPAP for 6 hours and 5 minutes CPAP is set at 15 cm water with 3 cm EPR, her residual apnea index is 5.7 she does have good air seal. I do believe that she could benefit from a slightly higher pressure even. Her residual apneas seem to be all obstructive in origin. She does not have central apneas emergent.  *Headaches she presented to her primary care on August 30 at [facility]. At the time she had headaches, vomited and had significant photophobia. 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 Fayrene Fearing love. Because the patient also suffers from a blood clotting disorder she could not be treated with triptans. For several years (8 or 9) she felt better and needed only OTC meds. Over the last 8 month she has lived with tension, stress at work.  Dr Drue Novel wrote for butal butal , and topiramate.  Over the months of September she seemed to have improved but she still has significant enough impairment that she seeks additional help. Headaches are already present when she wakes up in the morning, sometimes it can wake her from sleep. In the morning time headaches are more dull and global but then become sharper more stabbing in character and usually affect the retro-orbital regions.She often presses ice against her eyelids, And this seems to give some temporary relief. She has started to take the butalbital which can lead to rebound headaches but has allowed her at least to function at work. The headaches progress they will last all day. Her only solution then is  to retreat into a dark quiet room and go to bed. Her headaches are characterized as migraines with aura. She has easily 8 severe headaches a month if not more, and the majority have migrainous character.    Leslie Rice is a 51 year old female with a history of obstructive sleep apnea on CPAP. She returns today for a compliance download. Her download indicates that she uses her machine 27 out of 30 days for compliance of 90%. She uses her machine greater than 4 hours 25 out of 30 days for compliance of 83%. Her average usage is 7 hours and 37 minutes. She is on AutoSet with minimum pressure of 5 cm water and maximum pressure 15 cm water with EPR of 3. Her residual AHI is 9.5 with no leak. The patient's average pressure is 14.8 cm of water. Looking at the patient's download she does not fluctuate pressure much but  rather stays around 15 all night. She states that she is wearing the nasal mask however her mask does tend to ride up into her eyes. As of now she states that she has not seen the benefit of using CPAP. She returns today for an evaluation. I consulted with Dr. Frances Furbish- at this time we will recommend that the patient go on a set pressure 15 cm of water. She will also need a mask refitting. She can either make an appointment with our sleep lab or with  her DME company. She will return in 3 months  with Dr. Vickey Huger for a repeat download. Patient is amenable to this plan.  HISTORY 10/20/15: Leslie Rice is a 51 y.o. female , seen here as a referral from Dr. Drue Novel for a sleep evaluation. Former patient of Dr Sandria Manly for [facility] migraines.  Leslie Rice reports that she was diagnosed with sleep apnea approximately 10 years ago, she actively pursued weight loss as treatment options and was successful for about 5 years has started to regain weight back. She is not back to her weight but was present at the time of diagnosis. Recently her sister had stayed with her at the house and witnessed her to snore heavily  but also witnessed apneas again. Mrs. Steeves reported this to Dr. Darrin Nipper who would like her to be reevaluated for possible sleep apnea. Besides the weight gain she endorsed a decreased level of daytime energy, increased daytime sleepiness, and memory difficulties. She has a history of migraine and depression, pulmonary emboli-but she has family members affected by factor V Leiden she was tested negative repeatedly. At the time of PE she was on hormonal birth control.  She was placed on Xeralto  and oxygen at night. Had also carpal tunnel surgery and C-section. She is a mother of 2 daughters 58 and 42.  Chief complaint according to patient : " snoring and pausing my breath "  Sleep habits are as follows: The patient has an early bedtime habits she usually goes to bed between 8 and 9 PM and she falls asleep promptly. She feels exhausted throughout the afternoon and early evening which leads to the early bedtime. She does  watch TV in bed, but goes to sleep nonetheless. She wakes up at least once when she goes to the bathroom, but usually goes back to sleep quickly. She sleeps on one pillow and usually on her left side and her bedroom is usually quiet and cool once she switches of the TV. She has to rise usually around 6:30 AM but often wakes up at 5 and sometimes wishes to sleep longer but can't. She has a very dry mouth at night and in the morning. Some mornings she will have a headache when waking, she has a headache today. There is no nausea with these sleep related headaches. She also has migraine , only once a month.   Her insurance did not pay for treatment of sleep apnea 10 years ago and she advised me that her insurance will change again on August 1 for this reason I would like her to go into the sleep study ASAP.   Sleep medical history and family sleep history:  Sister has OSA on CPAP. All members snore , she is the youngest of 41. 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     Social history:  Divorced, 2 daughters, daytime    REVIEW OF SYSTEMS: Out of a complete 14 system review of symptoms, the patient complains only of the following symptoms, and all other reviewed systems are negative.  Migraine, nausea and visual aura, visions of zic zac lines, and crescent shaped scotoma.    Apnea, frequent waking, daytime sleepiness, snoring, back pain, facial swelling  Headaches  ALLERGIES: Allergies  Allergen Reactions  . Ciprofloxacin     REACTION: vomiting  . Codeine     REACTION: vomiting  . Sulfonamide Derivatives     hives    HOME MEDICATIONS: Outpatient Medications Prior to Visit  Medication Sig Dispense Refill  . ALPRAZolam (XANAX) 0.5 MG tablet  Take 1 tablet (0.5 mg total) by mouth 2 (two) times daily as needed. 60 tablet 1  . butalbital-acetaminophen-caffeine (ESGIC) 50-325-40 MG tablet Take 1-2 tablets by mouth every 6 (six) hours as needed for headache. 30 tablet 0  . HYDROcodone-acetaminophen (NORCO) 10-325 MG per tablet Take 1 tablet by mouth every 6 (six) hours as needed.    Marland Kitchen levonorgestrel (MIRENA) 20 MCG/24HR IUD 1 each by Intrauterine route once.    . morphine (MS CONTIN) 15 MG 12 hr tablet Take 15 mg by mouth 2 (two) times daily.    . promethazine (PHENERGAN) 25 MG tablet Take 1 tablet (25 mg total) by mouth every 6 (six) hours as needed for nausea or vomiting. 30 tablet 0  . rivaroxaban (XARELTO) 20 MG TABS tablet Take 1 tablet (20 mg total) by mouth daily with supper. 30 tablet 5  . sertraline (ZOLOFT) 100 MG tablet Take 0.5 tablets (50 mg total) by mouth daily. 15 tablet 5  . SUMAtriptan (IMITREX) 50 MG tablet One tablet by mouth at start of migraine.  May repeat in 2 hours if no improvement x 1 dose. 10 tablet 2  . topiramate (TOPAMAX) 25 MG tablet Take 2 tablets (50 mg total) by mouth daily. 60 tablet 1  . NEOMYCIN-POLYMYXIN-HYDROCORTISONE (CORTISPORIN) 1 % SOLN OTIC solution Place 3 drops into the right ear every 6 (six) hours. 10 mL  0   No facility-administered medications prior to visit.     PAST MEDICAL HISTORY: Past Medical History:  Diagnosis Date  . ALLERGIC RHINITIS   . Chronic pain syndrome    thoracic back pain, seeing Dr. Ethelene Hal  . DEPRESSION   . ELEVATED BLOOD PRESSURE   . GERD   . HYPERLIPIDEMIA   . INSOMNIA   . Irritable bowel syndrome   . MIGRAINE HEADACHE   . OSA (obstructive sleep apnea)    not on Cpap @ present 11-2013  . Primary hypercoagulable state (HCC)     PAST SURGICAL HISTORY: Past Surgical History:  Procedure Laterality Date  . c section     x 2   . CARPAL TUNNEL RELEASE     B  . NASAL SINUS SURGERY      FAMILY HISTORY: Family History  Problem Relation Age of Onset  . Colon cancer Other        M, aunt, nephew  . CAD Father        age? "silent MIs"  . Lung cancer Father   . Diabetes Father        F, sisters x2  . Thyroid cancer Sister   . Colon cancer Sister        dx age 10  . Breast cancer Neg Hx   . Stroke Neg Hx     SOCIAL HISTORY: Social History   Social History  . Marital status: Single    Spouse name: N/A  . Number of children: 2  . Years of education: N/A   Occupational History  . IT for insurance     Social History Main Topics  . Smoking status: Never Smoker  . Smokeless tobacco: Never Used  . Alcohol use Yes     Comment: occ  . Drug use: No  . Sexual activity: Not on file   Other Topics Concern  . Not on file   Social History Narrative   1 daughter live w/ her    One is in college       PHYSICAL EXAM  Vitals:   01/29/17 1401  BP: 133/89  Pulse: 64  Weight: 234 lb (106.1 kg)  Height: 5\' 3"  (1.6 m)   Body mass index is 41.45 kg/m.  Generalized: Well developed, in no acute distress   Neurological examination  Mentation: Alert oriented to time, place, history taking. Follows all commands speech and language fluent Cranial nerve II-XII: Pupils were equal round reactive to light. No papilledema. Extraocular movements were  full, visual field were full on confrontational test. Facial sensation and strength were normal. Uvula and  tongue in midline, no tremor or fasciculation. Head turning and shoulder shrug were normal and symmetric. Mallampati 3+ Motor: The motor testing reveals 5 / 5 strength of all 4 extremities,symmetric motor tone is noted throughout.  Sensory: Sensory testing is intact .  Gait and station: Gait is normal based, stance is stabile. No drift on Romberg. .  Reflexes: symmetric bilaterally.   DIAGNOSTIC DATA (LABS, IMAGING, TESTING) - I reviewed patient records, labs, notes, testing and imaging myself where available.  Lab Results  Component Value Date   WBC 4.7 11/05/2016   HGB 14.1 11/05/2016   HCT 40.7 11/05/2016   MCV 92.7 11/05/2016   PLT 205.0 11/05/2016      Component Value Date/Time   NA 139 11/05/2016 1027   K 4.5 11/05/2016 1027   CL 105 11/05/2016 1027   CO2 26 11/05/2016 1027   GLUCOSE 75 11/05/2016 1027   BUN 9 11/05/2016 1027   CREATININE 0.75 11/05/2016 1027   CALCIUM 9.3 11/05/2016 1027   PROT 7.0 11/05/2016 1027   ALBUMIN 4.3 11/05/2016 1027   AST 15 11/05/2016 1027   ALT 12 11/05/2016 1027   ALKPHOS 58 11/05/2016 1027   BILITOT 0.9 11/05/2016 1027   Lab Results  Component Value Date   CHOL 229 (H) 11/05/2016   HDL 41.70 11/05/2016   LDLDIRECT 122.0 11/05/2016   TRIG 286.0 (H) 11/05/2016   CHOLHDL 5 11/05/2016   No results found for: HGBA1C Lab Results  Component Value Date   VITAMINB12 353 04/08/2015   Lab Results  Component Value Date   TSH 2.42 09/30/2015      ASSESSMENT AND PLAN 51 y.o. year old female  has a past medical history of ALLERGIC RHINITIS; Chronic pain syndrome; DEPRESSION; ELEVATED BLOOD PRESSURE; GERD; HYPERLIPIDEMIA; INSOMNIA; Irritable bowel syndrome; MIGRAINE HEADACHE; OSA (obstructive sleep apnea); and Primary hypercoagulable state (HCC). here with:  1. Obstructive sleep apnea on CPAP- compliant , will increase pressure to  16 cm water .  2. chronic migraines, severe headaches 8 times per month, the pattern is often 3-4 days of headaches and then a 2 day break from headaches and has been for the last 2-3 months. Topiramate has helped apparently took down the intensity. The patient had a CT of the head in April which was negative and showed only the left sphenoid sinus retention cyst, which is not the cause of these headaches. I consider her headaches migraine and migraine sequela with visual aura because she has a blood clotting disorder she should not be treated for strep times, I would like for her to use a new drug category, CPR G. I will initiate the first treatment today and arrange for a revisit in 8 weeks with my nurse practitioner.   I spent 35 minutes with the patient 50% of this time was spent interviewing  the patient for her migraine history and new complaint.  CPAP download.  Melvyn Novasarmen Canaan Prue, MD  01/29/2017, 2:12 PM [facility] [address], [address]  [place], [place] [address] [phone]

## 2017-01-29 NOTE — Telephone Encounter (Signed)
Pt called Fremanezumab-vfrm (AJOVY) 225 MG/1.5ML SOSY is not covered by [facility] and it will cost her $600 out of pocket. What is the next step?

## 2017-01-29 NOTE — Addendum Note (Signed)
Addended by: Judi CongBRUNO, Bertil Brickey C on: 01/29/2017 03:18 PM   Modules accepted: Orders

## 2017-02-13 ENCOUNTER — Other Ambulatory Visit: Payer: Self-pay | Admitting: Internal Medicine

## 2017-02-18 ENCOUNTER — Other Ambulatory Visit: Payer: Self-pay

## 2017-02-18 MED ORDER — SERTRALINE HCL 100 MG PO TABS: 50.0000 mg | ORAL_TABLET | Freq: Every day | ORAL | 3 refills | Status: DC

## 2017-02-25 DIAGNOSIS — M5136 Other intervertebral disc degeneration, lumbar region: Secondary | ICD-10-CM | POA: Diagnosis not present

## 2017-02-25 DIAGNOSIS — G894 Chronic pain syndrome: Secondary | ICD-10-CM | POA: Diagnosis not present

## 2017-02-26 ENCOUNTER — Telehealth: Payer: Self-pay | Admitting: Neurology

## 2017-02-26 NOTE — Telephone Encounter (Signed)
Pt called she is going to take ajovy today for the 1st time at home. She said it has been in the refrigerator,she needs to know how long to leave it out prior to injection. Please call

## 2017-02-26 NOTE — Telephone Encounter (Signed)
Called pt back and made her aware that the patient should allow for the medication to be out of the fridge for 30 min and went over how to give the injection over the phone. Pt verbalized understanding.

## 2017-03-08 ENCOUNTER — Telehealth: Payer: Self-pay | Admitting: Internal Medicine

## 2017-03-08 ENCOUNTER — Other Ambulatory Visit: Payer: Self-pay | Admitting: Internal Medicine

## 2017-03-08 ENCOUNTER — Other Ambulatory Visit: Payer: Self-pay | Admitting: Medical

## 2017-03-08 NOTE — Telephone Encounter (Signed)
Okay 30 and 1 refill.  (Apparently patient will lose her insurance coverage soon)

## 2017-03-08 NOTE — Telephone Encounter (Signed)
Rx faxed to [facility] pharmacy.  

## 2017-03-08 NOTE — Telephone Encounter (Signed)
We have received refill requests. They have been forwarded to provider.

## 2017-03-08 NOTE — Telephone Encounter (Signed)
Advised patient: According to our notes she had a reaction to it.  Reason for refill?

## 2017-03-08 NOTE — Telephone Encounter (Signed)
Copied from CRM [phone]. Topic: Quick Communication - See Telephone Encounter >> Mar 08, 2017  2:18 PM Diana EvesHoyt, Maryann B wrote: CRM for notification. See Telephone encounter for:  Pt has requested med refills with her pharmacy due to possible lose of ins and job after dec. So she is hoping to get as many as she can filled before that happens.  03/08/17.

## 2017-03-08 NOTE — Telephone Encounter (Signed)
Pt requesting refill on Fioricet. Please advise.

## 2017-03-08 NOTE — Telephone Encounter (Signed)
Pt is requesting refill on diprolene. No longer on med list. Please advise.

## 2017-03-13 ENCOUNTER — Other Ambulatory Visit: Payer: Self-pay

## 2017-03-13 ENCOUNTER — Other Ambulatory Visit: Payer: Self-pay | Admitting: Medical

## 2017-03-13 ENCOUNTER — Encounter: Payer: Self-pay | Admitting: Internal Medicine

## 2017-03-13 MED ORDER — PROMETHAZINE HCL 25 MG PO TABS: 25.0000 mg | ORAL_TABLET | Freq: Four times a day (QID) | ORAL | 0 refills | Status: DC | PRN

## 2017-03-16 DIAGNOSIS — M5136 Other intervertebral disc degeneration, lumbar region: Secondary | ICD-10-CM | POA: Diagnosis not present

## 2017-03-16 DIAGNOSIS — M5416 Radiculopathy, lumbar region: Secondary | ICD-10-CM | POA: Diagnosis not present

## 2017-04-09 NOTE — Progress Notes (Deleted)
PATIENT: Leslie Rice DOB: Nov 25, 1965  REASON FOR VISIT: follow up HISTORY FROM: patient  HISTORY OF PRESENT ILLNESS: HISTORY I have pleasure of seeing Leslie Rice again I have followed for obstructive sleep apnea. She today would like to address not just compliance issues but a concerned of migraine. She is a compliant CPAP patient is 83% compliance and an average nightly use of CPAP for 6 hours and 5 minutes CPAP is set at 15 cm water with 3 cm EPR, her residual apnea index is 5.7 she does have good air seal. I do believe that she could benefit from a slightly higher pressure even. Her residual apneas seem to be all obstructive in origin. She does not have central apneas emergent.  *Headaches she presented to her primary care on August 30 at [facility]. At the time she had headaches, vomited and had significant photophobia. 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 Fayrene Fearing love. Because the patient also suffers from a blood clotting disorder she could not be treated with triptans. For several years (8 or 9) she felt better and needed only OTC meds. Over the last 8 month she has lived with tension, stress at work.  Dr Drue Novel wrote for butal butal , and topiramate.  Over the months of September she seemed to have improved but she still has significant enough impairment that she seeks additional help. Headaches are already present when she wakes up in the morning, sometimes it can wake her from sleep. In the morning time headaches are more dull and global but then become sharper more stabbing in character and usually affect the retro-orbital regions.She often presses ice against her eyelids, And this seems to give some temporary relief. She has started to take the butalbital which can lead to rebound headaches but has allowed her at least to function at work. The headaches progress they will last all day. Her only solution then is to retreat into a dark quiet  room and go to bed. Her headaches are characterized as migraines with aura. She has easily 8 severe headaches a month if not more, and the majority have migrainous character.  REVIEW OF SYSTEMS: Out of a complete 14 system review of symptoms, the patient complains only of the following symptoms, and all other reviewed systems are negative.  ALLERGIES: Allergies  Allergen Reactions  . Ciprofloxacin     REACTION: vomiting  . Codeine     REACTION: vomiting  . Sulfonamide Derivatives     hives    HOME MEDICATIONS: Outpatient Medications Prior to Visit  Medication Sig Dispense Refill  . ALPRAZolam (XANAX) 0.5 MG tablet Take 1 tablet (0.5 mg total) by mouth 2 (two) times daily as needed. 60 tablet 1  . butalbital-acetaminophen-caffeine (FIORICET, ESGIC) 50-325-40 MG tablet TAKE 1 TO 2 TABLETS BY MOUTH EVERY 6 HOURS AS NEEDED FOR HEADACHE 30 tablet 1  . Fremanezumab-vfrm (AJOVY) 225 MG/1.5ML SOSY Inject 1 pen into the skin every 30 (thirty) days. 1 Syringe 11  . HYDROcodone-acetaminophen (NORCO) 10-325 MG per tablet Take 1 tablet by mouth every 6 (six) hours as needed.    Marland Kitchen levonorgestrel (MIRENA) 20 MCG/24HR IUD 1 each by Intrauterine route once.    . morphine (MS CONTIN) 15 MG 12 hr tablet Take 15 mg by mouth 2 (two) times daily.    . promethazine (PHENERGAN) 25 MG tablet Take 1 tablet (25 mg total) by mouth every 6 (six) hours as needed for nausea  or vomiting. 30 tablet 0  . rivaroxaban (XARELTO) 20 MG TABS tablet Take 1 tablet (20 mg total) by mouth daily with supper. 30 tablet 5  . sertraline (ZOLOFT) 100 MG tablet Take 0.5 tablets (50 mg total) daily by mouth. 45 tablet 3  . SUMAtriptan (IMITREX) 50 MG tablet One tablet by mouth at start of migraine.  May repeat in 2 hours if no improvement x 1 dose. 10 tablet 2  . topiramate (TOPAMAX) 25 MG tablet Take 2 tablets (50 mg total) daily by mouth. 60 tablet 3   No facility-administered medications prior to visit.     PAST MEDICAL  HISTORY: Past Medical History:  Diagnosis Date  . ALLERGIC RHINITIS   . Chronic pain syndrome    thoracic back pain, seeing Dr. Ethelene Halamos  . DEPRESSION   . ELEVATED BLOOD PRESSURE   . GERD   . HYPERLIPIDEMIA   . INSOMNIA   . Irritable bowel syndrome   . MIGRAINE HEADACHE   . OSA (obstructive sleep apnea)    not on Cpap @ present 11-2013  . Primary hypercoagulable state (HCC)     PAST SURGICAL HISTORY: Past Surgical History:  Procedure Laterality Date  . c section     x 2   . CARPAL TUNNEL RELEASE     B  . NASAL SINUS SURGERY      FAMILY HISTORY: Family History  Problem Relation Age of Onset  . Colon cancer Other        M, aunt, nephew  . CAD Father        age? "silent MIs"  . Lung cancer Father   . Diabetes Father        F, sisters x2  . Thyroid cancer Sister   . Colon cancer Sister        dx age 50  . Breast cancer Neg Hx   . Stroke Neg Hx     SOCIAL HISTORY: Social History   Socioeconomic History  . Marital status: Single    Spouse name: Not on file  . Number of children: 2  . Years of education: Not on file  . Highest education level: Not on file  Social Needs  . Financial resource strain: Not on file  . Food insecurity - worry: Not on file  . Food insecurity - inability: Not on file  . Transportation needs - medical: Not on file  . Transportation needs - non-medical: Not on file  Occupational History  . 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 Smoking status: Never Smoker  . Smokeless tobacco: Never Used  Substance and Sexual Activity  . Alcohol use: Yes    Comment: occ  . Drug use: No  . Sexual activity: Not on file  Other Topics Concern  . Not on file  Social History Narrative   1 daughter live w/ her    One is in college       PHYSICAL EXAM  There were no vitals filed for this visit. There is no height or weight on file to calculate BMI.  Generalized: Well developed, in no acute distress   Neurological examination   Mentation: Alert oriented to time, place, history taking. Follows all commands speech and language fluent Cranial nerve II-XII: Pupils were equal round reactive to light. Extraocular movements were full, visual field were full on confrontational test. Facial sensation and strength were normal. Uvula tongue midline. Head turning and shoulder shrug  were normal and symmetric. Motor: The motor  testing reveals 5 over 5 strength of all 4 extremities. Good symmetric motor tone is noted throughout.  Sensory: Sensory testing is intact to soft touch on all 4 extremities. No evidence of extinction is noted.  Coordination: Cerebellar testing reveals good finger-nose-finger and heel-to-shin bilaterally.  Gait and station: Gait is normal. Tandem gait is normal. Romberg is negative. No drift is seen.  Reflexes: Deep tendon reflexes are symmetric and normal bilaterally.   DIAGNOSTIC DATA (LABS, IMAGING, TESTING) - I reviewed patient records, labs, notes, testing and imaging myself where available.  Lab Results  Component Value Date   WBC 4.7 11/05/2016   HGB 14.1 11/05/2016   HCT 40.7 11/05/2016   MCV 92.7 11/05/2016   PLT 205.0 11/05/2016      Component Value Date/Time   NA 139 11/05/2016 1027   K 4.5 11/05/2016 1027   CL 105 11/05/2016 1027   CO2 26 11/05/2016 1027   GLUCOSE 75 11/05/2016 1027   BUN 9 11/05/2016 1027   CREATININE 0.75 11/05/2016 1027   CALCIUM 9.3 11/05/2016 1027   PROT 7.0 11/05/2016 1027   ALBUMIN 4.3 11/05/2016 1027   AST 15 11/05/2016 1027   ALT 12 11/05/2016 1027   ALKPHOS 58 11/05/2016 1027   BILITOT 0.9 11/05/2016 1027   Lab Results  Component Value Date   CHOL 229 (H) 11/05/2016   HDL 41.70 11/05/2016   LDLDIRECT 122.0 11/05/2016   TRIG 286.0 (H) 11/05/2016   CHOLHDL 5 11/05/2016   No results found for: HGBA1C Lab Results  Component Value Date   VITAMINB12 353 04/08/2015   Lab Results  Component Value Date   TSH 2.42 09/30/2015      ASSESSMENT AND  PLAN 53 y.o. year old female  has a past medical history of ALLERGIC RHINITIS, Chronic pain syndrome, DEPRESSION, ELEVATED BLOOD PRESSURE, GERD, HYPERLIPIDEMIA, INSOMNIA, Irritable bowel syndrome, MIGRAINE HEADACHE, OSA (obstructive sleep apnea), and Primary hypercoagulable state (HCC). here with ***     Butch Penny, MSN, NP-C 04/09/2017, 3:55 PM [facility] [address] [place], [place] [address] [phone]

## 2017-04-10 ENCOUNTER — Ambulatory Visit: Payer: 59 | Admitting: Adult Health

## 2017-04-10 ENCOUNTER — Telehealth: Payer: Self-pay | Admitting: *Deleted

## 2017-04-10 NOTE — Telephone Encounter (Signed)
Patient was no show for follow up with NP today.  

## 2017-04-11 ENCOUNTER — Encounter: Payer: Self-pay | Admitting: Adult Health

## 2017-04-24 ENCOUNTER — Other Ambulatory Visit: Payer: Self-pay

## 2017-04-24 MED ORDER — RIVAROXABAN 20 MG PO TABS: 20.0000 mg | ORAL_TABLET | Freq: Every day | ORAL | 1 refills | Status: DC

## 2017-04-30 ENCOUNTER — Telehealth: Payer: Self-pay | Admitting: Internal Medicine

## 2017-04-30 NOTE — Telephone Encounter (Signed)
Patient Saving Card was mailed out on 04/30/2017, Per Patient never picked up- RX Bin was cleaned out.

## 2017-05-20 ENCOUNTER — Telehealth: Payer: Self-pay | Admitting: Neurology

## 2017-05-20 NOTE — Telephone Encounter (Signed)
PA submitted through cover my meds through [employer]. KEY: [id number]: [id number] Will wait until they respond with whether they will approve or deny

## 2017-05-21 NOTE — Telephone Encounter (Signed)
Received a letter from [employer] rx stating that this script was already on the list of covered meds and that a PA was not required for this medication.

## 2017-05-27 ENCOUNTER — Other Ambulatory Visit: Payer: Self-pay

## 2017-05-27 MED ORDER — SERTRALINE HCL 100 MG PO TABS: 50.0000 mg | ORAL_TABLET | Freq: Every day | ORAL | 2 refills | Status: DC

## 2017-06-14 DIAGNOSIS — M545 Low back pain: Secondary | ICD-10-CM | POA: Diagnosis not present

## 2017-06-14 DIAGNOSIS — G894 Chronic pain syndrome: Secondary | ICD-10-CM | POA: Diagnosis not present

## 2017-06-14 DIAGNOSIS — Z79899 Other long term (current) drug therapy: Secondary | ICD-10-CM | POA: Diagnosis not present

## 2017-06-21 ENCOUNTER — Other Ambulatory Visit: Payer: Self-pay

## 2017-06-21 MED ORDER — TOPIRAMATE 25 MG PO TABS: 50.0000 mg | ORAL_TABLET | Freq: Every day | ORAL | 1 refills | Status: DC

## 2017-09-03 ENCOUNTER — Ambulatory Visit: Payer: 59 | Admitting: Internal Medicine

## 2017-09-03 ENCOUNTER — Ambulatory Visit: Payer: Self-pay | Admitting: *Deleted

## 2017-09-03 DIAGNOSIS — L259 Unspecified contact dermatitis, unspecified cause: Secondary | ICD-10-CM | POA: Diagnosis not present

## 2017-09-03 NOTE — Telephone Encounter (Signed)
Pt called with complaints of swollen eyes, itchy yesterday and she took benadryl; the pt states that she did have poison ivy on her hands a couple of days ago; she does have a rash on her right hand; she says that she has been applying cool towels to the area; nurse triage recommendations made per protocol to include seeing a physician within 24 hours; pt offered and accepted appointment, per [facility] agent, Lawanna Kobus; with Dr Aviva Kluver [facility] today at 1400; will route to office for notification of this upcoming appointment.   Reason for Disposition . [1] SEVERE eyelid swelling (i.e., shut or almost) AND [2] involves both eyes AND [2] itchy  Answer Assessment - Initial Assessment Questions 1. ONSET: "When did the swelling start?" (e.g., minutes, hours, days)     09/02/17 2. LOCATION: "What part of the eyelids is swollen?"     Upper and lower lid  3. SEVERITY: "How swollen is it?"     Almost swollen shut 4. ITCHING: "Is there any itching?" If so, ask: "How much?"   (Scale 1-10; mild, moderate or severe)     moderate 5. PAIN: "Is the swelling painful to touch?" If so, ask: "How painful is it?"   (Scale 1-10; mild, moderate or severe)     Mild; feel sore 6. FEVER: "Do you have a fever?" If so, ask: "What is it, how was it measured, and when did it start?"      no 7. CAUSE: "What do you think is causing the swelling?"     Poison ivy on right habd  8. RECURRENT SYMPTOM: "Have you had eyelid swelling before?" If so, ask: "When was the last time?" "What happened that time?"     Years ago ate strawberry yogurt and had eyelid swelling 9. OTHER SYMPTOMS: "Do you have any other symptoms?" (e.g., blurred vision, eye discharge, rash, runny nose)     Rash on right hand 10. PREGNANCY: "Is there any chance you are pregnant?" "When was your last menstrual period?"       No, IUD  Protocols used: EYE - [facility]

## 2017-09-03 NOTE — Telephone Encounter (Signed)
FYI to PCP about upcoming appt.  

## 2017-09-04 ENCOUNTER — Ambulatory Visit (INDEPENDENT_AMBULATORY_CARE_PROVIDER_SITE_OTHER): Payer: 59 | Admitting: Medical

## 2017-09-04 ENCOUNTER — Encounter: Payer: Self-pay | Admitting: Medical

## 2017-09-04 ENCOUNTER — Ambulatory Visit: Payer: Self-pay

## 2017-09-04 VITALS — BP 142/91 | HR 80 | Temp 98.3°F | Resp 16 | Ht 63.0 in | Wt 237.2 lb

## 2017-09-04 DIAGNOSIS — T7840XA Allergy, unspecified, initial encounter: Secondary | ICD-10-CM | POA: Diagnosis not present

## 2017-09-04 MED ORDER — METHYLPREDNISOLONE ACETATE 40 MG/ML IJ SUSP
40.0000 mg | Freq: Once | INTRAMUSCULAR | Status: AC
  Administered 2017-09-04: 40 mg via INTRAMUSCULAR

## 2017-09-04 MED ORDER — PREDNISONE 20 MG PO TABS: ORAL_TABLET | ORAL | 0 refills | Status: DC

## 2017-09-04 MED ORDER — HYDROXYZINE HCL 25 MG PO TABS: 25.0000 mg | ORAL_TABLET | Freq: Three times a day (TID) | ORAL | 0 refills | Status: DC | PRN

## 2017-09-04 NOTE — Progress Notes (Signed)
Subjective:    Patient ID: Leslie Rice, female    DOB: [birth year], 52 y.o.   MRN: [id number]  HPI  Pt in with recent red red rash to her face. She was working in yard on Saturday night. She remember pulling on vine in yard. She thinks maybe poison ivy.   Monday  night her eyes began to itch. Then Tuesday morning eyes were swollen and itching. Face itching as well.   Also some rash between 2nd and 3rd digit.   Pt went to [facility] and got injection of steroid. Also they gave her tapered prednisone 6 days taper dose pack. Took first day tabs only so far. They gave her decadron 10 mg injection.  Pt was told to take benadryl for itching.  Pt notes that her swelling has improved. She shows me pictures of her face before the treatment and she has improvement.   She wonders if she can get second injection.  Pt started prednisone yesterday.  Pt had no wheezing. No sob.  She is not diabetic.    Review of Systems  Constitutional: Negative for chills, fatigue and fever.  HENT: Negative for congestion, ear discharge and ear pain.   Respiratory: Negative for cough, chest tightness, shortness of breath and wheezing.   Cardiovascular: Negative for chest pain and palpitations.  Gastrointestinal: Negative for abdominal pain.  Genitourinary: Negative for frequency.  Musculoskeletal: Negative for back pain, myalgias, neck pain and neck stiffness.  Skin: Positive for rash.       With itching.  Neurological: Negative for tremors, seizures, facial asymmetry, weakness and light-headedness.  Hematological: Negative for adenopathy. Does not bruise/bleed easily.  Psychiatric/Behavioral: Negative for behavioral problems and confusion. The patient is not nervous/anxious.    Past Medical History:  Diagnosis Date  . ALLERGIC RHINITIS   . Chronic pain syndrome    thoracic back pain, seeing Dr. Ethelene Hal  . DEPRESSION   . ELEVATED BLOOD PRESSURE   . GERD   . HYPERLIPIDEMIA   . INSOMNIA   .  Irritable bowel syndrome   . MIGRAINE HEADACHE   . OSA (obstructive sleep apnea)    not on Cpap @ present 11-2013  . Primary hypercoagulable state [facility])      Social History   Socioeconomic History  . Marital status: Single    Spouse name: Not on file  . Number of children: 2  . Years of education: Not on file  . Highest education level: Not on file  Occupational History  . 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 Financial resource strain: Not on file  . Food insecurity:    Worry: Not on file    Inability: Not on file  . Transportation needs:    Medical: Not on file    Non-medical: Not on file  Tobacco Use  . Smoking status: Never Smoker  . Smokeless tobacco: Never Used  Substance and Sexual Activity  . Alcohol use: Yes    Comment: occ  . Drug use: No  . Sexual activity: Not on file  Lifestyle  . Physical activity:    Days per week: Not on file    Minutes per session: Not on file  . Stress: Not on file  Relationships  . Social connections:    Talks on phone: Not on file    Gets together: Not on file    Attends religious service: Not on file    Active member of club or organization: Not on file  Attends meetings of clubs or organizations: Not on file    Relationship status: Not on file  . Intimate partner violence:    Fear of current or ex partner: Not on file    Emotionally abused: Not on file    Physically abused: Not on file    Forced sexual activity: Not on file  Other Topics Concern  . Not on file  Social History Narrative   1 daughter live w/ her    One is in college     Past Surgical History:  Procedure Laterality Date  . c section     x 2   . CARPAL TUNNEL RELEASE     B  . NASAL SINUS SURGERY      Family History  Problem Relation Age of Onset  . Colon cancer Other        M, aunt, nephew  . CAD Father        age? "silent MIs"  . Lung cancer Father   . Diabetes Father        F, sisters x2  . Thyroid cancer Sister   . Colon  cancer Sister        dx age 79  . Breast cancer Neg Hx   . Stroke Neg Hx     Allergies  Allergen Reactions  . Ciprofloxacin     REACTION: vomiting  . Codeine     REACTION: vomiting  . Sulfonamide Derivatives     hives    Current Outpatient Medications on File Prior to Visit  Medication Sig Dispense Refill  . ALPRAZolam (XANAX) 0.5 MG tablet Take 1 tablet (0.5 mg total) by mouth 2 (two) times daily as needed. 60 tablet 1  . butalbital-acetaminophen-caffeine (FIORICET, ESGIC) 50-325-40 MG tablet TAKE 1 TO 2 TABLETS BY MOUTH EVERY 6 HOURS AS NEEDED FOR HEADACHE 30 tablet 1  . Fremanezumab-vfrm (AJOVY) 225 MG/1.5ML SOSY Inject 1 pen into the skin every 30 (thirty) days. 1 Syringe 11  . HYDROcodone-acetaminophen (NORCO) 10-325 MG per tablet Take 1 tablet by mouth every 6 (six) hours as needed.    Marland Kitchen levonorgestrel (MIRENA) 20 MCG/24HR IUD 1 each by Intrauterine route once.    . morphine (MS CONTIN) 15 MG 12 hr tablet Take 15 mg by mouth 2 (two) times daily.    . promethazine (PHENERGAN) 25 MG tablet Take 1 tablet (25 mg total) by mouth every 6 (six) hours as needed for nausea or vomiting. 30 tablet 0  . rivaroxaban (XARELTO) 20 MG TABS tablet Take 1 tablet (20 mg total) by mouth daily with supper. 90 tablet 1  . sertraline (ZOLOFT) 100 MG tablet Take 0.5 tablets (50 mg total) by mouth daily. 45 tablet 2  . SUMAtriptan (IMITREX) 50 MG tablet One tablet by mouth at start of migraine.  May repeat in 2 hours if no improvement x 1 dose. 10 tablet 2   No current facility-administered medications on file prior to visit.     BP (!) 142/91   Pulse 80   Temp 98.3 F (36.8 C) (Oral)   Resp 16   Ht  (1.6 m)   Wt 237 lb 3.2 oz (107.6 kg)   SpO2 100%   BMI 42.02 kg/m       Objective:   Physical Exam   General  Mental Status - Alert. General Appearance - Well groomed. Not in acute distress.  Skin Rashes- puffy swollen rd cheeks bilaterally with swollen eye lids. But less swollen  compared to  pictures on Tuesday. Slight papular rash between rt 3rd and 4th digit.  HEENT Head- Normal. Ear Auditory Canal - Left- Normal. Right - Normal.Tympanic Membrane- Left- Normal. Right- Normal. Eye Sclera/Conjunctiva- Left- Normal. Right- Normal. Nose & Sinuses Nasal Mucosa- Left-  Boggy and Congested. Right-  Boggy and  Congested.Bilateral maxillary and frontal sinus pressure. Mouth & Throat Lips: Upper Lip- Normal: no dryness, cracking, pallor, cyanosis, or vesicular eruption. Lower Lip-Normal: no dryness, cracking, pallor, cyanosis or vesicular eruption. Buccal Mucosa- Bilateral- No Aphthous ulcers. Oropharynx- No Discharge or Erythema. Tonsils: Characteristics- Bilateral- No Erythema or Congestion. Size/Enlargement- Bilateral- No enlargement. Discharge- bilateral-None.  Neck Neck- Supple. No Masses.   Chest and Lung Exam Auscultation: Breath Sounds:-Clear even and unlabored.  Cardiovascular Auscultation:Rythm- Regular, rate and rhythm. Murmurs & Other Heart Sounds:Ausculatation of the heart reveal- No Murmurs.  Lymphatic Head & Neck General Head & Neck Lymphatics: Bilateral: Description- No Localized lymphadenopathy.      Assessment & Plan:  For your severe allergic reaction with partial improvement after treatment at [facility], we gave you depomedrol 40 mg im.  I am going to give you prednisone tablet to take 80 mg daily for next 2 days. Then resume day 5 of the 6 day taper dose on saturday  For itching will rx hydroxzine   Please give me update by Friday morning if you had incrimental improvement. If you worsen then advise ED evaluation. In that event may benefit from iv steroids.  Esperanza Richters, PA-C

## 2017-09-04 NOTE — Patient Instructions (Addendum)
For your severe allergic reaction with partial improvement after treatment at [facility], we gave you depomedrol 40 mg im.  I am going to give you prednisone tablet to take 80 mg daily for next 2 days. Then resume day 5 of the 6 day taper dose on saturday  For itching will rx hydroxzine   Please give me update by Friday morning if you had incrimental improvement. If you worsen then advise ED evaluation. In that event may benefit from iv steroids.

## 2017-09-04 NOTE — Telephone Encounter (Signed)
FYI to [facility] about upcoming appt.

## 2017-09-04 NOTE — Telephone Encounter (Signed)
Pt c/o upper and lower eye swelling. Pt was seen at [facility] yesterday and per pt was given a steroid injection and started on po steroids.  Pt was exposed to poison ivy Saturday and developed a rash between her middle to ring finger and rash on the top of her right hand. Monday evening her eyes were partially shut and by Tuesday am her eyes were completely shut. Today her eyes are almost swollen shut. Her eyelids are itchy and tender and red. Pt is taking Tylenol for headache. Pt was inquiring if another steroid shot might help the swelling. Care advice given for disposition to see physician within 24 hours. Appt accepted for today at 1130.  Reason for Disposition . Eyelid is red and painful (or tender to touch)  Answer Assessment - Initial Assessment Questions 1. ONSET: "When did the swelling start?" (e.g., minutes, hours, days)     Monday evening 2. LOCATION: "What part of the eyelids is swollen?"     Upper and lower 3. SEVERITY: "How swollen is it?"     Almost swollen shut 4. ITCHING: "Is there any itching?" If so, ask: "How much?"   (Scale 1-10; mild, moderate or severe)     Yes- moderate 5. PAIN: "Is the swelling painful to touch?" If so, ask: "How painful is it?"   (Scale 1-10; mild, moderate or severe)     Yes-moderate 6. FEVER: "Do you have a fever?" If so, ask: "What is it, how was it measured, and when did it start?"      no 7. CAUSE: "What do you think is causing the swelling?"     Reaction to poison ivy 8. RECURRENT SYMPTOM: "Have you had eyelid swelling before?" If so, ask: "When was the last time?" "What happened that time?"     Yes- 10 years ago ate Activia yogurt and within half hour eyes swollen 9. OTHER SYMPTOMS: "Do you have any other symptoms?" (e.g., blurred vision, eye discharge, rash, runny nose)     Eye discharge to the outer and inner corners of the eyes, rash to right between middle and ring finger and top of hand 10. PREGNANCY: "Is there any chance you are pregnant?"  "When was your last menstrual period?"       No- Pt has IUD and has no menstrual period  Protocols used: EYE - [facility]

## 2017-09-05 ENCOUNTER — Telehealth: Payer: Self-pay | Admitting: Neurology

## 2017-09-05 NOTE — Telephone Encounter (Signed)
PA submitted for Ajovy through cover my meds through [employer]. Can take 72 hours before hearing back. [id number]

## 2017-09-06 NOTE — Telephone Encounter (Signed)
Received a notification to complete a appeal on cover my meds. I have completed and sent it in. Can take up to 5 days before hearing back. KEY: [id number]

## 2017-09-10 ENCOUNTER — Other Ambulatory Visit: Payer: Self-pay | Admitting: Internal Medicine

## 2017-09-10 ENCOUNTER — Encounter: Payer: Self-pay | Admitting: Neurology

## 2017-09-10 DIAGNOSIS — F32A Depression, unspecified: Secondary | ICD-10-CM

## 2017-09-10 DIAGNOSIS — F329 Major depressive disorder, single episode, unspecified: Secondary | ICD-10-CM

## 2017-09-10 NOTE — Telephone Encounter (Signed)
PA denied stating the company does not cover this medication. I will attempt a appeal for the pt.

## 2017-09-10 NOTE — Telephone Encounter (Signed)
Copied from CRM [phone]. Topic: General - Other >> Sep 10, 2017 11:26 AM Elliot GaultBell, Tiffany M wrote: Relation to pt: self Call back number: [phone] Pharmacy: [id number] - [place], [place] - [address] [facility] [address] [phone] (Fax)   Reason for call:  Patient requesting ALPRAZolam Prudy Feeler(XANAX) 0.5 MG tablet, patient denied contacting pharmacy due to pharmacy changing, informed patient pleas allow 48 to 72 hour turn around time,please advise

## 2017-09-11 NOTE — Telephone Encounter (Signed)
Refill request Xanax 0.5 mg  LOV 09/04/2017  Edward Saguier  Last filled 12/17/2016 Dr Drue NovelPaz  60 tabs 1 refill  1 tab bid prn  Pharmacy on File

## 2017-09-11 NOTE — Telephone Encounter (Signed)
Requesting: Alprazolam 0.5mg  bid prn Contract: 09/30/15 UDS: 09/14/2016 Last OV: 5/29 with PA Next Ov: 11/07/17 with Dr. Drue NovelPaz Last refill: 12/17/2016 Database: OD risk score 250/999  Please advise.

## 2017-09-13 MED ORDER — ALPRAZOLAM 0.5 MG PO TABS: 0.5000 mg | ORAL_TABLET | Freq: Two times a day (BID) | ORAL | 1 refills | Status: DC | PRN

## 2017-09-13 NOTE — Telephone Encounter (Signed)
Pt is checking on refill status. [facility] [place] [address] Her daughter's graduation is tomorrow and she has to see her ex and ex in laws and is really hoping to have this called in today.

## 2017-09-16 NOTE — Telephone Encounter (Signed)
Author phoned pt. To see if she had ever picked up the xanax paper rx written by Dr. Drue NovelPaz on 09/13/17. Call back number given [phone].

## 2017-09-18 ENCOUNTER — Encounter: Payer: Self-pay | Admitting: Neurology

## 2017-09-18 NOTE — Telephone Encounter (Signed)
Appeal letter sent on behalf of the pt to attempt to get approval for Ajovy. Can take up to 30 days before hearing back,

## 2017-10-14 DIAGNOSIS — Z79891 Long term (current) use of opiate analgesic: Secondary | ICD-10-CM | POA: Diagnosis not present

## 2017-10-14 DIAGNOSIS — G894 Chronic pain syndrome: Secondary | ICD-10-CM | POA: Diagnosis not present

## 2017-11-07 ENCOUNTER — Encounter: Payer: 59 | Admitting: Internal Medicine

## 2018-02-12 ENCOUNTER — Encounter: Payer: Self-pay | Admitting: Neurology

## 2018-02-12 DIAGNOSIS — M5136 Other intervertebral disc degeneration, lumbar region: Secondary | ICD-10-CM | POA: Diagnosis not present

## 2018-02-12 DIAGNOSIS — G894 Chronic pain syndrome: Secondary | ICD-10-CM | POA: Diagnosis not present

## 2018-02-12 DIAGNOSIS — M545 Low back pain: Secondary | ICD-10-CM | POA: Diagnosis not present

## 2018-02-13 ENCOUNTER — Other Ambulatory Visit: Payer: Self-pay | Admitting: Neurology

## 2018-03-14 ENCOUNTER — Other Ambulatory Visit: Payer: Self-pay | Admitting: Neurology

## 2018-03-17 ENCOUNTER — Other Ambulatory Visit: Payer: Self-pay

## 2018-03-17 ENCOUNTER — Emergency Department (HOSPITAL_BASED_OUTPATIENT_CLINIC_OR_DEPARTMENT_OTHER): Payer: 59

## 2018-03-17 ENCOUNTER — Observation Stay (HOSPITAL_BASED_OUTPATIENT_CLINIC_OR_DEPARTMENT_OTHER)
Admission: EM | Admit: 2018-03-17 | Discharge: 2018-03-18 | Disposition: A | Discharge status: Home / Self Care | Payer: 59 | Attending: Internal Medicine | Admitting: Internal Medicine

## 2018-03-17 ENCOUNTER — Encounter (HOSPITAL_BASED_OUTPATIENT_CLINIC_OR_DEPARTMENT_OTHER): Payer: Self-pay | Admitting: *Deleted

## 2018-03-17 ENCOUNTER — Ambulatory Visit: Payer: Self-pay | Admitting: *Deleted

## 2018-03-17 DIAGNOSIS — R072 Precordial pain: Secondary | ICD-10-CM | POA: Diagnosis not present

## 2018-03-17 DIAGNOSIS — Z79899 Other long term (current) drug therapy: Secondary | ICD-10-CM | POA: Diagnosis not present

## 2018-03-17 DIAGNOSIS — I10 Essential (primary) hypertension: Secondary | ICD-10-CM | POA: Insufficient documentation

## 2018-03-17 DIAGNOSIS — E785 Hyperlipidemia, unspecified: Secondary | ICD-10-CM

## 2018-03-17 DIAGNOSIS — G43909 Migraine, unspecified, not intractable, without status migrainosus: Secondary | ICD-10-CM | POA: Diagnosis not present

## 2018-03-17 DIAGNOSIS — R0602 Shortness of breath: Secondary | ICD-10-CM | POA: Diagnosis not present

## 2018-03-17 DIAGNOSIS — K589 Irritable bowel syndrome without diarrhea: Secondary | ICD-10-CM | POA: Insufficient documentation

## 2018-03-17 DIAGNOSIS — Z23 Encounter for immunization: Secondary | ICD-10-CM | POA: Insufficient documentation

## 2018-03-17 DIAGNOSIS — M549 Dorsalgia, unspecified: Secondary | ICD-10-CM | POA: Insufficient documentation

## 2018-03-17 DIAGNOSIS — F329 Major depressive disorder, single episode, unspecified: Secondary | ICD-10-CM | POA: Diagnosis not present

## 2018-03-17 DIAGNOSIS — G4733 Obstructive sleep apnea (adult) (pediatric): Secondary | ICD-10-CM

## 2018-03-17 DIAGNOSIS — Z6841 Body Mass Index (BMI) 40.0 and over, adult: Secondary | ICD-10-CM | POA: Insufficient documentation

## 2018-03-17 DIAGNOSIS — I82409 Acute embolism and thrombosis of unspecified deep veins of unspecified lower extremity: Secondary | ICD-10-CM | POA: Diagnosis not present

## 2018-03-17 DIAGNOSIS — Z793 Long term (current) use of hormonal contraceptives: Secondary | ICD-10-CM | POA: Insufficient documentation

## 2018-03-17 DIAGNOSIS — Z7901 Long term (current) use of anticoagulants: Secondary | ICD-10-CM | POA: Diagnosis not present

## 2018-03-17 DIAGNOSIS — I2699 Other pulmonary embolism without acute cor pulmonale: Secondary | ICD-10-CM

## 2018-03-17 DIAGNOSIS — Z9119 Patient's noncompliance with other medical treatment and regimen: Secondary | ICD-10-CM | POA: Insufficient documentation

## 2018-03-17 DIAGNOSIS — D6859 Other primary thrombophilia: Secondary | ICD-10-CM

## 2018-03-17 DIAGNOSIS — G894 Chronic pain syndrome: Secondary | ICD-10-CM

## 2018-03-17 DIAGNOSIS — K219 Gastro-esophageal reflux disease without esophagitis: Secondary | ICD-10-CM | POA: Diagnosis not present

## 2018-03-17 DIAGNOSIS — I824Z1 Acute embolism and thrombosis of unspecified deep veins of right distal lower extremity: Secondary | ICD-10-CM | POA: Diagnosis not present

## 2018-03-17 HISTORY — DX: Other pulmonary embolism without acute cor pulmonale: I26.99

## 2018-03-17 HISTORY — DX: Acute embolism and thrombosis of unspecified deep veins of unspecified lower extremity: I82.409

## 2018-03-17 LAB — CBC WITH DIFFERENTIAL/PLATELET
Abs Immature Granulocytes: 0.02 10*3/uL (ref 0.00–0.07)
BASOS ABS: 0 10*3/uL (ref 0.0–0.1)
Basophils Relative: 0 %
EOS PCT: 3 %
Eosinophils Absolute: 0.2 10*3/uL (ref 0.0–0.5)
HCT: 43.1 % (ref 36.0–46.0)
Hemoglobin: 15.2 g/dL — ABNORMAL HIGH (ref 12.0–15.0)
Immature Granulocytes: 0 %
Lymphocytes Relative: 35 %
Lymphs Abs: 2.3 10*3/uL (ref 0.7–4.0)
MCH: 31.7 pg (ref 26.0–34.0)
MCHC: 35.3 g/dL (ref 30.0–36.0)
MCV: 89.8 fL (ref 80.0–100.0)
Monocytes Absolute: 0.3 10*3/uL (ref 0.1–1.0)
Monocytes Relative: 5 %
Neutro Abs: 3.7 10*3/uL (ref 1.7–7.7)
Neutrophils Relative %: 57 %
Platelets: 176 10*3/uL (ref 150–400)
RBC: 4.8 MIL/uL (ref 3.87–5.11)
RDW: 11.4 % — ABNORMAL LOW (ref 11.5–15.5)
WBC: 6.5 10*3/uL (ref 4.0–10.5)
nRBC: 0 % (ref 0.0–0.2)

## 2018-03-17 LAB — COMPREHENSIVE METABOLIC PANEL
ALBUMIN: 4.8 g/dL (ref 3.5–5.0)
ALT: 17 U/L (ref 0–44)
AST: 20 U/L (ref 15–41)
Alkaline Phosphatase: 49 U/L (ref 38–126)
Anion gap: 9 (ref 5–15)
BUN: 11 mg/dL (ref 6–20)
CO2: 21 mmol/L — ABNORMAL LOW (ref 22–32)
Calcium: 9.1 mg/dL (ref 8.9–10.3)
Chloride: 107 mmol/L (ref 98–111)
Creatinine, Ser: 0.67 mg/dL (ref 0.44–1.00)
GFR calc Af Amer: >60 mL/min (ref >60)
GFR calc non Af Amer: >60 mL/min (ref >60)
GLUCOSE: 93 mg/dL (ref 70–99)
Potassium: 3.8 mmol/L (ref 3.5–5.1)
Sodium: 137 mmol/L (ref 135–145)
Total Bilirubin: 0.9 mg/dL (ref 0.3–1.2)
Total Protein: 7.7 g/dL (ref 6.5–8.1)

## 2018-03-17 LAB — TROPONIN I
Troponin I: <0.03 ng/mL (ref <0.03)
Troponin I: <0.03 ng/mL (ref <0.03)

## 2018-03-17 LAB — APTT: aPTT: 70 seconds — ABNORMAL HIGH (ref 24–36)

## 2018-03-17 LAB — MRSA PCR SCREENING: MRSA by PCR: NEGATIVE

## 2018-03-17 LAB — BRAIN NATRIURETIC PEPTIDE: B Natriuretic Peptide: 11.5 pg/mL (ref 0.0–100.0)

## 2018-03-17 LAB — LIPASE, BLOOD: Lipase: 61 U/L — ABNORMAL HIGH (ref 11–51)

## 2018-03-17 LAB — HEPARIN LEVEL (UNFRACTIONATED): Heparin Unfractionated: 0.94 IU/mL — ABNORMAL HIGH (ref 0.30–0.70)

## 2018-03-17 MED ORDER — ONDANSETRON HCL 4 MG PO TABS: 4.0000 mg | ORAL_TABLET | Freq: Four times a day (QID) | ORAL | Status: DC | PRN

## 2018-03-17 MED ORDER — HEPARIN BOLUS VIA INFUSION
5000.0000 [IU] | Freq: Once | INTRAVENOUS | Status: AC
  Administered 2018-03-17: 5000 [IU] via INTRAVENOUS

## 2018-03-17 MED ORDER — ONDANSETRON HCL 4 MG/2ML IJ SOLN: 4.0000 mg | Freq: Four times a day (QID) | INTRAMUSCULAR | Status: DC | PRN

## 2018-03-17 MED ORDER — ACETAMINOPHEN 325 MG PO TABS: 650.0000 mg | ORAL_TABLET | Freq: Four times a day (QID) | ORAL | Status: DC | PRN

## 2018-03-17 MED ORDER — HYDROCODONE-ACETAMINOPHEN 10-325 MG PO TABS
1.0000 | ORAL_TABLET | Freq: Four times a day (QID) | ORAL | Status: DC | PRN
  Administered 2018-03-17 – 2018-03-18 (×2): 1 via ORAL
  Filled 2018-03-17 (×2): qty 1

## 2018-03-17 MED ORDER — HEPARIN (PORCINE) 25000 UT/250ML-% IV SOLN
1450.0000 [IU]/h | INTRAVENOUS | Status: DC
  Administered 2018-03-17 – 2018-03-18 (×2): 1300 [IU]/h via INTRAVENOUS
  Filled 2018-03-17 (×2): qty 250

## 2018-03-17 MED ORDER — INFLUENZA VAC SPLIT QUAD 0.5 ML IM SUSY
0.5000 mL | PREFILLED_SYRINGE | INTRAMUSCULAR | Status: AC
  Administered 2018-03-18: 0.5 mL via INTRAMUSCULAR
  Filled 2018-03-17: qty 0.5

## 2018-03-17 MED ORDER — SODIUM CHLORIDE 0.9 % IV SOLN
INTRAVENOUS | Status: DC
  Administered 2018-03-17: 23:00:00 via INTRAVENOUS

## 2018-03-17 MED ORDER — HYDROCODONE-ACETAMINOPHEN 5-325 MG PO TABS
1.0000 | ORAL_TABLET | Freq: Once | ORAL | Status: AC
  Administered 2018-03-17: 1 via ORAL
  Filled 2018-03-17: qty 1

## 2018-03-17 MED ORDER — ACETAMINOPHEN 650 MG RE SUPP: 650.0000 mg | Freq: Four times a day (QID) | RECTAL | Status: DC | PRN

## 2018-03-17 MED ORDER — IOPAMIDOL (ISOVUE-370) INJECTION 76%
100.0000 mL | Freq: Once | INTRAVENOUS | Status: AC | PRN
  Administered 2018-03-17: 100 mL via INTRAVENOUS

## 2018-03-17 MED ORDER — SERTRALINE HCL 50 MG PO TABS: 50.0000 mg | ORAL_TABLET | Freq: Every day | ORAL | Status: DC

## 2018-03-17 NOTE — Telephone Encounter (Signed)
Hx of PE- needs to go to ED.

## 2018-03-17 NOTE — Telephone Encounter (Signed)
noted 

## 2018-03-17 NOTE — ED Notes (Signed)
Heparin infusion and bolus verified with Amy, RN.

## 2018-03-17 NOTE — ED Notes (Signed)
Pt states she ran out of her xarelto for one month, restarted it on Saturday.

## 2018-03-17 NOTE — ED Notes (Signed)
During ambulation to restroom pt c/o HA. Noted pt to have an even steady gait, pt denies dizziness or visual changes. EDP notified of pt complaint. EDP to bedside to assess pt.

## 2018-03-17 NOTE — Telephone Encounter (Signed)
Pt called with having some shortness of breath on exertion (walking up stairs). It comes and goes with activity. She first noticed this last week. She has a little bit of dizziness and some chest pain. She denies cold symptoms. No swelling in her lower extremities or hands. Pt requesting an appointment with Dr. Drue NovelPaz today.  She said that this felt almost the same as when she had a pulmonary embolus in her 6420's and it was from taking a birth control medication.  Irving BurtonEmily, [facility] [place] notified. Advised pt to go to the ED to assessed. Pt refuses to go, stated the last time she went she had a bill for 4000.00 from being seen for migraines.  [facility].  Pt stated that if she starts having more symptoms, shortness of breath, chest pain, dizziness, she will go to the ED.  Reason for Disposition . History of prior "blood clot" in leg or lungs (i.e., deep vein thrombosis, pulmonary embolism)  Answer Assessment - Initial Assessment Questions 1. RESPIRATORY STATUS: "Describe your breathing?" (e.g., wheezing, shortness of breath, unable to speak, severe coughing)      Shortness of breath 2. ONSET: "When did this breathing problem begin?"      Last week Tuesday 3. PATTERN "Does the difficult breathing come and go, or has it been constant since it started?"      Comes and goes (exertion)  4. SEVERITY: "How bad is your breathing?" (e.g., mild, moderate, severe)    - MILD: No SOB at rest, mild SOB with walking, speaks normally in sentences, can lay down, no retractions, pulse < 100.    - MODERATE: SOB at rest, SOB with minimal exertion and prefers to sit, cannot lie down flat, speaks in phrases, mild retractions, audible wheezing, pulse 100-120.    - SEVERE: Very SOB at rest, speaks in single words, struggling to breathe, sitting hunched forward, retractions, pulse > 120      Mild to moderated 5. RECURRENT SYMPTOM: "Have you had difficulty  breathing before?" If so, ask: "When was the last time?" and "What happened that time?"      Had pulmonary embolus before when taking Yaz in her twenties 6. CARDIAC HISTORY: "Do you have any history of heart disease?" (e.g., heart attack, angina, bypass surgery, angioplasty)      no 7. LUNG HISTORY: "Do you have any history of lung disease?"  (e.g., pulmonary embolus, asthma, emphysema)     Pulmonary embolus 8. CAUSE: "What do you think is causing the breathing problem?"      Not sure 9. OTHER SYMPTOMS: "Do you have any other symptoms? (e.g., dizziness, runny nose, cough, chest pain, fever)     Little chest pain and a little bit of dizziness 10. PREGNANCY: "Is there any chance you are pregnant?" "When was your last menstrual period?"       IUD, no pregnant 11. TRAVEL: "Have you traveled out of the country in the last month?" (e.g., travel history, exposures)       no  Protocols used: BREATHING DIFFICULTY-A-AH

## 2018-03-17 NOTE — ED Provider Notes (Signed)
[facility] EMERGENCY DEPARTMENT Provider Note   CSN: [id number] Arrival date & time: 03/17/18  1139     History   Chief Complaint Chief Complaint  Patient presents with  . Shortness of Breath    HPI Leslie Rice is a 52 y.o. female.  The history is provided by the patient and medical records. No language interpreter was used.  Chest Pain   This is a new problem. The current episode started more than 2 days ago. The problem occurs constantly. The problem has not changed since onset.The pain is associated with breathing and exertion. The pain is present in the substernal region and lateral region. The pain is at a severity of 8/10. The pain is moderate. The quality of the pain is described as heavy, pressure-like, exertional and pleuritic. The pain does not radiate. The symptoms are aggravated by deep breathing and exertion. Associated symptoms include exertional chest pressure, headaches and shortness of breath. Pertinent negatives include no abdominal pain, no back pain, no cough, no diaphoresis, no fever, no irregular heartbeat, no leg pain, no lower extremity edema, no malaise/fatigue, no nausea, no near-syncope, no numbness, no palpitations, no sputum production and no vomiting. She has tried nothing for the symptoms. The treatment provided no relief.  Her past medical history is significant for DVT and PE.    Past Medical History:  Diagnosis Date  . ALLERGIC RHINITIS   . Chronic pain syndrome    thoracic back pain, seeing Dr. Ethelene Hal  . DEPRESSION   . DVT (deep venous thrombosis) (HCC)   . ELEVATED BLOOD PRESSURE   . GERD   . HYPERLIPIDEMIA   . INSOMNIA   . Irritable bowel syndrome   . MIGRAINE HEADACHE   . OSA (obstructive sleep apnea)    not on Cpap @ present 11-2013  . Primary hypercoagulable state (HCC)   . Pulmonary emboli [facility])     Patient Active Problem List   Diagnosis Date Noted  . Hypersomnia with sleep apnea 10/20/2015  . Sleep related headaches  10/20/2015  . Morbid obesity due to excess calories (HCC) 10/20/2015  . Pulmonary embolism on long-term anticoagulation therapy (HCC) 10/20/2015  . PCP NOTES >>>>>>>>>>>>>>>>>>>>>>>>> 07/26/2015  . Seasonal allergies 07/26/2015  . Acute superficial venous thrombosis of lower extremity 11/24/2014  . Annual physical exam 02/29/2012  . VITAMIN B12 DEFICIENCY 07/21/2008  . VITAMIN D DEFICIENCY 07/21/2008  . Hyperlipidemia 07/21/2008  . *Primary hypercoagulable state (change coumadin to xarelto 05-2014) 07/21/2008  . *Depression 07/21/2008  . *Chronic pain syndrome (hydrocodone per Dr. Ethelene Hal) 07/21/2008  . Migraine headache 07/21/2008  . ALLERGIC RHINITIS 07/21/2008  . GERD 07/21/2008  . IRRITABLE BOWEL SYNDROME 07/21/2008  . Insomnia 07/21/2008  . FATIGUE 07/21/2008  . Edema 07/21/2008  . OSA (obstructive sleep apnea) 07/21/2008  . ELEVATED BLOOD PRESSURE 07/21/2008  . PULMONARY EMBOLISM, HX OF 2002 07/21/2008    Past Surgical History:  Procedure Laterality Date  . c section     x 2   . CARPAL TUNNEL RELEASE     B  . NASAL SINUS SURGERY       OB History   None      Home Medications    Prior to Admission medications   Medication Sig Start Date End Date Taking? Authorizing Provider  AJOVY 225 MG/1.5ML SOSY INJECT 1 PEN INTO THE SKIN EVERY 30 DAYS 02/13/18   Dohmeier, Porfirio Mylar, MD  ALPRAZolam Prudy Feeler) 0.5 MG tablet Take 1 tablet (0.5 mg total) by mouth 2 (two)  times daily as needed. 09/13/17   Wanda Plump, MD  butalbital-acetaminophen-caffeine (FIORICET, ESGIC) [phone] MG tablet TAKE 1 TO 2 TABLETS BY MOUTH EVERY 6 HOURS AS NEEDED FOR HEADACHE 03/08/17   Wanda Plump, MD  HYDROcodone-acetaminophen [facility]) 10-325 MG per tablet Take 1 tablet by mouth every 6 (six) hours as needed.    [provider]  hydrOXYzine (ATARAX/VISTARIL) 25 MG tablet Take 1 tablet (25 mg total) by mouth 3 (three) times daily as needed for itching. 09/04/17   Saguier, Ramon Dredge, PA-C  levonorgestrel  (MIRENA) 20 MCG/24HR IUD 1 each by Intrauterine route once.    [provider]  morphine (MS CONTIN) 15 MG 12 hr tablet Take 15 mg by mouth 2 (two) times daily.    [provider]  predniSONE (DELTASONE) 20 MG tablet 4 tab daily for 2 days 09/04/17   Saguier, Ramon Dredge, PA-C  promethazine (PHENERGAN) 25 MG tablet Take 1 tablet (25 mg total) by mouth every 6 (six) hours as needed for nausea or vomiting. 03/13/17   Wanda Plump, MD  rivaroxaban (XARELTO) 20 MG TABS tablet Take 1 tablet (20 mg total) by mouth daily with supper. 04/24/17   Wanda Plump, MD  sertraline (ZOLOFT) 100 MG tablet Take 0.5 tablets (50 mg total) by mouth daily. 05/27/17   Wanda Plump, MD  SUMAtriptan (IMITREX) 50 MG tablet One tablet by mouth at start of migraine.  May repeat in 2 hours if no improvement x 1 dose. 09/21/16   Wanda Plump, MD    Family History Family History  Problem Relation Age of Onset  . Colon cancer Other        M, aunt, nephew  . CAD Father        age? "silent MIs"  . Lung cancer Father   . Diabetes Father        F, sisters x2  . Thyroid cancer Sister   . Colon cancer Sister        dx age 25  . Breast cancer Neg Hx   . Stroke Neg Hx     Social History Social History   Tobacco Use  . Smoking status: Never Smoker  . Smokeless tobacco: Never Used  Substance Use Topics  . Alcohol use: Yes    Comment: occ  . Drug use: No     Allergies   Ciprofloxacin; Codeine; and Sulfonamide derivatives   Review of Systems Review of Systems  Constitutional: Negative for chills, diaphoresis, fatigue, fever and malaise/fatigue.  HENT: Negative for congestion.   Eyes: Negative for photophobia and visual disturbance.  Respiratory: Positive for chest tightness and shortness of breath. Negative for cough, sputum production, choking and wheezing.   Cardiovascular: Positive for chest pain. Negative for palpitations, leg swelling and near-syncope.  Gastrointestinal: Negative for abdominal pain,  constipation, diarrhea, nausea and vomiting.  Genitourinary: Negative for dysuria.  Musculoskeletal: Negative for back pain, neck pain and neck stiffness.  Skin: Negative for rash and wound.  Neurological: Positive for headaches. Negative for light-headedness and numbness.  Psychiatric/Behavioral: Negative for agitation and confusion.  All other systems reviewed and are negative.    Physical Exam Updated Vital Signs BP (!) 150/105   Pulse 75   Resp 15   Ht 5\' 3"  (1.6 m)   Wt 104.3 kg   SpO2 97%   BMI 40.74 kg/m   Physical Exam  Constitutional: She is oriented to person, place, and time. She appears well-developed and well-nourished.  Non-toxic appearance. She  does not appear ill. No distress.  HENT:  Head: Normocephalic and atraumatic.  Nose: Nose normal.  Mouth/Throat: Oropharynx is clear and moist. No oropharyngeal exudate.  Eyes: Pupils are equal, round, and reactive to light. Conjunctivae are normal.  Neck: Normal range of motion. Neck supple.  Cardiovascular: Regular rhythm and intact distal pulses. Tachycardia present.  No murmur heard. Pulmonary/Chest: Effort normal and breath sounds normal. Tachypnea noted. No respiratory distress. She has no decreased breath sounds. She has no wheezes. She has no rhonchi. She has no rales.  Abdominal: Soft. There is no tenderness.  Musculoskeletal: She exhibits no edema or tenderness.  Neurological: She is alert and oriented to person, place, and time. No sensory deficit. She exhibits normal muscle tone.  Skin: Skin is warm and dry. Capillary refill takes less than 2 seconds. No rash noted. She is not diaphoretic. No erythema.  Psychiatric: She has a normal mood and affect.  Nursing note and vitals reviewed.    ED Treatments / Results  Labs (all labs ordered are listed, but only abnormal results are displayed) Labs Reviewed  CBC WITH DIFFERENTIAL/PLATELET - Abnormal; Notable for the following components:      Result Value    Hemoglobin 15.2 (*)    RDW 11.4 (*)    All other components within normal limits  COMPREHENSIVE METABOLIC PANEL - Abnormal; Notable for the following components:   CO2 21 (*)    All other components within normal limits  LIPASE, BLOOD - Abnormal; Notable for the following components:   Lipase 61 (*)    All other components within normal limits  TROPONIN I  HEPARIN LEVEL (UNFRACTIONATED)  APTT    EKG EKG Interpretation  Date/Time:  Monday March 17 2018 11:50:29 EST Ventricular Rate:  76 PR Interval:    QRS Duration: 97 QT Interval:  389 QTC Calculation: 438 R Axis:   -87 Text Interpretation:  Sinus rhythm Left anterior fascicular block Abnormal R-wave progression, late transition Borderline T wave abnormalities No prior ECG for comparison.  NO STEMI Confirmed by Theda Belfast ([id number]) on 03/17/2018 1:27:43 PM   Radiology Ct Angio Chest Pe W And/or Wo Contrast  Result Date: 03/17/2018 CLINICAL DATA:  Chest pain and shortness of breath. History of pulmonary emboli. EXAM: CT ANGIOGRAPHY CHEST WITH CONTRAST TECHNIQUE: Multidetector CT imaging of the chest was performed using the standard protocol during bolus administration of intravenous contrast. Multiplanar CT image reconstructions and MIPs were obtained to evaluate the vascular anatomy. CONTRAST:  ISOVUE-370 IOPAMIDOL (ISOVUE-370) INJECTION 76% COMPARISON:  Report of CT scan dated 12/07/2008 FINDINGS: Cardiovascular: There multiple bilateral pulmonary emboli involving all lobes of both lungs. RV LV ratio is 1.0. Heart size is normal. No pericardial effusion. Mediastinum/Nodes: No enlarged mediastinal, hilar, or axillary lymph nodes. Thyroid gland, trachea, and esophagus demonstrate no significant findings. Lungs/Pleura: Lungs are clear. No pleural effusion or pneumothorax. Upper Abdomen: Normal. Musculoskeletal: No chest wall abnormality. No acute or significant osseous findings. Review of the MIP images confirms the above  findings. IMPRESSION: Extensive bilateral pulmonary emboli. Slightly elevated RV/LV ratio. Critical Value/emergent results were called by telephone at the time of interpretation on 03/17/2018 at 1:31 pm to Dr. Lynden Oxford , who verbally acknowledged these results. Electronically Signed   By: Francene Boyers M.D.   On: 03/17/2018 13:31   US Venous Img Lower Bilateral  Result Date: 03/17/2018 CLINICAL DATA:  Chest pain EXAM: BILATERAL LOWER EXTREMITY VENOUS DOPPLER ULTRASOUND TECHNIQUE: Gray-scale sonography with graded compression, as well as color Doppler  and duplex ultrasound were performed to evaluate the lower extremity deep venous systems from the level of the common femoral vein and including the common femoral, femoral, profunda femoral, popliteal and calf veins including the posterior tibial, peroneal and gastrocnemius veins when visible. The superficial great saphenous vein was also interrogated. Spectral Doppler was utilized to evaluate flow at rest and with distal augmentation maneuvers in the common femoral, femoral and popliteal veins. COMPARISON:  None. FINDINGS: RIGHT LOWER EXTREMITY Common Femoral Vein: No evidence of thrombus. Normal compressibility, respiratory phasicity and response to augmentation. Saphenofemoral Junction: No evidence of thrombus. Normal compressibility and flow on color Doppler imaging. Profunda Femoral Vein: No evidence of thrombus. Normal compressibility and flow on color Doppler imaging. Femoral Vein: No evidence of thrombus. Normal compressibility, respiratory phasicity and response to augmentation. Popliteal Vein: No evidence of thrombus. Normal compressibility, respiratory phasicity and response to augmentation. Calf Veins: Thrombus and occlusion noted of 1 of the paired gastrocnemius veins in the right calf. Superficial Great Saphenous Vein: No evidence of thrombus. Normal compressibility. Venous Reflux:  None. Other Findings:  None. LEFT LOWER EXTREMITY Common  Femoral Vein: No evidence of thrombus. Normal compressibility, respiratory phasicity and response to augmentation. Saphenofemoral Junction: No evidence of thrombus. Normal compressibility and flow on color Doppler imaging. Profunda Femoral Vein: No evidence of thrombus. Normal compressibility and flow on color Doppler imaging. Femoral Vein: No evidence of thrombus. Normal compressibility, respiratory phasicity and response to augmentation. Popliteal Vein: No evidence of thrombus. Normal compressibility, respiratory phasicity and response to augmentation. Calf Veins: No evidence of thrombus. Normal compressibility and flow on color Doppler imaging. Superficial Great Saphenous Vein: No evidence of thrombus. Normal compressibility. Venous Reflux:  None. Other Findings:  None. IMPRESSION: Thrombosis of 1 of the paired right calf gastrocnemius veins. No DVT noted on the left. Electronically Signed   By: Charlett Nose M.D.   On: 03/17/2018 14:05    Procedures Procedures (including critical care time)  CRITICAL CARE Performed by: Canary Brim Oreoluwa Aigner Total critical care time: 35 minutes Critical care time was exclusive of separately billable procedures and treating other patients. Critical care was necessary to treat or prevent imminent or life-threatening deterioration. Critical care was time spent personally by me on the following activities: development of treatment plan with patient and/or surrogate as well as nursing, discussions with consultants, evaluation of patient's response to treatment, examination of patient, obtaining history from patient or surrogate, ordering and performing treatments and interventions, ordering and review of laboratory studies, ordering and review of radiographic studies, pulse oximetry and re-evaluation of patient's condition.   Medications Ordered in ED Medications  heparin ADULT infusion 100 units/mL (25000 units/225mL sodium chloride 0.45%) (1,300 Units/hr Intravenous  New Bag/Given 03/17/18 1443)  HYDROcodone-acetaminophen (NORCO/VICODIN) 5-325 MG per tablet 1 tablet (has no administration in time range)  iopamidol (ISOVUE-370) 76 % injection 100 mL (100 mLs Intravenous Contrast Given 03/17/18 1307)  heparin bolus via infusion 5,000 Units (5,000 Units Intravenous Bolus from Bag 03/17/18 1443)     Initial Impression / Assessment and Plan / ED Course  I have reviewed the triage vital signs and the nursing notes.  Pertinent labs & imaging results that were available during my care of the patient were reviewed by me and considered in my medical decision making (see chart for details).     Leslie Rice is a 52 y.o. female with a past medical history significant for hypercoagulable state with DVT and PE on Xarelto, GERD, migraines, hyperlipidemia, hypertension, and sleep  apnea who presents with chest pain and shortness of breath.  Patient reports that for the last month she has not taken her Xarelto.  She says that she has an unknown etiology of hypercoagulable state although others in her family have factor V Leiden.  She says that she was tested and was negative.  She reports that she had blood clot in her legs and lungs years ago and is concerned that this chest pain feels similar.  She reports that she traveled to [place] by plane last week and since that time is been having chest pain and shortness of breath.  She reports the pain in her left central chest and is pressure.  It is exertional and pleuritic.  She reports shortness of breath.  She reports no significant palpitations, nausea, vomiting, or diaphoresis.  She denies any leg pain or leg swelling however she reports that her last DVT did not have any leg symptoms.  She describes the pain as moderate to severe.  She denies other complaints.  EKG shows no STEMI or significant abnormality.  On exam, patient's chest is nontender.  No rash was seen, doubt shingles.  Lungs were clear.  No murmur.  Back was  nontender.  Patient resting comfortably on room air.  Patient will have CT PE study to further evaluate as well screening labs and troponin.  CT scan showed evidence of pulmonary embolism in all lung fields with a large clot burden and evidence of heart strain.  Patient's ultrasound of her right leg showed new DVT as well.  Patient reports that she started her Xarelto back up 2 days ago however I am concerned that this will not be adequate for anticoagulation at this time.  Patient started on heparin and will be admitted for further monitoring after her bilateral large PE with heart strain and new DVT.  Patient agreed with plan of care and was admitted for further management.  Patient did have some headache however she reports is her same headache as always on the left side of her head with no neuro deficits.  Patient had normal gait.  Low suspicion for any new hemorrhage in her head as the headache is so similar.  Patient requested Norco which was ordered.  Patient will be admitted.   Final Clinical Impressions(s) / ED Diagnoses   Final diagnoses:  Other acute pulmonary embolism, unspecified whether acute cor pulmonale present (HCC)  SOB (shortness of breath)  Precordial pain    ED Discharge Orders    None     Clinical Impression: 1. Other acute pulmonary embolism, unspecified whether acute cor pulmonale present (HCC)   2. SOB (shortness of breath)   3. Precordial pain     Disposition: Admit  This note was prepared with assistance of Dragon voice recognition software. Occasional wrong-word or sound-a-like substitutions may have occurred due to the inherent limitations of voice recognition software.     Mulki Roesler, Canary Brimhristopher J, MD 03/17/18 [phone]

## 2018-03-17 NOTE — ED Triage Notes (Signed)
Pt reports mid sternal chest pain x Tuesday when she flew to [place] and back. Pt states this pain feels like when she had her last PE years ago. Pt reports sob also, ekg performed and iv access obtained while pt being triaged.

## 2018-03-17 NOTE — Progress Notes (Signed)
52 year old female with history of factor V Leyden deficiency, chronically on Xarelto with history of PEs and DVTs presenting with new multiple pulmonary embolisms with heart strain.  Patient has been off Xarelto for the last 1 month due to financial issues.  Admitted to stepdown on IV heparin

## 2018-03-17 NOTE — Progress Notes (Signed)
ANTICOAGULATION CONSULT NOTE  Pharmacy Consult for heparin Indication: pulmonary embolus  Allergies  Allergen Reactions  . Ciprofloxacin     REACTION: vomiting  . Codeine     REACTION: vomiting  . Sulfonamide Derivatives     hives    Patient Measurements: Height: 5\' 3"  (160 cm) Weight: 230 lb (104.3 kg) IBW/kg (Calculated) : 52.4 Heparin Dosing Weight: 77.0  Vital Signs: BP: 175/97 (12/09 1927) Pulse Rate: 77 (12/09 1830)  Labs: Recent Labs    03/17/18 1203 03/17/18 2008  HGB 15.2*  --   HCT 43.1  --   PLT 176  --   APTT  --  70*  HEPARINUNFRC  --  0.94*  CREATININE 0.67  --   TROPONINI <0.03  --     Estimated Creatinine Clearance: 97.2 mL/min (by C-G formula based on SCr of 0.67 mg/dL).   Medical History: Past Medical History:  Diagnosis Date  . ALLERGIC RHINITIS   . Chronic pain syndrome    thoracic back pain, seeing Dr. Ethelene Halamos  . DEPRESSION   . DVT (deep venous thrombosis) (HCC)   . ELEVATED BLOOD PRESSURE   . GERD   . HYPERLIPIDEMIA   . INSOMNIA   . Irritable bowel syndrome   . MIGRAINE HEADACHE   . OSA (obstructive sleep apnea)    not on Cpap @ present 11-2013  . Primary hypercoagulable state (HCC)   . Pulmonary emboli [facility](HCC)         Assessment: 52 yo female with new extensive bilateral PE and history of factor V Leyden deficiency and history of DVT/PE. She has been on Xarelto PTA for previous hx PE/DVT with recent treatment gap through non-adherence.   -heparin level= 0.94, aPTT= 70  Goal of Therapy:  Heparin level 0.3-0.7 units/ml  APTT= 66-102 Monitor platelets by anticoagulation protocol: Yes   Plan:  -No heparin changes needed -Daily heparin level and CBC  Harland GermanAndrew Luismario Coston, PharmD Clinical Pharmacist **Pharmacist phone directory can now be found on [email or web address] ([email or web address]).  Listed under [facility].

## 2018-03-17 NOTE — Progress Notes (Signed)
ANTICOAGULATION CONSULT NOTE - Initial Consult  Pharmacy Consult for heparin Indication: pulmonary embolus  Allergies  Allergen Reactions  . Ciprofloxacin     REACTION: vomiting  . Codeine     REACTION: vomiting  . Sulfonamide Derivatives     hives    Patient Measurements: Height: 5\' 3"  (160 cm) Weight: 230 lb (104.3 kg) IBW/kg (Calculated) : 52.4 Heparin Dosing Weight: 77.0  Vital Signs: BP: 132/98 (12/09 1241) Pulse Rate: 70 (12/09 1241)  Labs: Recent Labs    03/17/18 1203  HGB 15.2*  HCT 43.1  PLT 176  CREATININE 0.67  TROPONINI <0.03    Estimated Creatinine Clearance: 97.2 mL/min (by C-G formula based on SCr of 0.67 mg/dL).   Medical History: Past Medical History:  Diagnosis Date  . ALLERGIC RHINITIS   . Chronic pain syndrome    thoracic back pain, seeing Dr. Ethelene Halamos  . DEPRESSION   . DVT (deep venous thrombosis) (HCC)   . ELEVATED BLOOD PRESSURE   . GERD   . HYPERLIPIDEMIA   . INSOMNIA   . Irritable bowel syndrome   . MIGRAINE HEADACHE   . OSA (obstructive sleep apnea)    not on Cpap @ present 11-2013  . Primary hypercoagulable state (HCC)   . Pulmonary emboli (HCC)     Medications:     Assessment: Multiple PE and lower extremity DVT - has been on Xarelto PTA for previous hx PE/DVT with recent treatment gap through non-adherence.  Restart Xarelto 2-3 days ago (last dose evening 8 Dec).  Platelets appear to be chronically at lower end of normal range.    Goal of Therapy:  Heparin level 0.3-0.7 units/ml Monitor platelets by anticoagulation protocol: Yes   Plan:  1. Bolus with 5,000 units heparin and continue with 1300 units per hour heparin. 2. Evaluate heparin level in 6 hours.  Shamara Soza A Kayton Ripp 03/17/2018,2:19 PM

## 2018-03-17 NOTE — Telephone Encounter (Signed)
Author phoned pt. To further encourage ED visit per Dr. Leta JunglingPaz's team. Pt. reiterated that she does not want to incur an ED or urgent care bill. Author provided urgent care resources/location, but pt. refused to pursue. "I'm going to closely monitor and if I feel worse I'll go to ED". Author stated that Dr. Drue NovelPaz may very well send her to ED based on his assessment when she arrives, especially in light of her PE hx,  for 1120AM appointment tmr., and pt. Verbalized understanding.

## 2018-03-17 NOTE — H&P (Signed)
Leslie Rice JXB:[id number] DOB: 09/25/1965 DOA: 03/17/2018     PCP: Leslie Rice, Leslie Rice, Leslie Rice   Outpatient Specialists:   NONE    Patient arrived to ER on 03/17/18 at 1139  Patient coming from: home Lives   With family    Chief Complaint:  Chief Complaint  Patient presents with  . Shortness of Breath    HPI: Leslie Rice is a 52 y.o. female with medical history significant of Chronic back pain, chronic pain syndrome, depression, hypertension, GERD, HLD, irritable bowel syndrome, migraine headaches, sleep apnea not on CPAP hypercoagulable state with prior history of recurrent  DVT/PE noncompliant with Xarelto  Presented with   1 week history of shortness of breath worse with exertion especially when she goes up the stairs associated some chest pain and lightheadedness no cold symptoms no lower extremity swelling.  She called in her primary care provider requested appointment and felt similar to prior history of PE when he she was in her 220s at that time she was on birth control pills.  [facility]. Patient had recent travel on Tuesday when [place] was [place] to [place] and back Now she is endured seen mild sternal chest pain Of note she was on Xarelto and has ran out for the past 1 month she only restarted back 2 days ago No associated cough no fevers or chills no leg pain no lower extremity edema no palpitation  Reports her job is very sedentary   Regarding pertinent Chronic problems: hypercoagulable state has been tested in the past but work up was negative family members have had recurrent blood clots   While in ER: CTA showed PE with possible Riht heart strain  Started on heparin drip while in the emergency department The following Work up has been ordered so far:  Orders Placed This Encounter  Procedures  . MRSA PCR Screening  . CT Angio Chest PE W and/or Wo Contrast  . US Venous Img Lower Bilateral  . Troponin I - ONCE - STAT  . CBC with  Differential  . Comprehensive metabolic panel  . Lipase, blood  . APTT  . Heparin level (unfractionated)  . Diet Heart Room service appropriate? Yes; Fluid consistency: Thin  . Cardiac monitoring  . Page Admitting Doctor upon patients arrival to unit/floor  . Vital signs  . Up with assistance  . Initiate Carrier Fluid Protocol  . Swab Process:  . Considerations:  . If MRSA PCR Screen is Positive:  . heparin per pharmacy consult  . Consult to hospitalist  . Inpatient consult to spiritual care  . ED EKG  . EKG 12-Lead  . Saline lock IV  . Admit to Inpatient (patient's expected length of stay will be greater than 2 midnights or inpatient only procedure)     Following Medications were ordered in ER: Medications  heparin ADULT infusion 100 units/mL (25000 units/2350mL sodium chloride 0.45%) (1,300 Units/hr Intravenous Transfusing/Transfer 03/17/18 1841)  Influenza vac split quadrivalent PF (FLUARIX) injection 0.5 mL (has no administration in time range)  iopamidol (ISOVUE-370) 76 % injection 100 mL (100 mLs Intravenous Contrast Given 03/17/18 1307)  heparin bolus via infusion 5,000 Units (5,000 Units Intravenous Bolus from Bag 03/17/18 1443)  HYDROcodone-acetaminophen (NORCO/VICODIN) 5-325 MG per tablet 1 tablet (1 tablet Oral Given 03/17/18 1628)    Significant initial  Findings: Abnormal Labs Reviewed  CBC WITH DIFFERENTIAL/PLATELET - Abnormal; Notable for the following components:  Result Value   Hemoglobin 15.2 (*)    RDW 11.4 (*)    All other components within normal limits  COMPREHENSIVE METABOLIC PANEL - Abnormal; Notable for the following components:   CO2 21 (*)    All other components within normal limits  LIPASE, BLOOD - Abnormal; Notable for the following components:   Lipase 61 (*)    All other components within normal limits  APTT - Abnormal; Notable for the following components:   aPTT 70 (*)    All other components within normal limits  HEPARIN LEVEL  (UNFRACTIONATED) - Abnormal; Notable for the following components:   Heparin Unfractionated 0.94 (*)    All other components within normal limits     Lactic Acid, Venous No results found for: LATICACIDVEN  Na 137 K 3.8  Cr   stable,  Lab Results  Component Value Date   CREATININE 0.67 03/17/2018   CREATININE 0.75 11/05/2016   CREATININE 0.79 04/08/2015    lipase 61  WBC  6.5  HG/HCT stable,    Component Value Date/Time   HGB 15.2 (H) 03/17/2018 1203   HCT 43.1 03/17/2018 1203   Troponin (Point of Care Test) No results for input(s): TROPIPOC in the last 72 hours.   BNP (last 3 results) No results for input(s): BNP in the last 8760 hours.  ProBNP (last 3 results) No results for input(s): PROBNP in the last 8760 hours.   UA   not ordered    CTabd/pelvis - *nonacute CTA -showed evidence of pulmonary embolism clot burden and evidence of heart strain patient   ultrasound on the right lower extremity showed new DVT Thrombosis of 1 of the paired right calf gastrocnemius veins.  ECG:  Personally reviewed by me showing: HR : 76 Rhythm NSR, abnormal R wave progression  nonspecific changes,   QTC 438     ED Triage Vitals  Enc Vitals Group     BP 03/17/18 1156 (!) 150/105     Pulse Rate 03/17/18 1156 75     Resp 03/17/18 1156 15     Temp --      Temp src --      SpO2 03/17/18 1156 97 %     Weight 03/17/18 1149 230 lb (104.3 kg)     Height 03/17/18 1149 5\' 3"  (1.6 m)     Head Circumference --      Peak Flow --      Pain Score 03/17/18 1148 4     Pain Loc --      Pain Edu? --      Excl. in GC? --   TMAX(24)@       Latest  Blood pressure (!) 175/97, pulse 77, resp. rate 13, height 5\' 3"  (1.6 m), weight 104.3 kg, SpO2 96 %.   Hospitalist was called for admission for bilateral PE   Review of Systems:    Pertinent positives include:  chest pain,shortness of breath at rest. dyspnea on exertion, back pain.   Constitutional:  No weight loss, night sweats,  Fevers, chills, fatigue, weight loss  HEENT:  No headaches, Difficulty swallowing,Tooth/dental problems,Sore throat,  No sneezing, itching, ear ache, nasal congestion, post nasal drip,  Cardio-vascular:  No Orthopnea, PND, anasarca, dizziness, palpitations.no Bilateral lower extremity swelling  GI:  No heartburn, indigestion, abdominal pain, nausea, vomiting, diarrhea, change in bowel habits, loss of appetite, melena, blood in stool, hematemesis Resp:  no  No excess mucus, no productive cough, No non-productive cough, No coughing up of  blood.No change in color of mucus.No wheezing. Skin:  no rash or lesions. No jaundice GU:  no dysuria, change in color of urine, no urgency or frequency. No straining to urinate.  No flank pain.  Musculoskeletal:  No joint pain or no joint swelling. No decreased range of motion. No Psych:  No change in mood or affect. No depression or anxiety. No memory loss.  Neuro: no localizing neurological complaints, no tingling, no weakness, no double vision, no gait abnormality, no slurred speech, no confusion  All systems reviewed and apart from HOPI all are negative  Past Medical History:   Past Medical History:  Diagnosis Date  . ALLERGIC RHINITIS   . Chronic pain syndrome    thoracic back pain, seeing Dr. Ethelene Hal  . DEPRESSION   . DVT (deep venous thrombosis) (HCC)   . ELEVATED BLOOD PRESSURE   . GERD   . HYPERLIPIDEMIA   . INSOMNIA   . Irritable bowel syndrome   . MIGRAINE HEADACHE   . OSA (obstructive sleep apnea)    not on Cpap @ present 11-2013  . Primary hypercoagulable state (HCC)   . Pulmonary emboli [facility])      Past Surgical History:  Procedure Laterality Date  . c section     x 2   . CARPAL TUNNEL RELEASE     B  . NASAL SINUS SURGERY      Social History:  Ambulatory   Independently     reports that she has never smoked. She has never used smokeless tobacco. She reports that she drinks alcohol. She reports that she does not use  drugs.   Family History:   Family History  Problem Relation Age of Onset  . Colon cancer Other        M, aunt, nephew  . CAD Father        age? "silent MIs"  . Lung cancer Father   . Diabetes Father        F, sisters x2  . Thyroid cancer Sister   . Colon cancer Sister        dx age 47  . Breast cancer Neg Hx   . Stroke Neg Hx     Allergies: Allergies  Allergen Reactions  . Ciprofloxacin     REACTION: vomiting  . Codeine     REACTION: vomiting  . Sulfonamide Derivatives     hives     Prior to Admission medications   Medication Sig Start Date End Date Taking? Authorizing Provider  AJOVY 225 MG/1.5ML SOSY INJECT 1 PEN INTO THE SKIN EVERY 30 DAYS 02/13/18   Dohmeier, Porfirio Mylar, Leslie Rice  ALPRAZolam Prudy Feeler) 0.5 MG tablet Take 1 tablet (0.5 mg total) by mouth 2 (two) times daily as needed. 09/13/17   Leslie Plump, Leslie Rice  butalbital-acetaminophen-caffeine (FIORICET, ESGIC) [phone] MG tablet TAKE 1 TO 2 TABLETS BY MOUTH EVERY 6 HOURS AS NEEDED FOR HEADACHE 03/08/17   Leslie Plump, Leslie Rice  HYDROcodone-acetaminophen [facility]) 10-325 MG per tablet Take 1 tablet by mouth every 6 (six) hours as needed.    Provider, Historical, Leslie Rice  hydrOXYzine (ATARAX/VISTARIL) 25 MG tablet Take 1 tablet (25 mg total) by mouth 3 (three) times daily as needed for itching. 09/04/17   Saguier, Ramon Dredge, PA-C  levonorgestrel (MIRENA) 20 MCG/24HR IUD 1 each by Intrauterine route once.    Provider, Historical, Leslie Rice  morphine (MS CONTIN) 15 MG 12 hr tablet Take 15 mg by mouth 2 (two) times daily.    Provider, Historical, Leslie Rice  predniSONE (DELTASONE) 20 MG tablet 4 tab daily for 2 days 09/04/17   Saguier, Ramon Dredge, PA-C  promethazine (PHENERGAN) 25 MG tablet Take 1 tablet (25 mg total) by mouth every 6 (six) hours as needed for nausea or vomiting. 03/13/17   Leslie Plump, Leslie Rice  rivaroxaban (XARELTO) 20 MG TABS tablet Take 1 tablet (20 mg total) by mouth daily with supper. 04/24/17   Leslie Plump, Leslie Rice  sertraline (ZOLOFT) 100 MG tablet Take 0.5  tablets (50 mg total) by mouth daily. 05/27/17   Leslie Plump, Leslie Rice  SUMAtriptan (IMITREX) 50 MG tablet One tablet by mouth at start of migraine.  May repeat in 2 hours if no improvement x 1 dose. 09/21/16   Leslie Plump, Leslie Rice   Physical Exam: Blood pressure (!) 175/97, pulse 77, resp. rate 13, height 5\' 3"  (1.6 m), weight 104.3 kg, SpO2 96 %. 1. General:  in No Acute distress  Chronically ill   -appearing 2. Psychological: Alert and  Oriented 3. Head/ENT:   Moist Mucous Membranes                          Head Non traumatic, neck supple                            Poor Dentition 4. SKIN: normal  Skin turgor,  Skin clean Dry and intact no rash 5. Heart: Regular rate and rhythm no Murmur, no Rub or gallop 6. Lungs: Clear to auscultation bilaterally, no wheezes or crackles   7. Abdomen: Soft,  non-tender, Non distended  obese  bowel sounds present 8. Lower extremities: no clubbing, cyanosis, or  edema 9. Neurologically Grossly intact, moving all 4 extremities equally   10. MSK: Normal range of motion   LABS:     Recent Labs  Lab 03/17/18 1203  WBC 6.5  NEUTROABS 3.7  HGB 15.2*  HCT 43.1  MCV 89.8  PLT 176   Basic Metabolic Panel: Recent Labs  Lab 03/17/18 1203  NA 137  K 3.8  CL 107  CO2 21*  GLUCOSE 93  BUN 11  CREATININE 0.67  CALCIUM 9.1      Recent Labs  Lab 03/17/18 1203  AST 20  ALT 17  ALKPHOS 49  BILITOT 0.9  PROT 7.7  ALBUMIN 4.8   Recent Labs  Lab 03/17/18 1203  LIPASE 61*   No results for input(s): AMMONIA in the last 168 hours.    HbA1C: No results for input(s): HGBA1C in the last 72 hours. CBG: No results for input(s): GLUCAP in the last 168 hours.    Urine analysis:    Component Value Date/Time   COLORURINE YELLOW 09/30/2015 1029   APPEARANCEUR CLOUDY (A) 09/30/2015 1029   LABSPEC 1.025 09/30/2015 1029   PHURINE 5.0 09/30/2015 1029   GLUCOSEU NEGATIVE 09/30/2015 1029   HGBUR TRACE-LYSED (A) 09/30/2015 1029   BILIRUBINUR NEGATIVE  09/30/2015 1029   KETONESUR NEGATIVE 09/30/2015 1029   UROBILINOGEN 0.2 09/30/2015 1029   NITRITE NEGATIVE 09/30/2015 1029   LEUKOCYTESUR NEGATIVE 09/30/2015 1029      Cultures: No results found for: SDES, SPECREQUEST, CULT, REPTSTATUS   Radiological Exams on Admission: Ct Angio Chest Pe W And/or Wo Contrast  Result Date: 03/17/2018 CLINICAL DATA:  Chest pain and shortness of breath. History of pulmonary emboli. EXAM: CT ANGIOGRAPHY CHEST WITH CONTRAST TECHNIQUE: Multidetector CT imaging of the chest was performed using the  standard protocol during bolus administration of intravenous contrast. Multiplanar CT image reconstructions and MIPs were obtained to evaluate the vascular anatomy. CONTRAST:  ISOVUE-370 IOPAMIDOL (ISOVUE-370) INJECTION 76% COMPARISON:  Report of CT scan dated 12/07/2008 FINDINGS: Cardiovascular: There multiple bilateral pulmonary emboli involving all lobes of both lungs. RV LV ratio is 1.0. Heart size is normal. No pericardial effusion. Mediastinum/Nodes: No enlarged mediastinal, hilar, or axillary lymph nodes. Thyroid gland, trachea, and esophagus demonstrate no significant findings. Lungs/Pleura: Lungs are clear. No pleural effusion or pneumothorax. Upper Abdomen: Normal. Musculoskeletal: No chest wall abnormality. No acute or significant osseous findings. Review of the MIP images confirms the above findings. IMPRESSION: Extensive bilateral pulmonary emboli. Slightly elevated RV/LV ratio. Critical Value/emergent results were called by telephone at the time of interpretation on 03/17/2018 at 1:31 pm to Dr. Lynden Oxford , who verbally acknowledged these results. Electronically Signed   By: Francene Boyers M.D.   On: 03/17/2018 13:31   US Venous Img Lower Bilateral  Result Date: 03/17/2018 CLINICAL DATA:  Chest pain EXAM: BILATERAL LOWER EXTREMITY VENOUS DOPPLER ULTRASOUND TECHNIQUE: Gray-scale sonography with graded compression, as well as color Doppler and duplex  ultrasound were performed to evaluate the lower extremity deep venous systems from the level of the common femoral vein and including the common femoral, femoral, profunda femoral, popliteal and calf veins including the posterior tibial, peroneal and gastrocnemius veins when visible. The superficial great saphenous vein was also interrogated. Spectral Doppler was utilized to evaluate flow at rest and with distal augmentation maneuvers in the common femoral, femoral and popliteal veins. COMPARISON:  None. FINDINGS: RIGHT LOWER EXTREMITY Common Femoral Vein: No evidence of thrombus. Normal compressibility, respiratory phasicity and response to augmentation. Saphenofemoral Junction: No evidence of thrombus. Normal compressibility and flow on color Doppler imaging. Profunda Femoral Vein: No evidence of thrombus. Normal compressibility and flow on color Doppler imaging. Femoral Vein: No evidence of thrombus. Normal compressibility, respiratory phasicity and response to augmentation. Popliteal Vein: No evidence of thrombus. Normal compressibility, respiratory phasicity and response to augmentation. Calf Veins: Thrombus and occlusion noted of 1 of the paired gastrocnemius veins in the right calf. Superficial Great Saphenous Vein: No evidence of thrombus. Normal compressibility. Venous Reflux:  None. Other Findings:  None. LEFT LOWER EXTREMITY Common Femoral Vein: No evidence of thrombus. Normal compressibility, respiratory phasicity and response to augmentation. Saphenofemoral Junction: No evidence of thrombus. Normal compressibility and flow on color Doppler imaging. Profunda Femoral Vein: No evidence of thrombus. Normal compressibility and flow on color Doppler imaging. Femoral Vein: No evidence of thrombus. Normal compressibility, respiratory phasicity and response to augmentation. Popliteal Vein: No evidence of thrombus. Normal compressibility, respiratory phasicity and response to augmentation. Calf Veins: No evidence  of thrombus. Normal compressibility and flow on color Doppler imaging. Superficial Great Saphenous Vein: No evidence of thrombus. Normal compressibility. Venous Reflux:  None. Other Findings:  None. IMPRESSION: Thrombosis of 1 of the paired right calf gastrocnemius veins. No DVT noted on the left. Electronically Signed   By: Charlett Nose M.D.   On: 03/17/2018 14:05    Chart has been reviewed    Assessment/Plan  52 y.o. female with medical history significant of Chronic back pain, chronic pain syndrome, depression, hypertension, GERD, HLD, irritable bowel syndrome, migraine headaches, sleep apnea not on CPAP hypercoagulable state with prior history of DVT/PE noncompliant with Xarelto Admitted for bilateral PE  Present on Admission: . Pulmonary embolism (HCC) -  Admit to  stepdown Initiate heparin drip   long term anticoagulation on  xarelto not a Xarelto failure given non-compliance spoke about importance of taking her medications.  Hold home blood pressure medications avoid hypotension Cycle cardiac enzymes Order echogram and lower extremity Dopplers  Most likely risk factors for hypercoagulable state being  obesity unclear underlying hypercoagulable condition   . Hyperlipidemia -check lipid panel .  Primary hypercoagulable state (change coumadin to xarelto 05-2014) -will need to reestablish follow-up with hematology as an outpatient . Chronic pain syndrome (hydrocodone per Dr. Ethelene Hal) continue home medications . Migraine headache currently stable continue home medications . OSA (obstructive sleep apnea) not on CPAP at baseline   Other plan as per orders.  DVT prophylaxis:  heparin   Code Status:  FULL CODE  as per patient  I had personally discussed CODE STATUS with patient      Family Communication:   Family not at  Bedside   Disposition Plan:      To home once workup is complete and patient is stable                                        Consults called: none    Admission  status:   inpatient     Expect 2 midnight stay secondary to severity of patient's current illness including      Severe lab/radiological abnormalities including:   Large bilateral PE  .  I expect  patient to be hospitalized for 2 midnights requiring inpatient medical care.  Patient is at high risk for adverse outcome (such as loss of life or disability) if not treated.  Indication for inpatient stay as follows:     persistent chest pain despite medical management   Need for    IV medications     Level of care          SDU tele indefinitely please discontinue once patient no longer qualifies    Atwell Mcdanel 03/17/2018, 11:05 PM    [facility]  Pager [phone]   after 2 AM please page floor coverage PA If 7AM-7PM, please contact the day team taking care of the patient  [email or web address]  Password [email or web address]

## 2018-03-18 ENCOUNTER — Ambulatory Visit: Payer: 59 | Admitting: Internal Medicine

## 2018-03-18 ENCOUNTER — Other Ambulatory Visit (HOSPITAL_COMMUNITY): Payer: 59

## 2018-03-18 DIAGNOSIS — Z23 Encounter for immunization: Secondary | ICD-10-CM | POA: Diagnosis not present

## 2018-03-18 DIAGNOSIS — I2699 Other pulmonary embolism without acute cor pulmonale: Secondary | ICD-10-CM | POA: Diagnosis not present

## 2018-03-18 DIAGNOSIS — R0602 Shortness of breath: Secondary | ICD-10-CM | POA: Diagnosis not present

## 2018-03-18 DIAGNOSIS — G894 Chronic pain syndrome: Secondary | ICD-10-CM | POA: Diagnosis not present

## 2018-03-18 LAB — CBC
HCT: 38.1 % (ref 36.0–46.0)
Hemoglobin: 13.2 g/dL (ref 12.0–15.0)
MCH: 30.7 pg (ref 26.0–34.0)
MCHC: 34.6 g/dL (ref 30.0–36.0)
MCV: 88.6 fL (ref 80.0–100.0)
NRBC: 0 % (ref 0.0–0.2)
Platelets: 141 10*3/uL — ABNORMAL LOW (ref 150–400)
RBC: 4.3 MIL/uL (ref 3.87–5.11)
RDW: 11.4 % — ABNORMAL LOW (ref 11.5–15.5)
WBC: 5 10*3/uL (ref 4.0–10.5)

## 2018-03-18 LAB — COMPREHENSIVE METABOLIC PANEL
ALT: 16 U/L (ref 0–44)
AST: 18 U/L (ref 15–41)
Albumin: 3.7 g/dL (ref 3.5–5.0)
Alkaline Phosphatase: 43 U/L (ref 38–126)
Anion gap: 11 (ref 5–15)
BILIRUBIN TOTAL: 1.1 mg/dL (ref 0.3–1.2)
BUN: 9 mg/dL (ref 6–20)
CO2: 20 mmol/L — ABNORMAL LOW (ref 22–32)
Calcium: 8.5 mg/dL — ABNORMAL LOW (ref 8.9–10.3)
Chloride: 111 mmol/L (ref 98–111)
Creatinine, Ser: 0.85 mg/dL (ref 0.44–1.00)
GFR calc non Af Amer: >60 mL/min (ref >60)
Glucose, Bld: 98 mg/dL (ref 70–99)
Potassium: 3.7 mmol/L (ref 3.5–5.1)
Sodium: 142 mmol/L (ref 135–145)
Total Protein: 6.3 g/dL — ABNORMAL LOW (ref 6.5–8.1)

## 2018-03-18 LAB — LIPID PANEL
Cholesterol: 197 mg/dL (ref 0–200)
HDL: 32 mg/dL — ABNORMAL LOW (ref >40)
LDL Cholesterol: 123 mg/dL — ABNORMAL HIGH (ref 0–99)
TRIGLYCERIDES: 212 mg/dL — AB (ref <150)
Total CHOL/HDL Ratio: 6.2 RATIO
VLDL: 42 mg/dL — ABNORMAL HIGH (ref 0–40)

## 2018-03-18 LAB — TROPONIN I: Troponin I: <0.03 ng/mL (ref <0.03)

## 2018-03-18 LAB — HIV ANTIBODY (ROUTINE TESTING W REFLEX): HIV Screen 4th Generation wRfx: NONREACTIVE

## 2018-03-18 LAB — PHOSPHORUS: Phosphorus: 4.5 mg/dL (ref 2.5–4.6)

## 2018-03-18 LAB — HEPARIN LEVEL (UNFRACTIONATED): Heparin Unfractionated: 0.59 IU/mL (ref 0.30–0.70)

## 2018-03-18 LAB — APTT: aPTT: 62 seconds — ABNORMAL HIGH (ref 24–36)

## 2018-03-18 LAB — TSH: TSH: 3.651 u[IU]/mL (ref 0.350–4.500)

## 2018-03-18 LAB — MAGNESIUM: Magnesium: 1.9 mg/dL (ref 1.7–2.4)

## 2018-03-18 MED ORDER — HEPARIN BOLUS VIA INFUSION
1200.0000 [IU] | Freq: Once | INTRAVENOUS | Status: AC
  Administered 2018-03-18: 1200 [IU] via INTRAVENOUS
  Filled 2018-03-18: qty 1200

## 2018-03-18 MED ORDER — RIVAROXABAN 15 MG PO TABS: 15.0000 mg | ORAL_TABLET | Freq: Two times a day (BID) | ORAL | Status: DC

## 2018-03-18 MED ORDER — RIVAROXABAN (XARELTO) VTE STARTER PACK (15 & 20 MG): ORAL_TABLET | ORAL | 0 refills | Status: DC

## 2018-03-18 MED ORDER — RIVAROXABAN 15 MG PO TABS
15.0000 mg | ORAL_TABLET | Freq: Two times a day (BID) | ORAL | Status: DC
  Administered 2018-03-18: 15 mg via ORAL
  Filled 2018-03-18: qty 1

## 2018-03-18 NOTE — Plan of Care (Signed)
  Problem: Activity: Goal: Risk for activity intolerance will decrease Outcome: Progressing   Problem: Safety: Goal: Ability to remain free from injury will improve Outcome: Progressing   

## 2018-03-18 NOTE — Plan of Care (Signed)
  Problem: Education: Goal: Knowledge of General Education information will improve Description Including pain rating scale, medication(s)/side effects and non-pharmacologic comfort measures Outcome: Progressing Note:  POC and orders reviewed with pt.   

## 2018-03-18 NOTE — Progress Notes (Signed)
ANTICOAGULATION CONSULT NOTE  Pharmacy Consult for heparin Indication: pulmonary embolus  Allergies  Allergen Reactions  . Ciprofloxacin     REACTION: vomiting  . Codeine     REACTION: vomiting  . Sulfonamide Derivatives     hives    Patient Measurements: Height: 5\' 3"  (160 cm) Weight: 230 lb (104.3 kg) IBW/kg (Calculated) : 52.4 Heparin Dosing Weight: 77.0  Vital Signs: Temp: 98.5 F (36.9 C) (12/10 0307) Temp Source: Oral (12/10 0307) BP: 124/85 (12/10 0307) Pulse Rate: 76 (12/10 0307)  Labs: Recent Labs    03/17/18 1203 03/17/18 2008 03/17/18 2141 03/18/18 0347  HGB 15.2*  --   --  13.2  HCT 43.1  --   --  38.1  PLT 176  --   --  141*  APTT  --  70*  --  62*  HEPARINUNFRC  --  0.94*  --  0.59  CREATININE 0.67  --   --  0.85  TROPONINI <0.03  --  <0.03 <0.03    Estimated Creatinine Clearance: 91.5 mL/min (by C-G formula based on SCr of 0.85 mg/dL).   Medical History: Past Medical History:  Diagnosis Date  . ALLERGIC RHINITIS   . Chronic pain syndrome    thoracic back pain, seeing Dr. Ethelene Halamos  . DEPRESSION   . DVT (deep venous thrombosis) (HCC)   . ELEVATED BLOOD PRESSURE   . GERD   . HYPERLIPIDEMIA   . INSOMNIA   . Irritable bowel syndrome   . MIGRAINE HEADACHE   . OSA (obstructive sleep apnea)    not on Cpap @ present 11-2013  . Primary hypercoagulable state (HCC)   . Pulmonary emboli [facility](HCC)         Assessment: 52 yo female with new extensive bilateral PE and history of factor V Leyden deficiency and history of DVT/PE. She has been on Xarelto PTA for previous hx PE/DVT with recent treatment gap through non-adherence.   -heparin level= 0.59, aPTT= 62, not correlating  Goal of Therapy:  Heparin level 0.3-0.7 units/ml  APTT= 66-102 Monitor platelets by anticoagulation protocol: Yes   Plan:  -Heparin 1200 unit bolus -Increase heparin drip to 1450 units/hr HL/APTT in 6 hours -Daily heparin level and CBC  Anaise Sterbenz A. Jeanella CrazePierce, PharmD,  BCPS Clinical Pharmacist  Pager: [phone] Please utilize Amion for appropriate phone number to reach the unit pharmacist [facility])

## 2018-03-18 NOTE — Evaluation (Signed)
Physical Therapy Evaluation Patient Details Name: Leslie Rice MRN: [id number] DOB: 11-08-1965 Today's Date: 03/18/2018   History of Present Illness  Patient is a 52 y/o female presenting to the ED on 03/17/18 with primary complaints of SOB. CTA showed PE with possible Right heart strain. PMH significant for Chronic back pain, chronic pain syndrome, depression, hypertension, GERD, HLD, irritable bowel syndrome, migraine headaches, sleep apnea not on CPAP hypercoagulable state with prior history of recurrent  DVT/PE noncompliant with Xarelto.    Clinical Impression  Leslie Rice is a very pleasant 52 y/o female admitted with the above listed diagnosis. Patient reports that prior to admission she was IND with all mobility and ADLs. PT speaking with nursing prior to evaluation with nursing stating MD clearing PT to evaluate, as well as heparin being within therapeutic ranges. Evaluation limited to in room mobility per activity orders. Patient performing all mobility at Mod I/Sup for safety with no LOB or need for physical assist. No further acute PT needs identified. PT to sign off.     Follow Up Recommendations No PT follow up    Equipment Recommendations  None recommended by PT    Recommendations for Other Services       Precautions / Restrictions Precautions Precautions: Fall Restrictions Weight Bearing Restrictions: No      Mobility  Bed Mobility Overal bed mobility: Modified Independent                Transfers Overall transfer level: Modified independent Equipment used: None                Ambulation/Gait Ambulation/Gait assistance: Supervision Gait Distance (Feet): 15 Feet Assistive device: None Gait Pattern/deviations: Step-through pattern;Decreased stride length Gait velocity: WNL   General Gait Details: limited to in room mobility per activity orders; able to ambualte to/from restroom without issue of LOB; no physical assist needed  Stairs             Wheelchair Mobility    Modified Rankin (Stroke Patients Only)       Balance Overall balance assessment: Modified Independent                                           Pertinent Vitals/Pain Pain Assessment: No/denies pain    Home Living Family/patient expects to be discharged to:: Private residence Living Arrangements: Children Available Help at Discharge: Family;Available PRN/intermittently Type of Home: House Home Access: (threshold)     Home Layout: Two level;Able to live on main level with bedroom/bathroom Home Equipment: None      Prior Function Level of Independence: Independent         Comments: works full time; sedentary     Higher education careers adviserHand Dominance        Extremity/Trunk Assessment   Upper Extremity Assessment Upper Extremity Assessment: Overall WFL for tasks assessed    Lower Extremity Assessment Lower Extremity Assessment: Overall WFL for tasks assessed    Cervical / Trunk Assessment Cervical / Trunk Assessment: Normal  Communication   Communication: No difficulties  Cognition Arousal/Alertness: Awake/alert Behavior During Therapy: WFL for tasks assessed/performed Overall Cognitive Status: Within Functional Limits for tasks assessed                                        General Comments General  comments (skin integrity, edema, etc.): patient wanting to return home as soon as possible    Exercises     Assessment/Plan    PT Assessment Patent does not need any further PT services  PT Problem List         PT Treatment Interventions      PT Goals (Current goals can be found in the Care Plan section)  Acute Rehab PT Goals Patient Stated Goal: return home today PT Goal Formulation: With patient Time For Goal Achievement: 03/25/18 Potential to Achieve Goals: Good    Frequency     Barriers to discharge        Co-evaluation               AM-PAC PT "6 Clicks" Mobility  Outcome Measure Help  needed turning from your back to your side while in a flat bed without using bedrails?: None Help needed moving from lying on your back to sitting on the side of a flat bed without using bedrails?: None Help needed moving to and from a bed to a chair (including a wheelchair)?: None Help needed standing up from a chair using your arms (e.g., wheelchair or bedside chair)?: None Help needed to walk in hospital room?: A Little Help needed climbing 3-5 steps with a railing? : A Little 6 Click Score: 22    End of Session Equipment Utilized During Treatment: Gait belt Activity Tolerance: Patient tolerated treatment well Patient left: in bed;with call bell/phone within reach Nurse Communication: Mobility status PT Visit Diagnosis: Unsteadiness on feet (R26.81)    Time: 1610-9604 PT Time Calculation (min) (ACUTE ONLY): 14 min   Charges:   PT Evaluation $PT Eval Moderate Complexity: 1 Mod         Kipp Laurence, PT, DPT Supplemental Physical Therapist 03/18/18 10:17 AM Pager: [phone] Office: [phone]

## 2018-03-18 NOTE — Progress Notes (Signed)
   03/18/18 0935  Clinical Encounter Type  Visited With Patient;Health care provider  Visit Type Initial  Referral From Physician  Consult/Referral To Chaplain;Other (Comment) (AD)  The chaplain responded to MD spiritual care request for Pt. AD.  The Pt. was preparing for therapy when the chaplain entered the room.  The chaplain left the AD educational document for the Pt. to review. In preliminary discussion with the Pt., the Pt. shared her adult daughters may be HCPOAs.  The chaplain will F/U with the Pt. on Wednesday.

## 2018-03-18 NOTE — Care Management Note (Signed)
Case Management Note  Patient Details  Name: Leslie Rice MRN: [id number] Date of Birth: 23-Jan-1966  Subjective/Objective:  From home, pta indep, was on xarelto pta, her refill will be 50.00 copay , NCM gave patient a 10.00 co pay coupon also.                   Action/Plan: DC when ready.  Expected Discharge Date:  03/18/18               Expected Discharge Plan:  Home/Self Care  In-House Referral:     Discharge planning Services  CM Consult, Medication Assistance  Post Acute Care Choice:    Choice offered to:     DME Arranged:    DME Agency:     HH Arranged:    HH Agency:     Status of Service:  Completed, signed off  If discussed at [employer] Length of Stay Meetings, dates discussed:    Additional Comments:  Leone Havenaylor, Secret Kristensen Clinton, RN 03/18/2018, 12:20 PM

## 2018-03-18 NOTE — Progress Notes (Signed)
ANTICOAGULATION CONSULT NOTE - Initial Consult  Pharmacy Consult for rivaroxaban Indication: PE  Allergies  Allergen Reactions  . Ciprofloxacin     REACTION: vomiting  . Codeine     REACTION: vomiting  . Sulfonamide Derivatives     hives    Patient Measurements: Height: 5\' 3"  (160 cm) Weight: 230 lb (104.3 kg) IBW/kg (Calculated) : 52.4   Vital Signs: Temp: 98.5 F (36.9 C) (12/10 0307) Temp Source: Oral (12/10 0307) BP: 124/85 (12/10 0307) Pulse Rate: 76 (12/10 0307)  Labs: Recent Labs    03/17/18 1203 03/17/18 2008 03/17/18 2141 03/18/18 0347  HGB 15.2*  --   --  13.2  HCT 43.1  --   --  38.1  PLT 176  --   --  141*  APTT  --  70*  --  62*  HEPARINUNFRC  --  0.94*  --  0.59  CREATININE 0.67  --   --  0.85  TROPONINI <0.03  --  <0.03 <0.03    Estimated Creatinine Clearance: 91.5 mL/min (by C-G formula based on SCr of 0.85 mg/dL).   Medical History: Past Medical History:  Diagnosis Date  . ALLERGIC RHINITIS   . Chronic pain syndrome    thoracic back pain, seeing Dr. Ethelene Halamos  . DEPRESSION   . DVT (deep venous thrombosis) (HCC)   . ELEVATED BLOOD PRESSURE   . GERD   . HYPERLIPIDEMIA   . INSOMNIA   . Irritable bowel syndrome   . MIGRAINE HEADACHE   . OSA (obstructive sleep apnea)    not on Cpap @ present 11-2013  . Primary hypercoagulable state (HCC)   . Pulmonary emboli [facility](HCC)     Assessment: 52 yo female with new extensive bilateral PE and history of factor V Leyden deficiency and history of DVT/PE. She has been on Xarelto PTA for previous hx PE/DVT with recent treatment gap through non-adherence.  MD wishes to restart xarelto at this time.   Patient previously on heparin drip with recent bolus this AM. Will reload xarelto at 1000 and turn heparin drip off at this time  Goal of Therapy:  Monitor platelets by anticoagulation protocol: Yes   Plan:  Xarelto 15mg  PO BID for 21 days, then 20mg  daily thereafter. D/C heparin drip once first dose of  xarelto given Monitor for s/sx of bleeding  Sanaz Scarlett A. Jeanella CrazePierce, PharmD, BCPS Clinical Pharmacist [place] Pager: [phone] Please utilize Amion for appropriate phone number to reach the unit pharmacist [facility])   03/18/2018,7:58 AM

## 2018-03-20 NOTE — Discharge Summary (Signed)
[facility] Discharge Summary   Patient: Leslie Rice ZOX:[id number]   PCP: Wanda PlumpPaz, Jose E, MD DOB: 1965-08-17   Date of admission: 03/17/2018   Date of discharge: 03/18/2018     Discharge Diagnoses:  Principal diagnosis Recurrent pulmonary embolism  Active Problems:   Hyperlipidemia   *Primary hypercoagulable state (change coumadin to xarelto 05-2014)   *Chronic pain syndrome (hydrocodone per Dr. Ethelene Halamos)   Migraine headache   OSA (obstructive sleep apnea)   Pulmonary embolism (HCC)   Admitted From: Home Disposition: Home  Recommendations for Outpatient Follow-up:  1. Please follow-up with PCP in 1 week, patient will need further refills for anticoagulation  Follow-up Information    Wanda PlumpPaz, Jose E, MD. Schedule an appointment as soon as possible for a visit in 1 week(s).   Specialty:  Internal Medicine Contact information: [phone] [place] [id number] [phone]          Diet recommendation: Cardiac diet  Activity: The patient is advised to gradually reintroduce usual activities.  Discharge Condition: good  Code Status: Full code  History of present illness: As per the H and P dictated on admission, "Leslie HewsWendy L Kalman is a [age] y.o. female with medical history significant of Chronic back pain, chronic pain syndrome, depression, hypertension, GERD, HLD, irritable bowel syndrome, migraine headaches, sleep apnea not on CPAP hypercoagulable state with prior history of recurrent  DVT/PE noncompliant with Xarelto  Presented with   1 week history of shortness of breath worse with exertion especially when she goes up the stairs associated some chest pain and lightheadedness no cold symptoms no lower extremity swelling.  She called in her primary care provider requested appointment and felt similar to prior history of PE when he she was in her 3220s at that time she was on birth control pills.  [facility]. Patient  had recent travel on Tuesday when [place] was [place] to [place] and back Now she is endured seen mild sternal chest pain Of note she was on Xarelto and has ran out for the past 1 month she only restarted back 2 days ago No associated cough no fevers or chills no leg pain no lower extremity edema no palpitation  Reports her job is very sedentary"  Hospital Course:  Summary of her active problems in the hospital is as following. Recurrent pulmonary embolism   presented with complaints of chest pain or shortness of breath. CT angiogram was performed which was positive for acute pulmonary embolism bilaterally. CT scan was also positive for mild right heart strain.  Patient was referred for admission for close monitoring Patient was not hypoxic or tachycardic. Serial troponins were negative. Lower extremity Doppler was also positive for DVT as well. Primary reason for patient's recurrent DVT was unable to get Xarelto due to problems with her insurance. Case manager was consulted, medication was prescribed to patient's pharmacy and they were able to fill and the prescription.  Due to patient being off of Xarelto for a long time it was decided that the patient should be started again with a loading dose of Xarelto.  Chronic pain syndrome. Continue home medication.  Obstructive sleep apnea. Morbid obesity. Body mass index is 40.74 kg/m.  Not on CPAP at baseline.  Dyslipidemia. Continue home regimen.  All other chronic medical condition were stable during the hospitalization.  Patient was ambulatory without any assistance. On the day of the discharge the patient's vitals were stable ,  and no other acute medical condition were reported by patient. the patient was felt safe to be discharge at home with family.  Consultants: none Procedures: none  DISCHARGE MEDICATION: Allergies as of 03/18/2018      Reactions   Ciprofloxacin    REACTION: vomiting   Codeine    REACTION: vomiting    Sulfonamide Derivatives    hives      Medication List    STOP taking these medications   predniSONE 20 MG tablet Commonly known as:  DELTASONE   rivaroxaban 20 MG Tabs tablet Commonly known as:  XARELTO Replaced by:  Rivaroxaban 15 & 20 MG Tbpk     TAKE these medications   AJOVY 225 MG/1.5ML Sosy Generic drug:  Fremanezumab-vfrm INJECT 1 PEN INTO THE SKIN EVERY 30 DAYS What changed:  See the new instructions.   ALPRAZolam 0.5 MG tablet Commonly known as:  XANAX Take 1 tablet (0.5 mg total) by mouth 2 (two) times daily as needed. What changed:  reasons to take this   butalbital-acetaminophen-caffeine 50-325-40 MG tablet Commonly known as:  FIORICET, ESGIC TAKE 1 TO 2 TABLETS BY MOUTH EVERY 6 HOURS AS NEEDED FOR HEADACHE What changed:  See the new instructions.   HYDROcodone-acetaminophen 10-325 MG tablet Commonly known as:  NORCO Take 1 tablet by mouth every 6 (six) hours as needed for moderate pain.   hydrOXYzine 25 MG tablet Commonly known as:  ATARAX/VISTARIL Take 1 tablet (25 mg total) by mouth 3 (three) times daily as needed for itching.   levonorgestrel 20 MCG/24HR IUD Commonly known as:  MIRENA 1 each by Intrauterine route once.   promethazine 25 MG tablet Commonly known as:  PHENERGAN Take 1 tablet (25 mg total) by mouth every 6 (six) hours as needed for nausea or vomiting.   Rivaroxaban 15 & 20 MG Tbpk Take as directed on package: Start with one 15mg  tablet by mouth twice a day with food. On Day 22, switch to one 20mg  tablet once a day with food. Replaces:  rivaroxaban 20 MG Tabs tablet   sertraline 100 MG tablet Commonly known as:  ZOLOFT Take 0.5 tablets (50 mg total) by mouth daily.   SUMAtriptan 50 MG tablet Commonly known as:  IMITREX One tablet by mouth at start of migraine.  May repeat in 2 hours if no improvement x 1 dose. What changed:    how much to take  how to take this  when to take this  reasons to take this      Allergies    Allergen Reactions  . Ciprofloxacin     REACTION: vomiting  . Codeine     REACTION: vomiting  . Sulfonamide Derivatives     hives   Discharge Instructions    Diet - low sodium heart healthy   Complete by:  As directed    Discharge instructions   Complete by:  As directed    It is important that you read following instructions as well as go over your medication list with RN to help you understand your care after this hospitalization.  Discharge Instructions: Please follow-up with PCP in one week  Please request your primary care physician to go over all Hospital Tests and Procedure/Radiological results at the follow up,  Please get all Hospital records sent to your PCP by signing hospital release before you go home.   Do not take more than prescribed Pain, Sleep and Anxiety Medications. You were cared for by a hospitalist during your hospital stay. If  you have any questions about your discharge medications or the care you received while you were in the hospital after you are discharged, you can call the unit you were admitted to and ask to speak with the hospitalist on call if the hospitalist that took care of you is not available.  Once you are discharged, your primary care physician will handle any further medical issues. Please note that NO REFILLS for any discharge medications will be authorized once you are discharged, as it is imperative that you return to your primary care physician (or establish a relationship with a primary care physician if you do not have one) for your aftercare needs so that they can reassess your need for medications and monitor your lab values. You Must read complete instructions/literature along with all the possible adverse reactions/side effects for all the Medicines you take and that have been prescribed to you. Take any new Medicines after you have completely understood and accept all the possible adverse reactions/side effects. Wear Seat belts while  driving. If you have smoked or chewed Tobacco in the last 2 yrs please stop smoking and/or stop any Recreational drug use.   Increase activity slowly   Complete by:  As directed      Discharge Exam: Filed Weights   03/17/18 1149  Weight: 104.3 kg   Vitals:   03/18/18 0307 03/18/18 0700  BP: 124/85   Pulse: 76 74  Resp: 12 20  Temp: 98.5 F (36.9 C) 98.7 F (37.1 C)  SpO2: 95% 97%   General: Appear in no distress, no Rash; Oral Mucosa moist. Cardiovascular: S1 and S2 Present, no Murmur, no JVD Respiratory: Bilateral Air entry present and Clear to Auscultation, no Crackles, no wheezes Abdomen: Bowel Sound present, Soft and no tenderness Extremities: no Pedal edema, no calf tenderness Neurology: Grossly no focal neuro deficit.  The results of significant diagnostics from this hospitalization (including imaging, microbiology, ancillary and laboratory) are listed below for reference.    Significant Diagnostic Studies: Ct Angio Chest Pe W And/or Wo Contrast  Result Date: 03/17/2018 CLINICAL DATA:  Chest pain and shortness of breath. History of pulmonary emboli. EXAM: CT ANGIOGRAPHY CHEST WITH CONTRAST TECHNIQUE: Multidetector CT imaging of the chest was performed using the standard protocol during bolus administration of intravenous contrast. Multiplanar CT image reconstructions and MIPs were obtained to evaluate the vascular anatomy. CONTRAST:  ISOVUE-370 IOPAMIDOL (ISOVUE-370) INJECTION 76% COMPARISON:  Report of CT scan dated 12/07/2008 FINDINGS: Cardiovascular: There multiple bilateral pulmonary emboli involving all lobes of both lungs. RV LV ratio is 1.0. Heart size is normal. No pericardial effusion. Mediastinum/Nodes: No enlarged mediastinal, hilar, or axillary lymph nodes. Thyroid gland, trachea, and esophagus demonstrate no significant findings. Lungs/Pleura: Lungs are clear. No pleural effusion or pneumothorax. Upper Abdomen: Normal. Musculoskeletal: No chest wall  abnormality. No acute or significant osseous findings. Review of the MIP images confirms the above findings. IMPRESSION: Extensive bilateral pulmonary emboli. Slightly elevated RV/LV ratio. Critical Value/emergent results were called by telephone at the time of interpretation on 03/17/2018 at 1:31 pm to Dr. Lynden Oxford , who verbally acknowledged these results. Electronically Signed   By: Francene Boyers M.D.   On: 03/17/2018 13:31   US Venous Img Lower Bilateral  Result Date: 03/17/2018 CLINICAL DATA:  Chest pain EXAM: BILATERAL LOWER EXTREMITY VENOUS DOPPLER ULTRASOUND TECHNIQUE: Gray-scale sonography with graded compression, as well as color Doppler and duplex ultrasound were performed to evaluate the lower extremity deep venous systems from the level of the  common femoral vein and including the common femoral, femoral, profunda femoral, popliteal and calf veins including the posterior tibial, peroneal and gastrocnemius veins when visible. The superficial great saphenous vein was also interrogated. Spectral Doppler was utilized to evaluate flow at rest and with distal augmentation maneuvers in the common femoral, femoral and popliteal veins. COMPARISON:  None. FINDINGS: RIGHT LOWER EXTREMITY Common Femoral Vein: No evidence of thrombus. Normal compressibility, respiratory phasicity and response to augmentation. Saphenofemoral Junction: No evidence of thrombus. Normal compressibility and flow on color Doppler imaging. Profunda Femoral Vein: No evidence of thrombus. Normal compressibility and flow on color Doppler imaging. Femoral Vein: No evidence of thrombus. Normal compressibility, respiratory phasicity and response to augmentation. Popliteal Vein: No evidence of thrombus. Normal compressibility, respiratory phasicity and response to augmentation. Calf Veins: Thrombus and occlusion noted of 1 of the paired gastrocnemius veins in the right calf. Superficial Great Saphenous Vein: No evidence of thrombus.  Normal compressibility. Venous Reflux:  None. Other Findings:  None. LEFT LOWER EXTREMITY Common Femoral Vein: No evidence of thrombus. Normal compressibility, respiratory phasicity and response to augmentation. Saphenofemoral Junction: No evidence of thrombus. Normal compressibility and flow on color Doppler imaging. Profunda Femoral Vein: No evidence of thrombus. Normal compressibility and flow on color Doppler imaging. Femoral Vein: No evidence of thrombus. Normal compressibility, respiratory phasicity and response to augmentation. Popliteal Vein: No evidence of thrombus. Normal compressibility, respiratory phasicity and response to augmentation. Calf Veins: No evidence of thrombus. Normal compressibility and flow on color Doppler imaging. Superficial Great Saphenous Vein: No evidence of thrombus. Normal compressibility. Venous Reflux:  None. Other Findings:  None. IMPRESSION: Thrombosis of 1 of the paired right calf gastrocnemius veins. No DVT noted on the left. Electronically Signed   By: Charlett Nose M.D.   On: 03/17/2018 14:05    Microbiology: Recent Results (from the past 240 hour(s))  MRSA PCR Screening     Status: None   Collection Time: 03/17/18  7:36 PM  Result Value Ref Range Status   MRSA by PCR NEGATIVE NEGATIVE Final    Comment:        The GeneXpert MRSA Assay (FDA approved for NASAL specimens only), is one component of a comprehensive MRSA colonization surveillance program. It is not intended to diagnose MRSA infection nor to guide or monitor treatment for MRSA infections. Performed at [facility], [address] [place], [place] [address]      Labs: CBC: Recent Labs  Lab 03/17/18 1203 03/18/18 0347  WBC 6.5 5.0  NEUTROABS 3.7  --   HGB 15.2* 13.2  HCT 43.1 38.1  MCV 89.8 88.6  PLT 176 141*   Basic Metabolic Panel: Recent Labs  Lab 03/17/18 1203 03/18/18 0347  NA 137 142  K 3.8 3.7  CL 107 111  CO2 21* 20*  GLUCOSE 93 98  BUN 11 9  CREATININE  0.67 0.85  CALCIUM 9.1 8.5*  MG  --  1.9  PHOS  --  4.5   Liver Function Tests: Recent Labs  Lab 03/17/18 1203 03/18/18 0347  AST 20 18  ALT 17 16  ALKPHOS 49 43  BILITOT 0.9 1.1  PROT 7.7 6.3*  ALBUMIN 4.8 3.7   Recent Labs  Lab 03/17/18 1203  LIPASE 61*   No results for input(s): AMMONIA in the last 168 hours. Cardiac Enzymes: Recent Labs  Lab 03/17/18 1203 03/17/18 2141 03/18/18 0347 03/18/18 0732  TROPONINI <0.03 <0.03 <0.03 <0.03   BNP (last 3 results) Recent Labs  03/17/18 2141  BNP 11.5   CBG: No results for input(s): GLUCAP in the last 168 hours. Time spent: 35 minutes  Signed:  Lynden Oxford  [facility] 03/18/2018  , 2:09 PM

## 2018-03-24 ENCOUNTER — Telehealth: Payer: Self-pay

## 2018-03-24 NOTE — Telephone Encounter (Signed)
03/24/18   Transition Care Management Follow-up Telephone Call  ADMISSION DATE: 03/17/18  DISCHARGE DATE: 03/27/18    How have you been since you were released from the hospital?  Feeling good.  Do you understand why you were in the hospital? Yes   Do you understand the discharge instrcutions? Yes    Items Reviewed:  Medications reviewed: Yes   Allergies reviewed: Yes   Dietary changes reviewed: Low salt Heart healthy  Referrals reviewed: Yes  Functional Questionnaire:  Activities of Daily Living (ADLs): Patient can perform all independently.  Any patient concerns? None at this time.   Confirmed importance and date/time of follow-up visits scheduled: Yes   Confirmed with patient if condition begins to worsen call PCP or go to the ER. Yes    Patient was given the office number and encouragred to call back with questions or concerns. Yes

## 2018-03-25 ENCOUNTER — Ambulatory Visit (INDEPENDENT_AMBULATORY_CARE_PROVIDER_SITE_OTHER): Payer: 59 | Admitting: Internal Medicine

## 2018-03-25 ENCOUNTER — Encounter: Payer: Self-pay | Admitting: Internal Medicine

## 2018-03-25 VITALS — BP 126/80 | HR 74 | Temp 98.0°F | Resp 16 | Ht 63.0 in | Wt 234.2 lb

## 2018-03-25 DIAGNOSIS — F329 Major depressive disorder, single episode, unspecified: Secondary | ICD-10-CM

## 2018-03-25 DIAGNOSIS — F419 Anxiety disorder, unspecified: Secondary | ICD-10-CM

## 2018-03-25 DIAGNOSIS — I2699 Other pulmonary embolism without acute cor pulmonale: Secondary | ICD-10-CM

## 2018-03-25 DIAGNOSIS — I82409 Acute embolism and thrombosis of unspecified deep veins of unspecified lower extremity: Secondary | ICD-10-CM | POA: Diagnosis not present

## 2018-03-25 DIAGNOSIS — D6859 Other primary thrombophilia: Secondary | ICD-10-CM

## 2018-03-25 DIAGNOSIS — F32A Depression, unspecified: Secondary | ICD-10-CM

## 2018-03-25 MED ORDER — ALPRAZOLAM 0.5 MG PO TABS: 0.5000 mg | ORAL_TABLET | Freq: Two times a day (BID) | ORAL | 1 refills | Status: DC | PRN

## 2018-03-25 MED ORDER — SERTRALINE HCL 100 MG PO TABS: 50.0000 mg | ORAL_TABLET | Freq: Every day | ORAL | 2 refills | Status: DC

## 2018-03-25 MED ORDER — RIVAROXABAN 20 MG PO TABS: 20.0000 mg | ORAL_TABLET | Freq: Every day | ORAL | 1 refills | Status: DC

## 2018-03-25 MED ORDER — PROMETHAZINE HCL 25 MG PO TABS: 25.0000 mg | ORAL_TABLET | Freq: Four times a day (QID) | ORAL | 1 refills | Status: DC | PRN

## 2018-03-25 NOTE — Progress Notes (Signed)
Pre visit review using our clinic review tool, if applicable. No additional management support is needed unless otherwise documented below in the visit note. 

## 2018-03-25 NOTE — Assessment & Plan Note (Addendum)
Hospital follow-up PE, DVT: The patient had another PE and DVT in the context of poor compliance with Xarelto due to insurance issues. She will finish her starting package of Xarelto in approximately 22 days. After that, will stay on Xarelto 20 mg daily.  Prescriptions and samples were provided.  She is very aware of the importance of ongoing anticoagulation. Hyperlipidemia: Last LDL 123 Anxiety, depression: Refill Xanax, Zoloft.  Contract today. UDS on RTC  Due to insurance issues, she may not be able to continue coming to this office, encouraged to get a PCP soon if that is the case otherwise RTC 3 months.

## 2018-03-25 NOTE — Patient Instructions (Addendum)
GO TO THE FRONT DESK Schedule your next appointment for a physical exam in 3 months.  Fasting  After you finish the starting pack of Xarelto, take Xarelto 20 mg daily   Call or ER if: Chest pain, increased shortness of breath, calf pain or swelling.  Avoid prolonged trips or airplane trips for the next few weeks.

## 2018-03-25 NOTE — Progress Notes (Signed)
Subjective:    Patient ID: Leslie Rice, female    DOB: 14-Nov-1965, 52 y.o.   MRN: [id number]  DOS:  03/25/2018 Type of visit - description : TCM 7  Patient was admitted to the hospital and discharged March 18, 2018. She presented with 1 week history of difficulty breathing, chest pain, lightheadedness.  She also had a recent airplane trip. She has a history of hypercoagulable state, noncompliant with Xarelto. CT showed an  acute pulmonary emboli bilaterally, mild right heart strain. Serial troponins were negative, ultrasound showed a DVT at the right calf..   Review of Systems Since she left the hospital is taking Xarelto starting pack as prescribed. Shortly after the hospital admission she had a classic migraine with headache, nausea.  That is largely resolved. Denies chest pain, still slightly short of breath. No diarrhea.  No blood in the stool or in the urine. No dysuria or gross hematuria. Did report problems with her vision, mostly in the morning, is a slightly blurred but that resolves as the day goes by.  Already checked by optometrist and eye check was wnl   Past Medical History:  Diagnosis Date  . ALLERGIC RHINITIS   . Chronic pain syndrome    thoracic back pain, seeing Dr. Ethelene Halamos  . DEPRESSION   . DVT (deep venous thrombosis) (HCC)   . ELEVATED BLOOD PRESSURE   . GERD   . HYPERLIPIDEMIA   . INSOMNIA   . Irritable bowel syndrome   . MIGRAINE HEADACHE   . OSA (obstructive sleep apnea)    not on Cpap @ present 11-2013  . Primary hypercoagulable state (HCC)   . Pulmonary emboli [facility](HCC)     Past Surgical History:  Procedure Laterality Date  . c section     x 2   . CARPAL TUNNEL RELEASE     B  . NASAL SINUS SURGERY      Social History   Socioeconomic History  . Marital status: Single    Spouse name: Not on file  . Number of children: 2  . Years of education: Not on file  . Highest education level: Not on file  Occupational History  . 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 Financial resource strain: Not on file  . Food insecurity:    Worry: Not on file    Inability: Not on file  . Transportation needs:    Medical: Not on file    Non-medical: Not on file  Tobacco Use  . Smoking status: Never Smoker  . Smokeless tobacco: Never Used  Substance and Sexual Activity  . Alcohol use: Yes    Comment: occ  . Drug use: No  . Sexual activity: Not Currently    Birth control/protection: I.U.D.  Lifestyle  . Physical activity:    Days per week: Not on file    Minutes per session: Not on file  . Stress: Not on file  Relationships  . Social connections:    Talks on phone: Not on file    Gets together: Not on file    Attends religious service: Not on file    Active member of club or organization: Not on file    Attends meetings of clubs or organizations: Not on file    Relationship status: Not on file  . Intimate partner violence:    Fear of current or ex partner: Not on file    Emotionally abused: Not on file    Physically abused: Not  on file    Forced sexual activity: Not on file  Other Topics Concern  . Not on file  Social History Narrative   1 daughter live w/ her    One is in college       Allergies as of 03/25/2018      Reactions   Ciprofloxacin    REACTION: vomiting   Codeine    REACTION: vomiting   Sulfonamide Derivatives    hives      Medication List       Accurate as of March 25, 2018  5:14 PM. Always use your most recent med list.        AJOVY 225 MG/1.5ML Sosy Generic drug:  Fremanezumab-vfrm INJECT 1 PEN INTO THE SKIN EVERY 30 DAYS   ALPRAZolam 0.5 MG tablet Commonly known as:  XANAX Take 1 tablet (0.5 mg total) by mouth 2 (two) times daily as needed.   HYDROcodone-acetaminophen 10-325 MG tablet Commonly known as:  NORCO Take 1 tablet by mouth every 6 (six) hours as needed for moderate pain.   levonorgestrel 20 MCG/24HR IUD Commonly known as:  MIRENA 1 each by Intrauterine route  once.   promethazine 25 MG tablet Commonly known as:  PHENERGAN Take 1 tablet (25 mg total) by mouth every 6 (six) hours as needed for nausea or vomiting.   rivaroxaban 20 MG Tabs tablet Commonly known as:  XARELTO Take 1 tablet (20 mg total) by mouth daily with supper.   sertraline 100 MG tablet Commonly known as:  ZOLOFT Take 0.5 tablets (50 mg total) by mouth daily.   SUMAtriptan 50 MG tablet Commonly known as:  IMITREX One tablet by mouth at start of migraine.  May repeat in 2 hours if no improvement x 1 dose.           Objective:   Physical Exam BP 126/80 (BP Location: Left Arm, Patient Position: Sitting, Cuff Size: Normal)   Pulse 74   Temp 98 F (36.7 C) (Oral)   Resp 16   Ht 5\' 3"  (1.6 m)   Wt 234 lb 4 oz (106.3 kg)   SpO2 97%   BMI 41.50 kg/m  General:   Well developed, NAD, BMI noted. HEENT:  Normocephalic . Face symmetric, atraumatic Lungs:  CTA B Normal respiratory effort, no intercostal retractions, no accessory muscle use. Heart: RRR,  no murmur.  No pretibial edema bilaterally  Skin: Not pale. Not jaundice Neurologic:  alert & oriented X3.  Speech normal, gait appropriate for age and unassisted Psych--  Cognition and judgment appear intact.  Cooperative with normal attention span and concentration.  Behavior appropriate. No anxious or depressed appearing.      Assessment & Plan:     Assessment Primary hypercoagulable state, change Coumadin to Xarelto 05-2014 PE 2002 while on HRT, saw Dr Cyndie Chime ;+ FH, testing on her was (-);  rx coumadin since 2002 for life  superficial phlebitis 11-2014 Elevated BP Hyperlipidemia-- Pravachol, simvastatin caused aches  Morbidly obese Depression, insomnia: self dc lamictal ~ 08-2014 , on xanax rx by pcp Migraine headaches-- f/u by pcp  on imitrex per pcp Used to see Dr. Sandria Manly, S/P Topamax, BB IBS  OSA-- + test 201 (, [facility]),repeat sleep study + 2017, on CPAP as off 03-2016 Vit B12 and Vit D  def-- poor compliance w/ supplements Chronic pain syndrome ---hydrocodone Rx per Dr. Ethelene Hal Birth Control--- IUD  [facility] follow-up PE, DVT: The patient had another PE and DVT in the context of poor compliance  with Xarelto due to insurance issues. She will finish her starting package of Xarelto in approximately 22 days. After that, will stay on Xarelto 20 mg daily.  Prescriptions and samples were provided.  She is very aware of the importance of ongoing anticoagulation. Hyperlipidemia: Last LDL 123 Anxiety, depression: Refill Xanax, Zoloft.  Contract today. UDS on RTC  Due to insurance issues, she may not be able to continue coming to this office, encouraged to get a PCP soon if that is the case otherwise RTC 3 months.

## 2018-04-08 DIAGNOSIS — M5136 Other intervertebral disc degeneration, lumbar region: Secondary | ICD-10-CM | POA: Diagnosis not present

## 2018-05-12 LAB — HM PAP SMEAR

## 2018-05-27 ENCOUNTER — Other Ambulatory Visit: Payer: Self-pay | Admitting: Internal Medicine

## 2018-07-28 ENCOUNTER — Ambulatory Visit (INDEPENDENT_AMBULATORY_CARE_PROVIDER_SITE_OTHER): Payer: 59 | Admitting: Internal Medicine

## 2018-07-28 ENCOUNTER — Other Ambulatory Visit: Payer: Self-pay

## 2018-07-28 ENCOUNTER — Encounter: Payer: Self-pay | Admitting: Internal Medicine

## 2018-07-28 DIAGNOSIS — F32A Depression, unspecified: Secondary | ICD-10-CM

## 2018-07-28 DIAGNOSIS — F329 Major depressive disorder, single episode, unspecified: Secondary | ICD-10-CM

## 2018-07-28 DIAGNOSIS — D6859 Other primary thrombophilia: Secondary | ICD-10-CM

## 2018-07-28 NOTE — Progress Notes (Signed)
Subjective:    Patient ID: Leslie Rice, female    DOB: October 13, 1965, 53 y.o.   MRN: [id number]  DOS:  07/28/2018 Type of visit - description: Virtual Visit via Video Note  I connected with@ on 07/28/18 at  2:00 PM EDT by a video enabled telemedicine application and verified that I am speaking with the correct person using two identifiers.   THIS ENCOUNTER IS A VIRTUAL VISIT DUE TO COVID-19 - PATIENT WAS NOT SEEN IN THE OFFICE. PATIENT HAS CONSENTED TO VIRTUAL VISIT / TELEMEDICINE VISIT   Location of patient: home  Location of provider: office  I discussed the limitations of evaluation and management by telemedicine and the availability of in person appointments. The patient expressed understanding and agreed to proceed.  History of Present Illness: Follow-up Was seen few months ago shortly after she had a DVT and PE in the context of not taking Xarelto. Reports good compliance with medication. Also, stress level has increased recently due to the coronavirus situation that requires social distancing. She is currently on sertraline 50 mg and wonders if that could be changed.    Review of Systems Denies chest pain no difficulty breathing.  No lower extremity edema or palpitations No recent headaches. No suicidal ideas. No blood in the stool or in the urine  Past Medical History:  Diagnosis Date  . ALLERGIC RHINITIS   . Chronic pain syndrome    thoracic back pain, seeing Dr. Ethelene Hal  . DEPRESSION   . DVT (deep venous thrombosis) (HCC)   . ELEVATED BLOOD PRESSURE   . GERD   . HYPERLIPIDEMIA   . INSOMNIA   . Irritable bowel syndrome   . MIGRAINE HEADACHE   . OSA (obstructive sleep apnea)    not on Cpap @ present 11-2013  . Primary hypercoagulable state (HCC)   . Pulmonary emboli [facility])     Past Surgical History:  Procedure Laterality Date  . c section     x 2   . CARPAL TUNNEL RELEASE     B  . NASAL SINUS SURGERY      Social History   Socioeconomic History  .  Marital status: Single    Spouse name: Not on file  . Number of children: 2  . Years of education: Not on file  . Highest education level: Not on file  Occupational History  . 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 Financial resource strain: Not on file  . Food insecurity:    Worry: Not on file    Inability: Not on file  . Transportation needs:    Medical: Not on file    Non-medical: Not on file  Tobacco Use  . Smoking status: Never Smoker  . Smokeless tobacco: Never Used  Substance and Sexual Activity  . Alcohol use: Yes    Comment: occ  . Drug use: No  . Sexual activity: Not Currently    Birth control/protection: I.U.D.  Lifestyle  . Physical activity:    Days per week: Not on file    Minutes per session: Not on file  . Stress: Not on file  Relationships  . Social connections:    Talks on phone: Not on file    Gets together: Not on file    Attends religious service: Not on file    Active member of club or organization: Not on file    Attends meetings of clubs or organizations: Not on file    Relationship status: Not on  file  . Intimate partner violence:    Fear of current or ex partner: Not on file    Emotionally abused: Not on file    Physically abused: Not on file    Forced sexual activity: Not on file  Other Topics Concern  . Not on file  Social History Narrative   1 daughter live w/ her    One is in college       Allergies as of 07/28/2018      Reactions   Ciprofloxacin    REACTION: vomiting   Codeine    REACTION: vomiting   Sulfonamide Derivatives    hives      Medication List       Accurate as of July 28, 2018  1:59 PM. Always use your most recent med list.        Ajovy 225 MG/1.5ML Sosy Generic drug:  Fremanezumab-vfrm INJECT 1 PEN INTO THE SKIN EVERY 30 DAYS   ALPRAZolam 0.5 MG tablet Commonly known as:  XANAX Take 1 tablet (0.5 mg total) by mouth 2 (two) times daily as needed.   HYDROcodone-acetaminophen 10-325 MG tablet  Commonly known as:  NORCO Take 1 tablet by mouth every 6 (six) hours as needed for moderate pain.   levonorgestrel 20 MCG/24HR IUD Commonly known as:  MIRENA 1 each by Intrauterine route once.   promethazine 25 MG tablet Commonly known as:  PHENERGAN Take 1 tablet (25 mg total) by mouth every 6 (six) hours as needed for nausea or vomiting.   rivaroxaban 20 MG Tabs tablet Commonly known as:  Xarelto Take 1 tablet (20 mg total) by mouth daily with supper.   sertraline 100 MG tablet Commonly known as:  ZOLOFT Take 0.5 tablets (50 mg total) by mouth daily.   SUMAtriptan 50 MG tablet Commonly known as:  IMITREX One tablet by mouth at start of migraine.  May repeat in 2 hours if no improvement x 1 dose.           Objective:   Physical Exam There were no vitals taken for this visit. This is filtral video visit, alert oriented x3, in no physical or emotional distress.     Assessment     Assessment Primary hypercoagulable state, change Coumadin to Xarelto 05-2014 PE 2002 while on HRT, saw Dr Cyndie Chime ;+ FH, testing on her was (-);  rx coumadin since 2002 for life  superficial phlebitis 11-2014 Elevated BP Hyperlipidemia-- Pravachol, simvastatin caused aches  Morbidly obese Depression, insomnia: self dc lamictal ~ 08-2014 , on xanax rx by pcp Migraine headaches-- f/u by pcp  on imitrex per pcp Used to see Dr. Sandria Manly, S/P Topamax, BB IBS  OSA-- + test 201 (, [facility]),repeat sleep study + 2017, on CPAP as off 03-2016 Vit B12 and Vit D def-- poor compliance w/ supplements Chronic pain syndrome ---hydrocodone Rx per Dr. Ethelene Hal Birth Control---none as off 07/2018  PLAN Hypercoagulable state See last visit, was seen for a hospital follow-up  (PE DVT), currently asymptomatic, good compliance with Xarelto, RF when needed. Depression, insomnia: Increasing stress lately due to COVID-19 pandemia, patient is counseled, currently on sertraline 50 mg wonders if she could switch to  another or increase the dose.  Previously, a higher dose of sertraline made her dull but she is willing to try. She has Xanax,  has been taking it a little more often lately. Plan: Increase sertraline to 100 mg daily, we agreed on that, she will call in 4 weeks and let me know  how she is doing.  Consider change to Prozac and Cymbalta which has less potential to make her feel "dull". Migraines: Not taking Ajovy ($$), headaches are not frequent. Others:  IUD was removed RTC 3 to 4 months CPX

## 2018-07-28 NOTE — Assessment & Plan Note (Signed)
Hypercoagulable state See last visit, was seen for a hospital follow-up  (PE DVT), currently asymptomatic, good compliance with Xarelto, RF when needed. Depression, insomnia: Increasing stress lately due to COVID-19 pandemia, patient is counseled, currently on sertraline 50 mg wonders if she could switch to another or increase the dose.  Previously, a higher dose of sertraline made her dull but she is willing to try. She has Xanax,  has been taking it a little more often lately. Plan: Increase sertraline to 100 mg daily, we agreed on that, she will call in 4 weeks and let me know how she is doing.  Consider change to Prozac and Cymbalta which has less potential to make her feel "dull". Migraines: Not taking Ajovy ($$), headaches are not frequent. Others:  IUD was removed RTC 3 to 4 months CPX

## 2018-08-18 ENCOUNTER — Telehealth: Payer: Self-pay | Admitting: Internal Medicine

## 2018-08-18 ENCOUNTER — Other Ambulatory Visit: Payer: Self-pay | Admitting: Internal Medicine

## 2018-08-18 DIAGNOSIS — F32A Depression, unspecified: Secondary | ICD-10-CM

## 2018-08-18 DIAGNOSIS — F329 Major depressive disorder, single episode, unspecified: Secondary | ICD-10-CM

## 2018-08-18 MED ORDER — ALPRAZOLAM 0.5 MG PO TABS: 0.5000 mg | ORAL_TABLET | Freq: Two times a day (BID) | ORAL | 1 refills | Status: DC | PRN

## 2018-08-18 MED ORDER — SERTRALINE HCL 100 MG PO TABS: 100.0000 mg | ORAL_TABLET | Freq: Every day | ORAL | 1 refills | Status: DC

## 2018-08-18 NOTE — Telephone Encounter (Signed)
Alprazolam refill.   Last OV: 07/28/2018 Last Fill; 03/25/2018 #60 and 1RF UDS: 09/14/2016 Low risk

## 2018-08-18 NOTE — Telephone Encounter (Signed)
Sent!

## 2018-08-18 NOTE — Telephone Encounter (Signed)
Copied from CRM [id number]. Topic: Quick Communication - Rx Refill/Question >> Aug 18, 2018 10:36 AM Angela Nevin wrote: Medication: ALPRAZolam Prudy Feeler) 0.5 MG tablet  Patient is requesting refill of this medication.   Preferred Pharmacy (with phone number or street name): [id number] - [place], [place] - [address] AT [facility] [address] [facility] [address] [phone] (Phone) [phone] (Fax)

## 2018-08-18 NOTE — Telephone Encounter (Signed)
Rx sent 

## 2018-08-18 NOTE — Telephone Encounter (Signed)
Copied from CRM [id number]. Topic: Quick Communication - Rx Refill/Question >> Aug 18, 2018  1:28 PM Mcneil, Ja-Kwan wrote: Medication: sertraline (ZOLOFT) 100 MG tablet  Has the patient contacted their pharmacy? yes   Preferred Pharmacy (with phone number or street name): [id number] - [place], [place] - [address] AT [facility] OF [address] [phone] (Phone)  [phone] (Fax)  Agent: Please be advised that RX refills may take up to 3 business days. We ask that you follow-up with your pharmacy.

## 2018-08-28 ENCOUNTER — Encounter: Payer: Self-pay | Admitting: Internal Medicine

## 2018-08-28 ENCOUNTER — Telehealth: Payer: Self-pay | Admitting: Internal Medicine

## 2018-08-28 NOTE — Telephone Encounter (Signed)
See patient's message, she is having headache for about a week, very different to her migraines, located at the top of the head, no associated with aura, sinus symptoms, fever or chills. She had to skip work 1 time last week as she was not feeling well. The patient is Xarelto, I strongly recommend her to go to the ER to be further eval tonight. She is quite reluctant, mostly for financial reasons, she seems to understand my concern of intracranial bleeding but nevertheless likes to wait. I asked her to update me on her situation tomorrow.

## 2018-11-28 ENCOUNTER — Ambulatory Visit: Payer: Self-pay | Admitting: *Deleted

## 2018-11-28 NOTE — Telephone Encounter (Signed)
Pt going to [facility].

## 2018-11-28 NOTE — Telephone Encounter (Signed)
Pt called stating that her ankles and feet have been swollen for a week; she has a headache; her headache started 11/27/2018 around 2230; took took ibuprofen and applied ice to her head (pain rated 9 initially but went to 4 out of 10); her headache continues and she has taken ibuprofen last night and also today (600 mg at 0700, and 1400); elevating her feet has not helped the swelling in her ankles; she says that it has been worsening over the past week; she says her skin is "tight", and she can not wear her shoes due to swelling; the pt has not checked her BP; she says that she has reduced her salt intake and elevation of her feet has not helped; the pt says that she has a history of PE, but she does not feel like that; recommendations made per nurse triage protocol; the pt verbalizes understanding but she does not want to the ED; she sees Dr Larose Kells, [facility]; pt transferred to [facility], and per Dr Larose Kells the pt should go to the ED; will route to office for notification.  Reason for Disposition . [1] Difficulty breathing with exertion (e.g., walking) AND [2] new onset or worsening  Answer Assessment - Initial Assessment Questions 1. ONSET: "When did the swelling start?" (e.g., minutes, hours, days)     11/20/2018 2. LOCATION: "What part of the leg is swollen?"  "Are both legs swollen or just one leg?"     Both ankles and feet 3. SEVERITY: "How bad is the swelling?" (e.g., localized; mild, moderate, severe)  - Localized - small area of swelling localized to one leg  - MILD pedal edema - swelling limited to foot and ankle, pitting edema < 1/4 inch (6 mm) deep, rest and elevation eliminate most or all swelling  - MODERATE edema - swelling of lower leg to knee, pitting edema > 1/4 inch (6 mm) deep, rest and elevation only partially reduce swelling  - SEVERE edema - swelling extends above knee, facial or hand swelling present      moderate 4. REDNESS: "Does the swelling look red or infected?"     no 5.  PAIN: "Is the swelling painful to touch?" If so, ask: "How painful is it?"   (Scale 1-10; mild, moderate or severe)     Discomfort rated 3 out of 10 6. FEVER: "Do you have a fever?" If so, ask: "What is it, how was it measured, and when did it start?"      No temp 98.4  7. CAUSE: "What do you think is causing the leg swelling?"    "Major water retention" 8. MEDICAL HISTORY: "Do you have a history of heart failure, kidney disease, liver failure, or cancer?"     History of blood clots; takes xarelto 9. RECURRENT SYMPTOM: "Have you had leg swelling before?" If so, ask: "; happenWhen was the last time?" "What happened that time?"     Yes, when she was pregnant 10. OTHER SYMPTOMS: "Do you have any other symptoms?" (e.g., chest pain, difficulty breathing)       Shortness of breath with exertion 11. PREGNANCY: "Is there any chance you are pregnant?" "When was your last menstrual period?"       no  Protocols used: LEG SWELLING AND EDEMA-A-AH

## 2018-12-03 ENCOUNTER — Ambulatory Visit: Payer: 59 | Admitting: Internal Medicine

## 2018-12-09 ENCOUNTER — Ambulatory Visit (INDEPENDENT_AMBULATORY_CARE_PROVIDER_SITE_OTHER): Payer: Managed Care, Other (non HMO) | Admitting: Internal Medicine

## 2018-12-09 ENCOUNTER — Other Ambulatory Visit: Payer: Self-pay

## 2018-12-09 ENCOUNTER — Encounter: Payer: Self-pay | Admitting: Internal Medicine

## 2018-12-09 VITALS — BP 149/92 | HR 81 | Temp 97.7°F | Resp 16 | Ht 63.0 in | Wt 235.2 lb

## 2018-12-09 DIAGNOSIS — G4733 Obstructive sleep apnea (adult) (pediatric): Secondary | ICD-10-CM

## 2018-12-09 DIAGNOSIS — Z79899 Other long term (current) drug therapy: Secondary | ICD-10-CM

## 2018-12-09 DIAGNOSIS — R079 Chest pain, unspecified: Secondary | ICD-10-CM

## 2018-12-09 DIAGNOSIS — E785 Hyperlipidemia, unspecified: Secondary | ICD-10-CM | POA: Diagnosis not present

## 2018-12-09 DIAGNOSIS — F329 Major depressive disorder, single episode, unspecified: Secondary | ICD-10-CM | POA: Diagnosis not present

## 2018-12-09 DIAGNOSIS — Z23 Encounter for immunization: Secondary | ICD-10-CM | POA: Diagnosis not present

## 2018-12-09 DIAGNOSIS — Z0000 Encounter for general adult medical examination without abnormal findings: Secondary | ICD-10-CM

## 2018-12-09 DIAGNOSIS — F32A Depression, unspecified: Secondary | ICD-10-CM

## 2018-12-09 DIAGNOSIS — Z0001 Encounter for general adult medical examination with abnormal findings: Secondary | ICD-10-CM | POA: Diagnosis not present

## 2018-12-09 LAB — COMPREHENSIVE METABOLIC PANEL
ALT: 8 U/L (ref 0–35)
AST: 14 U/L (ref 0–37)
Albumin: 4.2 g/dL (ref 3.5–5.2)
Alkaline Phosphatase: 68 U/L (ref 39–117)
BUN: 7 mg/dL (ref 6–23)
CO2: 25 mEq/L (ref 19–32)
Calcium: 8.8 mg/dL (ref 8.4–10.5)
Chloride: 108 mEq/L (ref 96–112)
Creatinine, Ser: 0.71 mg/dL (ref 0.40–1.20)
GFR: 86.83 mL/min (ref >60.00)
Glucose, Bld: 90 mg/dL (ref 70–99)
Potassium: 4.2 mEq/L (ref 3.5–5.1)
Sodium: 142 mEq/L (ref 135–145)
Total Bilirubin: 0.5 mg/dL (ref 0.2–1.2)
Total Protein: 6.7 g/dL (ref 6.0–8.3)

## 2018-12-09 LAB — CBC WITH DIFFERENTIAL/PLATELET
Basophils Absolute: 0 10*3/uL (ref 0.0–0.1)
Basophils Relative: 0.5 % (ref 0.0–3.0)
Eosinophils Absolute: 0.1 10*3/uL (ref 0.0–0.7)
Eosinophils Relative: 1.7 % (ref 0.0–5.0)
HCT: 29.4 % — ABNORMAL LOW (ref 36.0–46.0)
Hemoglobin: 9.1 g/dL — ABNORMAL LOW (ref 12.0–15.0)
Lymphocytes Relative: 26.1 % (ref 12.0–46.0)
Lymphs Abs: 1.4 10*3/uL (ref 0.7–4.0)
MCHC: 30.8 g/dL (ref 30.0–36.0)
MCV: 64.8 fl — ABNORMAL LOW (ref 78.0–100.0)
Monocytes Absolute: 0.3 10*3/uL (ref 0.1–1.0)
Monocytes Relative: 5 % (ref 3.0–12.0)
Neutro Abs: 3.6 10*3/uL (ref 1.4–7.7)
Neutrophils Relative %: 66.7 % (ref 43.0–77.0)
Platelets: 230 10*3/uL (ref 150.0–400.0)
RBC: 4.54 Mil/uL (ref 3.87–5.11)
RDW: 18.8 % — ABNORMAL HIGH (ref 11.5–15.5)
WBC: 5.5 10*3/uL (ref 4.0–10.5)

## 2018-12-09 LAB — LIPID PANEL
Cholesterol: 207 mg/dL — ABNORMAL HIGH (ref 0–200)
HDL: 36.8 mg/dL — ABNORMAL LOW (ref >39.00)
NonHDL: 169.8
Total CHOL/HDL Ratio: 6
Triglycerides: 342 mg/dL — ABNORMAL HIGH (ref 0.0–149.0)
VLDL: 68.4 mg/dL — ABNORMAL HIGH (ref 0.0–40.0)

## 2018-12-09 LAB — LDL CHOLESTEROL, DIRECT: Direct LDL: 90 mg/dL

## 2018-12-09 LAB — TSH: TSH: 1.07 u[IU]/mL (ref 0.35–4.50)

## 2018-12-09 MED ORDER — METOPROLOL TARTRATE 25 MG PO TABS: 25.0000 mg | ORAL_TABLET | Freq: Two times a day (BID) | ORAL | 3 refills | Status: DC

## 2018-12-09 NOTE — Progress Notes (Signed)
Pre visit review using our clinic review tool, if applicable. No additional management support is needed unless otherwise documented below in the visit note. 

## 2018-12-09 NOTE — Assessment & Plan Note (Addendum)
Here for CPX Elevated BP: BP today is elevated, metoprolol 25 mg twice daily.  Recommend ambulatory BPs however patient does not have a cuff Hyperlipidemia: On no medications, last LDL 123, CV RF 10 years 3.2%.  Recheck a FLP Hypercoagulable state: On Xarelto Depression anxiety insomnia: Currently on Xanax, UDS today, self discontinued sertraline, "it was not doing what he was supposed to".  She remains stressed, frustrated, anxious and depressed.  Will refer back to psychiatry rather than try other meds, at some point Lamictal was very helpful.   Also recommend counseling. OSA: Not using a CPAP.  "When I used it regularly I did not feel different".  Advised that not using CPAP is really deleterious for her physical and mental wellbeing. B12, vitamin D deficiency: Poor compliance, recommend B12 supplements daily and vitamin D3 2000 units daily.  Labs on RTC. Morbid obesity: Counseled about diet and exercise Chest pain: Exertional, EKG today: Sinus rhythm, no acute changes.  Refer to cardiology, ER if symptoms severe. RTC 3 months

## 2018-12-09 NOTE — Progress Notes (Signed)
Subjective:    Patient ID: Leslie Rice, female    DOB: 1966/02/27, 53 y.o.   MRN: [id number]  DOS:  12/09/2018 Type of visit - description: CPX Multiple other issues discussed Depression anxiety: Since last visit sertraline dose increased, "it was not doing what he was supposed to".  She self stopped. Currently feels irritable, "short fuse", cries easily. Denies suicidal ideas  Also reports chest pain: For the last 4 weeks, typically after walking 2 blocks has left-sided chest pain and right arm numbness.  No associated nausea, clamminess, cough. Symptoms decreased with rest.  OSA, vitamin deficiency: Poor compliance with supplements and CPAP.    Review of Systems Denies fever chills Admits to lower extremity edema around the ankles, in a symmetric fashion.  Other than above, a 14 point review of systems is negative     Past Medical History:  Diagnosis Date   ALLERGIC RHINITIS    Chronic pain syndrome    thoracic back pain, seeing Dr. Ethelene Hal   DEPRESSION    DVT (deep venous thrombosis) (HCC)    ELEVATED BLOOD PRESSURE    GERD    HYPERLIPIDEMIA    INSOMNIA    Irritable bowel syndrome    MIGRAINE HEADACHE    OSA (obstructive sleep apnea)    not on Cpap @ present 11-2013   Primary hypercoagulable state (HCC)    Pulmonary emboli (HCC)     Past Surgical History:  Procedure Laterality Date   c section     x 2    CARPAL TUNNEL RELEASE     B   NASAL SINUS SURGERY      Social History   Socioeconomic History   Marital status: Single    Spouse name: Not on file   Number of children: 2   Years of education: Not on file   Highest education level: Not on file  Occupational History   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  Lifestyle   Physical activity    Days per week: Not on file    Minutes per session: Not on file   Stress: Not on file  Relationships   Social connections    Talks on phone: Not on file    Gets together: Not on file    Attends religious service: Not on file    Active member of club or organization: Not on file    Attends meetings of clubs or organizations: Not on file    Relationship status: Not on file   Intimate partner violence    Fear of current or ex partner: Not on file    Emotionally abused: Not on file    Physically abused: Not on file    Forced sexual activity: Not on file  Other Topics Concern   Not on file  Social History Narrative   1 daughter live w/ her    One graduated from [employer], lives in [place]  Family History  Problem Relation Age of Onset   Colon cancer Other        M, aunt, nephew   CAD Father        age? "silent MIs"   Lung cancer Father    Diabetes Father        F, sisters x2   Thyroid cancer Sister    Colon cancer Sister        dx age 60   Breast cancer Neg Hx    Stroke Neg Hx      Allergies as of 12/09/2018      Reactions   Ciprofloxacin    REACTION: vomiting   Codeine    REACTION: vomiting   Sulfonamide Derivatives    hives      Medication List       Accurate as of December 09, 2018  5:25 PM. If you have any questions, ask your nurse or doctor.        STOP taking these medications   sertraline 100 MG tablet Commonly known as: ZOLOFT Stopped by: Kathlene November, MD     TAKE these medications   Ajovy 225 MG/1.5ML Sosy Generic drug: Fremanezumab-vfrm INJECT 1 PEN INTO THE SKIN EVERY 30 DAYS   ALPRAZolam 0.5 MG tablet Commonly known as: XANAX Take 1 tablet (0.5 mg total) by mouth 2 (two) times  daily as needed.   HYDROcodone-acetaminophen 10-325 MG tablet Commonly known as: NORCO Take 1 tablet by mouth every 6 (six) hours as needed for moderate pain.   metoprolol tartrate 25 MG tablet Commonly known as: LOPRESSOR Take 1 tablet (25 mg total) by mouth 2 (two) times daily. Started by: Kathlene November, MD   promethazine 25 MG tablet Commonly known as: PHENERGAN Take 1 tablet (25 mg total) by mouth every 6 (six) hours as needed for nausea or vomiting.   rivaroxaban 20 MG Tabs tablet Commonly known as: Xarelto Take 1 tablet (20 mg total) by mouth daily with supper.   SUMAtriptan 50 MG tablet Commonly known as: IMITREX One tablet by mouth at start of migraine.  May repeat in 2 hours if no improvement x 1 dose.           Objective:   Physical Exam BP (!) 149/92 (BP Location: Left Arm, Patient Position: Sitting, Cuff Size: Normal)    Pulse 81    Temp 97.7 F (36.5 C) (Temporal)    Resp 16    Ht 5\' 3"  (1.6 m)    Wt 235 lb 4 oz (106.7 kg)    SpO2 100%    BMI 41.67 kg/m  General: Well developed, NAD, BMI noted Neck: No  thyromegaly  HEENT:  Normocephalic . Face symmetric, atraumatic Lungs:  CTA B Normal respiratory effort, no intercostal retractions, no accessory muscle use. Heart: RRR,  no murmur.  No pretibial edema bilaterally  Abdomen:  Not distended, soft, non-tender. No rebound or rigidity.   Skin: Exposed areas without rash. Not pale. Not jaundice Neurologic:  alert & oriented X3.  Speech normal, gait appropriate for age and unassisted Strength symmetric and appropriate for age.  Psych: Cognition and judgment appear intact.  Cooperative with normal attention span and concentration.  Behavior appropriate. She seems anxious, frustrated.  Not tearful.     Assessment     Assessment Primary hypercoagulable state, change Coumadin to Xarelto 05-2014 -PE 2002 while on HRT, saw Dr Beryle Beams ;+ FH, testing on her was (-);  rx coumadin since 2002 for life   -superficial phlebitis  11-2014 - PE DVT 03/2018 (non compliant w/ xarelto Elevated BP Hyperlipidemia-- Pravachol, simvastatin caused aches  Morbidly obese Depression, anxiety,  insomnia:  since divorce 2007 Lamictal helped according to my notes however she self dc it ~ 08-2014  -on xanax rx by pcp Migraine headaches: sees neuro as of 12/2018 .on imitrex per pcp.  Used to take BB, Topamax IBS  OSA : + test before ([facility]),repeat sleep study + 2017,   Vit B12 and Vit D def-- poor compliance w/ supplements Chronic pain syndrome ---hydrocodone Rx per Dr. Ethelene Halamos Birth Control---none as off 07/2018  PLAN Here for CPX Elevated BP: BP today is elevated, metoprolol 25 mg twice daily.  Recommend ambulatory BPs however patient does not have a cuff Hyperlipidemia: On no medications, last LDL 123, CV RF 10 years 3.2%.  Recheck a FLP Hypercoagulable state: On Xarelto Depression anxiety insomnia: Currently on Xanax, UDS today, self discontinued sertraline, "it was not doing what he was supposed to".  She remains stressed, frustrated, anxious and depressed.  Will refer back to psychiatry rather than try other meds, at some point Lamictal was very helpful.   Also recommend counseling. OSA: Not using a CPAP.  "When I used it regularly I did not feel different".  Advised that not using CPAP is really deleterious for her physical and mental wellbeing. B12, vitamin D deficiency: Poor compliance, recommend B12 supplements daily and vitamin D3 2000 units daily.  Labs on RTC. Morbid obesity: Counseled about diet and exercise Chest pain: Exertional, EKG today: Sinus rhythm, no acute changes.  Refer to cardiology, ER if symptoms severe. RTC 3 months   Today, in addition to the physical exam, I spent more than  25  min with the patient: >50% of the time counseling regards her chronic medical problems as well as worsening depression anxiety, poor compliance with medication, and a new symptom (chest pain).

## 2018-12-09 NOTE — Patient Instructions (Addendum)
GO TO THE LAB : Get the blood work     GO TO THE FRONT DESK Schedule your next appointment   for a checkup in 3 months  Take vitamin D3: 2000 units daily Take a B12 supplement daily  Use your CPAP daily, contact neurology if needed   Call [facility] Address: [address], [place], [place] [address] Hours:  [place] soon ? 9AM Phone: [phone]    If severe or prolonged chest pain: Call 911

## 2018-12-09 NOTE — Assessment & Plan Note (Addendum)
-  Td: 2015, flu shot today -[facility]  +FH Sister age 51   dx w/ colon cancer  . Mother and aunt had colon ca age dx in their 3s Pt had a cscope at age 57, normal @ GI [facility] S/p  cscope #2 on  03-2015, normal per report, 5 years -female care:  saw  Gynecology 05/2018, had a MMG -Labs: CMP, CBC, TSH, FLP

## 2018-12-10 ENCOUNTER — Telehealth: Payer: Self-pay

## 2018-12-10 DIAGNOSIS — F32A Depression, unspecified: Secondary | ICD-10-CM

## 2018-12-10 DIAGNOSIS — F329 Major depressive disorder, single episode, unspecified: Secondary | ICD-10-CM

## 2018-12-10 MED ORDER — RIVAROXABAN 20 MG PO TABS: 20.0000 mg | ORAL_TABLET | Freq: Every day | ORAL | 1 refills | Status: DC

## 2018-12-10 MED ORDER — ALPRAZOLAM 0.5 MG PO TABS: 0.5000 mg | ORAL_TABLET | Freq: Two times a day (BID) | ORAL | 1 refills | Status: DC | PRN

## 2018-12-10 NOTE — Telephone Encounter (Signed)
Xarelto sent to [facility]- Pt requested alprazolam to be refilled yesterday- she is needing it to go to [facility] as well.

## 2018-12-10 NOTE — Telephone Encounter (Signed)
Copied from [place] [id number]. Topic: General - Other >> Dec 10, 2018 10:28 AM Keene Breath wrote: Reason for CRM:  Patient called to ask the nurse to call her regarding some questions she has about her prescriptions.  CB# [phone]  Patient requesting a 90 day refill of Xarelto at [facility] on [address] (insurance is now requesting for her to switch to [facility]) Also needs a refill for Alprazolam to [facility], she wanted provider to know she "tried to get appointment at [facility] but is going to be a few weeks before they can see her"

## 2018-12-10 NOTE — Telephone Encounter (Signed)
sent 

## 2018-12-11 ENCOUNTER — Telehealth: Payer: Self-pay | Admitting: Internal Medicine

## 2018-12-11 DIAGNOSIS — D649 Anemia, unspecified: Secondary | ICD-10-CM

## 2018-12-11 LAB — PAIN MGMT, PROFILE 8 W/CONF, U
6 Acetylmorphine: NEGATIVE ng/mL
Alcohol Metabolites: NEGATIVE ng/mL (ref <500)
Alphahydroxyalprazolam: 116 ng/mL
Alphahydroxymidazolam: NEGATIVE ng/mL
Alphahydroxytriazolam: NEGATIVE ng/mL
Aminoclonazepam: NEGATIVE ng/mL
Amphetamines: NEGATIVE ng/mL
Benzodiazepines: POSITIVE ng/mL
Buprenorphine, Urine: NEGATIVE ng/mL
Cocaine Metabolite: NEGATIVE ng/mL
Codeine: NEGATIVE ng/mL
Creatinine: 189.2 mg/dL
Hydrocodone: 2101 ng/mL
Hydromorphone: 1984 ng/mL
Hydroxyethylflurazepam: NEGATIVE ng/mL
Lorazepam: NEGATIVE ng/mL
MDMA: NEGATIVE ng/mL
Marijuana Metabolite: 20 ng/mL
Marijuana Metabolite: POSITIVE ng/mL
Morphine: NEGATIVE ng/mL
Nordiazepam: NEGATIVE ng/mL
Norhydrocodone: 4288 ng/mL
Opiates: POSITIVE ng/mL
Oxazepam: NEGATIVE ng/mL
Oxidant: NEGATIVE ug/mL
Oxycodone: NEGATIVE ng/mL
Temazepam: NEGATIVE ng/mL
pH: 5 (ref 4.5–9.0)

## 2018-12-11 MED ORDER — IRON 325 (65 FE) MG PO TABS: 1.0000 | ORAL_TABLET | Freq: Two times a day (BID) | ORAL | 3 refills | Status: DC

## 2018-12-11 NOTE — Telephone Encounter (Signed)
Iron 325mg  bid sent to [facility], GI referral placed to Dr. Marin Comment whom did her last cscope. Sherri- can you contact Pt and schedule visit for 2 months- thank you.

## 2018-12-11 NOTE — Telephone Encounter (Signed)
Labs are essentially okay except for anemia, bilirubin normal. The patient reports that on IUD was removed 04-2018, since then she is having very heavy.'s that last 5 to 6 days. She again denies any nausea, vomiting, abdominal pain or blood in the stools. Suspect anemia is due to DU B but GI etiology is also possible Plan: Patient will reach out to gynecology, reinsert IUD?  BCPs? Arrange a referral to GI, [facility]. Send iron 325 mg twice a day #60 and 3 refills Arrange a follow-up here in 2 months Keep appointment with cardiology although CP could be related to anemia. Do not stop Xarelto for now

## 2018-12-12 ENCOUNTER — Other Ambulatory Visit: Payer: Self-pay | Admitting: Internal Medicine

## 2018-12-17 ENCOUNTER — Other Ambulatory Visit: Payer: Self-pay

## 2018-12-17 ENCOUNTER — Ambulatory Visit (INDEPENDENT_AMBULATORY_CARE_PROVIDER_SITE_OTHER): Payer: Managed Care, Other (non HMO) | Admitting: Cardiology

## 2018-12-17 ENCOUNTER — Encounter: Payer: Self-pay | Admitting: Cardiology

## 2018-12-17 ENCOUNTER — Encounter: Payer: Self-pay | Admitting: *Deleted

## 2018-12-17 VITALS — BP 118/90 | HR 80 | Ht 63.0 in | Wt 236.0 lb

## 2018-12-17 DIAGNOSIS — R079 Chest pain, unspecified: Secondary | ICD-10-CM | POA: Diagnosis not present

## 2018-12-17 DIAGNOSIS — R0789 Other chest pain: Secondary | ICD-10-CM | POA: Diagnosis not present

## 2018-12-17 DIAGNOSIS — E785 Hyperlipidemia, unspecified: Secondary | ICD-10-CM

## 2018-12-17 DIAGNOSIS — G4733 Obstructive sleep apnea (adult) (pediatric): Secondary | ICD-10-CM

## 2018-12-17 DIAGNOSIS — R0602 Shortness of breath: Secondary | ICD-10-CM | POA: Diagnosis not present

## 2018-12-17 DIAGNOSIS — R9431 Abnormal electrocardiogram [ECG] [EKG]: Secondary | ICD-10-CM

## 2018-12-17 NOTE — Progress Notes (Signed)
Cardiology Office Note:    Date:  12/17/2018   ID:  Leslie Rice, DOB 02/26/1966, MRN [id number]  PCP:  Colon Branch, MD  Cardiologist:  Berniece Salines, DO  Electrophysiologist:  None   Referring MD: Colon Branch, MD   Chief Complaint  Patient presents with  . New Patient (Initial Visit)    Chest painwith exertiom,.with episode had SOB and Feet and ankles swelling.Medications reviewed verbally .    History of Present Illness:    Leslie Rice is a 53 y.o. female with a hx of hypertension, hyperlipidemia, OSA has cpap but has been using it recently due to some issues, history of recurrent PE/DVT now on Xarelto presents to be evaluated for chest pain.  She reports that over the last several months she has been experiencing left-sided dull pain on exertion.  She notes that whenever she goes out to walk her dog she gets this chest pain which lasts for about 10 to 15 minutes but most times resolves with rest.  She admits to associated shortness of breath, left-sided numbness and tingling but denies any lightheadedness or nausea.  She states that she had initially been contributing this to deconditioning since she works at home and has not been exercising as much but given the persistence and recurrence of the pain she decided to come get checked out.  She is not experiencing the pain today.  Past Medical History:  Diagnosis Date  . ALLERGIC RHINITIS   . Chronic pain syndrome    thoracic back pain, seeing Dr. Nelva Bush  . DEPRESSION   . DVT (deep venous thrombosis) ([place])   . ELEVATED BLOOD PRESSURE   . GERD   . HYPERLIPIDEMIA   . INSOMNIA   . Irritable bowel syndrome   . MIGRAINE HEADACHE   . OSA (obstructive sleep apnea)    not on Cpap @ present 11-2013  . Primary hypercoagulable state ([place])   . Pulmonary emboli [facility])     Past Surgical History:  Procedure Laterality Date  . c section     x 2   . CARPAL TUNNEL RELEASE     B  . NASAL SINUS SURGERY      Current Medications:  Current Meds  Medication Sig  . AJOVY 225 MG/1.5ML SOSY INJECT 1 PEN INTO THE SKIN EVERY 30 DAYS  . ALPRAZolam (XANAX) 0.5 MG tablet Take 1 tablet (0.5 mg total) by mouth 2 (two) times daily as needed.  . Ferrous Sulfate (IRON) 325 (65 Fe) MG TABS Take 1 tablet (325 mg total) by mouth 2 (two) times daily before a meal.  . HYDROcodone-acetaminophen (NORCO) 10-325 MG per tablet Take 1 tablet by mouth every 6 (six) hours as needed for moderate pain.   . metoprolol tartrate (LOPRESSOR) 25 MG tablet Take 1 tablet (25 mg total) by mouth 2 (two) times daily.  . promethazine (PHENERGAN) 25 MG tablet Take 1 tablet (25 mg total) by mouth every 6 (six) hours as needed for nausea or vomiting.  . rivaroxaban (XARELTO) 20 MG TABS tablet Take 1 tablet (20 mg total) by mouth daily with supper.  . SUMAtriptan (IMITREX) 50 MG tablet One tablet by mouth at start of migraine.  May repeat in 2 hours if no improvement x 1 dose.     Allergies:   Ciprofloxacin, Codeine, and Sulfonamide derivatives   Social History   Socioeconomic History  . Marital status: Single    Spouse name: Not on file  . Number of children: 2  .  Years of education: Not on file  . Highest education level: Not on file  Occupational History  . 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 Financial resource strain: Not on file  . Food insecurity    Worry: Not on file    Inability: Not on file  . Transportation needs    Medical: Not on file    Non-medical: Not on file  Tobacco Use  . Smoking status: Never Smoker  . Smokeless tobacco: Never Used  Substance and Sexual Activity  . Alcohol use: Yes    Comment: occ  . Drug use: No  . Sexual activity: Not Currently    Birth control/protection: I.U.D.  Lifestyle  . Physical activity    Days per week: Not on file    Minutes per session: Not on file  . Stress: Not on file  Relationships  . Social Musician on phone: Not on file    Gets together: Not on file    Attends  religious service: Not on file    Active member of club or organization: Not on file    Attends meetings of clubs or organizations: Not on file    Relationship status: Not on file  Other Topics Concern  . Not on file  Social History Narrative   1 daughter live w/ her    One graduated from [employer], lives in [place]         Family History: The patient's family history includes CAD in her father; Colon cancer in her sister and another family member; Diabetes in her father; Lung cancer in her father; Thyroid cancer in her sister. There is no history of Breast cancer or Stroke.  ROS:   Review of Systems  Constitutional: Negative for chills, fever and malaise/fatigue.  HENT: Negative for congestion, hearing loss and sore throat.   Eyes: Negative for blurred vision, discharge and redness.  Respiratory: Positive for shortness of breath. Negative for cough and wheezing.   Cardiovascular: Positive for chest pain. Negative for palpitations and leg swelling.  Gastrointestinal: Negative for constipation, heartburn and nausea.  Genitourinary: Negative for flank pain, hematuria and urgency.  Musculoskeletal: Positive for back pain. Negative for joint pain and neck pain.  Skin: Negative for itching.  Neurological: Negative for dizziness, tremors, seizures and headaches.  Endo/Heme/Allergies: Does not bruise/bleed easily.  Psychiatric/Behavioral: Positive for depression. Negative for memory loss, substance abuse and suicidal ideas. The patient is nervous/anxious.     EKGs/Labs/Other Studies Reviewed:    The following studies were reviewed today:  EKG:  The ekg ordered today demonstrates evidence of sinus rhythm, heart rate 70 bpm, left axis deviation abnormal precordial leads, concerning for old anterior septal wall infarction.  Recent Labs: 03/17/2018: B Natriuretic Peptide 11.5 03/18/2018: Magnesium 1.9 12/09/2018: ALT 8; BUN 7; Creatinine, Ser 0.71; Hemoglobin 9.1; Platelets 230.0; Potassium  4.2; Sodium 142; TSH 1.07  Recent Lipid Panel    Component Value Date/Time   CHOL 207 (H) 12/09/2018 0852   TRIG 342.0 (H) 12/09/2018 0852   HDL 36.80 (L) 12/09/2018 0852   CHOLHDL 6 12/09/2018 0852   VLDL 68.4 (H) 12/09/2018 0852   LDLCALC 123 (H) 03/18/2018 0347   LDLDIRECT 90.0 12/09/2018 0852    Physical Exam:    VS:  BP 118/90 (BP Location: Right Arm, Patient Position: Sitting, Cuff Size: Large)   Pulse 80   Ht 5\' 3"  (1.6 m)   Wt 236 lb (107 kg)   SpO2 97%  BMI 41.81 kg/m     Wt Readings from Last 3 Encounters:  12/17/18 236 lb (107 kg)  12/09/18 235 lb 4 oz (106.7 kg)  03/25/18 234 lb 4 oz (106.3 kg)     GEN:  well nourished, well developed in no acute distress HEENT: Normal NECK: No JVD; No carotid bruits LYMPHATICS: No lymphadenopathy CARDIAC: S1-S2 noted, RRR, no murmurs, rubs, gallops RESPIRATORY:  Clear to auscultation without rales, wheezing or rhonchi  ABDOMEN: Soft, non-tender, non-distended EXTREMITIES: Trace bilateral ankle edema, no cyanosis no clubbing. MUSCULOSKELETAL:  No deformity  SKIN: Warm and dry NEUROLOGIC:  Alert and oriented x 3, non-focal PSYCHIATRIC:  Normal affect, good insight.  ASSESSMENT:    1. Chest pain of uncertain etiology   2. Chest pain, unspecified type   3. Shortness of breath   4. Dyslipidemia (high LDL; low HDL)   5. Nonspecific abnormal electrocardiogram (ECG) (EKG)   6. OSA (obstructive sleep apnea)   7. Morbid obesity due to excess calories [facility](HCC)    PLAN:    [name] is here today for an initial evaluation due to chest pain.  Given her risk intermediate factors for coronary disease, I have discussed with patient to move forward with an ischemic evaluation-nuclear stress test.  She was educated about the test and she is in agreement with this.  A transthoracic echocardiogram has been ordered to assess her RV/LV function and for any rupture abnormalities which may be leading to shortness of breath especially in  the setting of her history of hypertension and obstructive sleep apnea.  I have reviewed her lipid panel (LDL 123, triglyceride 342, HDL 36 total cholesterol 207), at this time she prefers trying lifestyle modifications.  She previously has been on atorvastatin and another statin which she could not remember the name and she states that she did have significant muscle aches and pains.  Repeat lipid panel in 3 months, and if needed to start patient on lipid-lowering agent will try pravastatin and if she reacts will consider using Zetia and or PCK29 inhibitor.  She was encouraged to follow-up with her neurologist for planning a new sleep study to set up her CPAP.  The patient is in agreement with the above plan. She will follow up in 3 months. The patient left the office in stable condition.   Medication Adjustments/Labs and Tests Ordered: Current medicines are reviewed at length with the patient today.  Concerns regarding medicines are outlined above.  Orders Placed This Encounter  Procedures  . Lipid Profile  . MYOCARDIAL PERFUSION IMAGING  . EKG 12-Lead  . ECHOCARDIOGRAM COMPLETE   No orders of the defined types were placed in this encounter.   Patient Instructions  Medication Instructions:  Your physician recommends that you continue on your current medications as directed. Please refer to the Current Medication list given to you today.  If you need a refill on your cardiac medications before your next appointment, please call your pharmacy.   Lab work: Your physician recommends that you return for lab work in: 3 MONTHS LIPID(fasting)  If you have labs (blood work) drawn today and your tests are completely normal, you will receive your results only by: Marland Kitchen. MyChart Message (if you have MyChart) OR . A paper copy in the mail If you have any lab test that is abnormal or we need to change your treatment, we will call you to review the results.  Testing/Procedures: Your physician has  requested that you have a lexiscan myoview. For further  information please visit [email or web address]. Please follow instruction sheet, as given.  Your physician has requested that you have an echocardiogram. Echocardiography is a painless test that uses sound waves to create images of your heart. It provides your doctor with information about the size and shape of your heart and how well your heart's chambers and valves are working. This procedure takes approximately one hour. There are no restrictions for this procedure.    Follow-Up: At [facility], you and your health needs are our priority.  As part of our continuing mission to provide you with exceptional heart care, we have created designated Provider Care Teams.  These Care Teams include your primary Cardiologist (physician) and Advanced Practice Providers (APPs -  Physician Assistants and Nurse Practitioners) who all work together to provide you with the care you need, when you need it. You will need a follow up appointment in 3 months. months in advance to   You may see Thomasene RippleKardie Aseem Sessums, DO Any Other Special Instructions Will Be Listed Below (If Applicable).   Cardiac Nuclear Scan A cardiac nuclear scan is a test that is done to check the flow of blood to your heart. It is done when you are resting and when you are exercising. The test looks for problems such as:  Not enough blood reaching a portion of the heart.  The heart muscle not working as it should. You may need this test if:  You have heart disease.  You have had lab results that are not normal.  You have had heart surgery or a balloon procedure to open up blocked arteries (angioplasty).  You have chest pain.  You have shortness of breath. In this test, a special dye (tracer) is put into your bloodstream. The tracer will travel to your heart. A camera will then take pictures of your heart to see how the tracer moves through your heart. This test is usually done at a  hospital and takes 2-4 hours. Tell a doctor about:  Any allergies you have.  All medicines you are taking, including vitamins, herbs, eye drops, creams, and over-the-counter medicines.  Any problems you or family members have had with anesthetic medicines.  Any blood disorders you have.  Any surgeries you have had.  Any medical conditions you have.  Whether you are pregnant or may be pregnant. What are the risks? Generally, this is a safe test. However, problems may occur, such as:  Serious chest pain and heart attack. This is only a risk if the stress portion of the test is done.  Rapid heartbeat.  A feeling of warmth in your chest. This feeling usually does not last long.  Allergic reaction to the tracer. What happens before the test?  Ask your doctor about changing or stopping your normal medicines. This is important.  Follow instructions from your doctor about what you cannot eat or drink.  Remove your jewelry on the day of the test. What happens during the test?  An IV tube will be inserted into one of your veins.  Your doctor will give you a small amount of tracer through the IV tube.  You will wait for 20-40 minutes while the tracer moves through your bloodstream.  Your heart will be monitored with an electrocardiogram (ECG).  You will lie down on an exam table.  Pictures of your heart will be taken for about 15-20 minutes.  You may also have a stress test. For this test, one of these things may be done: ? You  will be asked to exercise on a treadmill or a stationary bike. ? You will be given medicines that will make your heart work harder. This is done if you are unable to exercise.  When blood flow to your heart has peaked, a tracer will again be given through the IV tube.  After 20-40 minutes, you will get back on the exam table. More pictures will be taken of your heart.  Depending on the tracer that is used, more pictures may need to be taken 3-4  hours later.  Your IV tube will be removed when the test is over. The test may vary among doctors and hospitals. What happens after the test?  Ask your doctor: ? Whether you can return to your normal schedule, including diet, activities, and medicines. ? Whether you should drink more fluids. This will help to remove the tracer from your body. Drink enough fluid to keep your pee (urine) pale yellow.  Ask your doctor, or the department that is doing the test: ? When will my results be ready? ? How will I get my results? Summary  A cardiac nuclear scan is a test that is done to check the flow of blood to your heart.  Tell your doctor whether you are pregnant or may be pregnant.  Before the test, ask your doctor about changing or stopping your normal medicines. This is important.  Ask your doctor whether you can return to your normal activities. You may be asked to drink more fluids. This information is not intended to replace advice given to you by your health care provider. Make sure you discuss any questions you have with your health care provider. Document Released: 09/09/2017 Document Revised: 07/16/2018 Document Reviewed: 09/09/2017 Elsevier Patient Education  [address]     Signed, Thomasene Ripple, DO  12/17/2018 5:17 PM    [facility]

## 2018-12-17 NOTE — Patient Instructions (Addendum)
Medication Instructions:  Your physician recommends that you continue on your current medications as directed. Please refer to the Current Medication list given to you today.  If you need a refill on your cardiac medications before your next appointment, please call your pharmacy.   Lab work: Your physician recommends that you return for lab work in: 3 MONTHS LIPID(fasting)  If you have labs (blood work) drawn today and your tests are completely normal, you will receive your results only by: Marland Kitchen. MyChart Message (if you have MyChart) OR . A paper copy in the mail If you have any lab test that is abnormal or we need to change your treatment, we will call you to review the results.  Testing/Procedures: Your physician has requested that you have a lexiscan myoview. For further information please visit [email or web address]. Please follow instruction sheet, as given.  Your physician has requested that you have an echocardiogram. Echocardiography is a painless test that uses sound waves to create images of your heart. It provides your doctor with information about the size and shape of your heart and how well your heart's chambers and valves are working. This procedure takes approximately one hour. There are no restrictions for this procedure.    Follow-Up: At [facility], you and your health needs are our priority.  As part of our continuing mission to provide you with exceptional heart care, we have created designated Provider Care Teams.  These Care Teams include your primary Cardiologist (physician) and Advanced Practice Providers (APPs -  Physician Assistants and Nurse Practitioners) who all work together to provide you with the care you need, when you need it. You will need a follow up appointment in 3 months. months in advance to   You may see Thomasene RippleKardie Tobb, DO Any Other Special Instructions Will Be Listed Below (If Applicable).   Cardiac Nuclear Scan A cardiac nuclear scan is a test that  is done to check the flow of blood to your heart. It is done when you are resting and when you are exercising. The test looks for problems such as:  Not enough blood reaching a portion of the heart.  The heart muscle not working as it should. You may need this test if:  You have heart disease.  You have had lab results that are not normal.  You have had heart surgery or a balloon procedure to open up blocked arteries (angioplasty).  You have chest pain.  You have shortness of breath. In this test, a special dye (tracer) is put into your bloodstream. The tracer will travel to your heart. A camera will then take pictures of your heart to see how the tracer moves through your heart. This test is usually done at a hospital and takes 2-4 hours. Tell a doctor about:  Any allergies you have.  All medicines you are taking, including vitamins, herbs, eye drops, creams, and over-the-counter medicines.  Any problems you or family members have had with anesthetic medicines.  Any blood disorders you have.  Any surgeries you have had.  Any medical conditions you have.  Whether you are pregnant or may be pregnant. What are the risks? Generally, this is a safe test. However, problems may occur, such as:  Serious chest pain and heart attack. This is only a risk if the stress portion of the test is done.  Rapid heartbeat.  A feeling of warmth in your chest. This feeling usually does not last long.  Allergic reaction to the tracer. What happens before  the test?  Ask your doctor about changing or stopping your normal medicines. This is important.  Follow instructions from your doctor about what you cannot eat or drink.  Remove your jewelry on the day of the test. What happens during the test?  An IV tube will be inserted into one of your veins.  Your doctor will give you a small amount of tracer through the IV tube.  You will wait for 20-40 minutes while the tracer moves through  your bloodstream.  Your heart will be monitored with an electrocardiogram (ECG).  You will lie down on an exam table.  Pictures of your heart will be taken for about 15-20 minutes.  You may also have a stress test. For this test, one of these things may be done: ? You will be asked to exercise on a treadmill or a stationary bike. ? You will be given medicines that will make your heart work harder. This is done if you are unable to exercise.  When blood flow to your heart has peaked, a tracer will again be given through the IV tube.  After 20-40 minutes, you will get back on the exam table. More pictures will be taken of your heart.  Depending on the tracer that is used, more pictures may need to be taken 3-4 hours later.  Your IV tube will be removed when the test is over. The test may vary among doctors and hospitals. What happens after the test?  Ask your doctor: ? Whether you can return to your normal schedule, including diet, activities, and medicines. ? Whether you should drink more fluids. This will help to remove the tracer from your body. Drink enough fluid to keep your pee (urine) pale yellow.  Ask your doctor, or the department that is doing the test: ? When will my results be ready? ? How will I get my results? Summary  A cardiac nuclear scan is a test that is done to check the flow of blood to your heart.  Tell your doctor whether you are pregnant or may be pregnant.  Before the test, ask your doctor about changing or stopping your normal medicines. This is important.  Ask your doctor whether you can return to your normal activities. You may be asked to drink more fluids. This information is not intended to replace advice given to you by your health care provider. Make sure you discuss any questions you have with your health care provider. Document Released: 09/09/2017 Document Revised: 07/16/2018 Document Reviewed: 09/09/2017 Elsevier Patient Education  2020  [employer].

## 2018-12-18 ENCOUNTER — Encounter (HOSPITAL_COMMUNITY): Payer: Managed Care, Other (non HMO)

## 2018-12-24 ENCOUNTER — Encounter (HOSPITAL_COMMUNITY): Payer: Self-pay | Admitting: *Deleted

## 2018-12-24 ENCOUNTER — Telehealth (HOSPITAL_COMMUNITY): Payer: Self-pay | Admitting: *Deleted

## 2018-12-24 NOTE — Telephone Encounter (Signed)
Attempted to leave message on voicemail in reference to upcoming appointment scheduled for 12/26/2018 but no answser.  [place]  [place] letter sent with instructions

## 2018-12-25 ENCOUNTER — Telehealth (HOSPITAL_COMMUNITY): Payer: Self-pay | Admitting: *Deleted

## 2018-12-25 NOTE — Telephone Encounter (Signed)
Attempted to call patient regarding upcoming appointment- no answer.  Leslie Rice  

## 2018-12-26 ENCOUNTER — Ambulatory Visit (HOSPITAL_BASED_OUTPATIENT_CLINIC_OR_DEPARTMENT_OTHER): Payer: Managed Care, Other (non HMO)

## 2018-12-26 ENCOUNTER — Other Ambulatory Visit: Payer: Self-pay

## 2018-12-26 ENCOUNTER — Ambulatory Visit (HOSPITAL_COMMUNITY): Payer: Managed Care, Other (non HMO) | Attending: Cardiology

## 2018-12-26 VITALS — Ht 63.0 in | Wt 236.0 lb

## 2018-12-26 DIAGNOSIS — R0789 Other chest pain: Secondary | ICD-10-CM | POA: Diagnosis not present

## 2018-12-26 DIAGNOSIS — R079 Chest pain, unspecified: Secondary | ICD-10-CM

## 2018-12-26 DIAGNOSIS — R9431 Abnormal electrocardiogram [ECG] [EKG]: Secondary | ICD-10-CM | POA: Insufficient documentation

## 2018-12-26 DIAGNOSIS — R0602 Shortness of breath: Secondary | ICD-10-CM

## 2018-12-26 LAB — MYOCARDIAL PERFUSION IMAGING
LV dias vol: 69 mL (ref 46–106)
LV sys vol: 25 mL
Peak HR: 100 {beats}/min
Rest HR: 67 {beats}/min
SDS: 1
SRS: 0
SSS: 1
TID: 0.97

## 2018-12-26 LAB — ECHOCARDIOGRAM COMPLETE
Height: 63 in
Weight: 3776 oz

## 2018-12-26 MED ORDER — TECHNETIUM TC 99M TETROFOSMIN IV KIT
32.7000 | PACK | Freq: Once | INTRAVENOUS | Status: AC | PRN
  Administered 2018-12-26: 32.7 via INTRAVENOUS
  Filled 2018-12-26: qty 33

## 2018-12-26 MED ORDER — REGADENOSON 0.4 MG/5ML IV SOLN
0.4000 mg | Freq: Once | INTRAVENOUS | Status: AC
  Administered 2018-12-26: 0.4 mg via INTRAVENOUS

## 2018-12-26 MED ORDER — PERFLUTREN LIPID MICROSPHERE
1.0000 mL | INTRAVENOUS | Status: AC | PRN
  Administered 2018-12-26: 2 mL via INTRAVENOUS

## 2018-12-26 MED ORDER — TECHNETIUM TC 99M TETROFOSMIN IV KIT
10.7000 | PACK | Freq: Once | INTRAVENOUS | Status: AC | PRN
  Administered 2018-12-26: 10.7 via INTRAVENOUS
  Filled 2018-12-26: qty 11

## 2019-01-05 ENCOUNTER — Encounter: Payer: Self-pay | Admitting: Adult Health

## 2019-01-05 ENCOUNTER — Other Ambulatory Visit: Payer: Self-pay

## 2019-01-05 ENCOUNTER — Ambulatory Visit (INDEPENDENT_AMBULATORY_CARE_PROVIDER_SITE_OTHER): Payer: 59 | Admitting: Adult Health

## 2019-01-05 DIAGNOSIS — F411 Generalized anxiety disorder: Secondary | ICD-10-CM

## 2019-01-05 DIAGNOSIS — F331 Major depressive disorder, recurrent, moderate: Secondary | ICD-10-CM

## 2019-01-05 DIAGNOSIS — G4700 Insomnia, unspecified: Secondary | ICD-10-CM

## 2019-01-05 MED ORDER — ALPRAZOLAM 0.25 MG PO TABS: 0.2500 mg | ORAL_TABLET | Freq: Three times a day (TID) | ORAL | 2 refills | Status: DC | PRN

## 2019-01-05 MED ORDER — FLUOXETINE HCL 20 MG PO CAPS: 20.0000 mg | ORAL_CAPSULE | Freq: Every day | ORAL | 2 refills | Status: DC

## 2019-01-05 MED ORDER — HYDROXYZINE HCL 25 MG PO TABS: 25.0000 mg | ORAL_TABLET | Freq: Three times a day (TID) | ORAL | 0 refills | Status: DC | PRN

## 2019-01-05 NOTE — Progress Notes (Signed)
[facility] MD/PA/NP Initial Note  01/05/2019 9:15 AM Leslie Rice  MRN:  [id number]  Chief Complaint:   HPI:   Referred by PCP  Describes mood today as "not the best". Pleasant. Flat. Mood symptoms - depression, anxiety, and irritability. Stating "I have been feeling this way for the past 10 years". Worries about "everything". Constantly "wringing my hands". Difficulties "cutting mind off at night". The Covid-19 and both of her daughters leaving has been "difficult". Doesn't recall any previous medications being helpful. 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 Stable interest and motivation. Taking medications as prescribed.  Energy levels stable. Active, does not have a regular exercise routine. Plans to start going back to the gym [employer] on her lunch breaks. 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  Enjoys some usual interests and activities. Stating "that's a problem". Needs to find something to do but thinks "what's the use". Divorced - married 9 years. Has 2 children 22 and 19.  One daughter just graduated from [employer]. Other daughter at [employer] - home d/t Covid.  Appetite adequate. Eating more "fast foods". Weight stable - "I need to do something about it". Sleeping difficulties. Averages 8 to 9 hours of broken sleep. Taking Benadryl to fall asleep.Trying to use CPAP machine - "hasn't help". Has tried using it "on and off".   Focus and concentration "it's tough". Feels overwhelmed and doesn't know where to start. Completing tasks. Managing aspects of household. Having issues at work - "feels like it is just too much". Past 6 months has worked 7 days a week all day every day.  Denies SI or HI. Denies AH or VH.  Previously seen by: Valinda HoarMeredith Baker   Previous medications: Zoloft, Wellbutrin, Lexapro, Effexor  Visit Diagnosis:    ICD-10-CM   1. Insomnia, unspecified type  G47.00   2. Generalized anxiety disorder  F41.1   3. Major depressive  disorder, recurrent episode, moderate (HCC)  F33.1     Past Psychiatric History: Denies  Past Medical History:  Past Medical History:  Diagnosis Date  . ALLERGIC RHINITIS   . Chronic pain syndrome    thoracic back pain, seeing Dr. Ethelene Halamos  . DEPRESSION   . DVT (deep venous thrombosis) (HCC)   . ELEVATED BLOOD PRESSURE   . GERD   . HYPERLIPIDEMIA   . INSOMNIA   . Irritable bowel syndrome   . MIGRAINE HEADACHE   . OSA (obstructive sleep apnea)    not on Cpap @ present 11-2013  . Primary hypercoagulable state (HCC)   . Pulmonary emboli [facility](HCC)     Past Surgical History:  Procedure Laterality Date  . c section     x 2   . CARPAL TUNNEL RELEASE     B  . NASAL SINUS SURGERY      Family Psychiatric History: Denies  Family History:  Family History  Problem Relation Age of Onset  . Colon cancer Other        M, aunt, nephew  . CAD Father        age? "silent MIs"  . Lung cancer Father   . Diabetes Father        F, sisters x2  . Thyroid cancer Sister   . Colon cancer Sister        dx age 51  . Breast cancer Neg Hx   . Stroke Neg Hx     Social History:  Social History   Socioeconomic History  .  Marital status: Single    Spouse name: Not on file  . Number of children: 2  . Years of education: Not on file  . Highest education level: Not on file  Occupational History  . 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 Financial resource strain: Not on file  . Food insecurity    Worry: Not on file    Inability: Not on file  . Transportation needs    Medical: Not on file    Non-medical: Not on file  Tobacco Use  . Smoking status: Never Smoker  . Smokeless tobacco: Never Used  Substance and Sexual Activity  . Alcohol use: Yes    Comment: occ  . Drug use: No  . Sexual activity: Not Currently    Birth control/protection: I.U.D.  Lifestyle  . Physical activity    Days per week: Not on file    Minutes per session: Not on file  . Stress: Not on file   Relationships  . Social Herbalist on phone: Not on file    Gets together: Not on file    Attends religious service: Not on file    Active member of club or organization: Not on file    Attends meetings of clubs or organizations: Not on file    Relationship status: Not on file  Other Topics Concern  . Not on file  Social History Narrative   1 daughter live w/ her    One graduated from [place], lives in [place]        Allergies:  Allergies  Allergen Reactions  . Ciprofloxacin     REACTION: vomiting  . Codeine     REACTION: vomiting  . Sulfonamide Derivatives     hives    Metabolic Disorder Labs: No results found for: HGBA1C, MPG No results found for: PROLACTIN Lab Results  Component Value Date   CHOL 207 (H) 12/09/2018   TRIG 342.0 (H) 12/09/2018   HDL 36.80 (L) 12/09/2018   CHOLHDL 6 12/09/2018   VLDL 68.4 (H) 12/09/2018   LDLCALC 123 (H) 03/18/2018   Lab Results  Component Value Date   TSH 1.07 12/09/2018   TSH 3.651 03/18/2018    Therapeutic Level Labs: No results found for: LITHIUM No results found for: VALPROATE No components found for:  CBMZ  Current Medications: Current Outpatient Medications  Medication Sig Dispense Refill  . ALPRAZolam (XANAX) 0.5 MG tablet Take 1 tablet (0.5 mg total) by mouth 2 (two) times daily as needed. 60 tablet 1  . Ferrous Sulfate (IRON) 325 (65 Fe) MG TABS Take 1 tablet (325 mg total) by mouth 2 (two) times daily before a meal. 60 tablet 3  . HYDROcodone-acetaminophen (NORCO) 10-325 MG per tablet Take 1 tablet by mouth every 6 (six) hours as needed for moderate pain.     . metoprolol tartrate (LOPRESSOR) 25 MG tablet Take 1 tablet (25 mg total) by mouth 2 (two) times daily. 60 tablet 3  . promethazine (PHENERGAN) 25 MG tablet Take 1 tablet (25 mg total) by mouth every 6 (six) hours as needed for nausea or vomiting. 30 tablet 1  . rivaroxaban (XARELTO) 20 MG TABS tablet Take 1 tablet (20 mg total) by mouth daily  with supper. 90 tablet 1  . SF 5000 PLUS 1.1 % CREA dental cream     . SUMAtriptan (IMITREX) 50 MG tablet One tablet by mouth at start of migraine.  May repeat in 2 hours if no improvement x  1 dose. 10 tablet 2   No current facility-administered medications for this visit.     Medication Side Effects: none  Orders placed this visit:  No orders of the defined types were placed in this encounter.   Psychiatric Specialty Exam:  Review of Systems  Neurological: Negative for tremors and weakness.    There were no vitals taken for this visit.There is no height or weight on file to calculate BMI.  General Appearance: Neat and Well Groomed  Eye Contact:  Good  Speech:  Normal Rate  Volume:  Normal  Mood:  Anxious, Depressed and Irritable  Affect:  Congruent  Thought Process:  Coherent  Orientation:  Full (Time, Place, and Person)  Thought Content: Logical   Suicidal Thoughts:  No  Homicidal Thoughts:  No  Memory:  WNL  Judgement:  Intact  Insight:  Good  Psychomotor Activity:  Normal  Concentration:  Concentration: Good  Recall:  Good  Fund of Knowledge: Good  Language: Good  Assets:  Communication Skills Desire for Improvement Financial Resources/Insurance Housing Intimacy Leisure Time Physical Health Resilience Social Support Talents/Skills Transportation Vocational/Educational  ADL's:  Intact  Cognition: WNL  Prognosis:  Good   Screenings:  PHQ2-9     Office Visit from 12/09/2018 in [facility] Office Visit from 11/05/2016 in [facility] Office Visit from 03/26/2016 in [facility] Office Visit from 09/30/2015 in [facility] Office Visit from 04/08/2015 in [facility]  PHQ-2 Total Score  1  0  0  0  0  PHQ-9 Total Score  8  0  -  -  -      Receiving  Psychotherapy: No   Treatment Plan/Recommendations:   Plan:  1. Add Prozac 20mg  daily 2. Add Hydroxyzine 25mg  tablet - 1 to2 at bedtime 3. D/C Xanax 0.5mg  BID  4. Add Xanax 0.25mg  TID prn anxiety  Consider Abilify 2mg  daily  RTC 4 weeks  Patient advised to contact office with any questions, adverse effects, or acute worsening in signs and symptoms.   , NP

## 2019-01-13 DIAGNOSIS — N921 Excessive and frequent menstruation with irregular cycle: Secondary | ICD-10-CM | POA: Insufficient documentation

## 2019-01-16 HISTORY — PX: HYSTEROSCOPY W/ ENDOMETRIAL ABLATION: SUR665

## 2019-02-02 ENCOUNTER — Encounter: Payer: Self-pay | Admitting: Adult Health

## 2019-02-02 ENCOUNTER — Other Ambulatory Visit: Payer: Self-pay

## 2019-02-02 ENCOUNTER — Ambulatory Visit (INDEPENDENT_AMBULATORY_CARE_PROVIDER_SITE_OTHER): Payer: 59 | Admitting: Adult Health

## 2019-02-02 DIAGNOSIS — F411 Generalized anxiety disorder: Secondary | ICD-10-CM

## 2019-02-02 DIAGNOSIS — G4700 Insomnia, unspecified: Secondary | ICD-10-CM | POA: Diagnosis not present

## 2019-02-02 DIAGNOSIS — F3181 Bipolar II disorder: Secondary | ICD-10-CM | POA: Diagnosis not present

## 2019-02-02 DIAGNOSIS — F331 Major depressive disorder, recurrent, moderate: Secondary | ICD-10-CM

## 2019-02-02 MED ORDER — HYDROXYZINE HCL 25 MG PO TABS: 25.0000 mg | ORAL_TABLET | Freq: Three times a day (TID) | ORAL | 1 refills | Status: DC | PRN

## 2019-02-02 MED ORDER — LATUDA 20 MG PO TABS: ORAL_TABLET | ORAL | 2 refills | Status: DC

## 2019-02-02 MED ORDER — FLUOXETINE HCL 20 MG PO CAPS: 20.0000 mg | ORAL_CAPSULE | Freq: Every day | ORAL | 1 refills | Status: DC

## 2019-02-02 NOTE — Progress Notes (Signed)
[facility] MD/PA/NP Medication Check  Chief Complaint:   HPI:   Referred by PCP  Describes mood today as "a little better". Pleasant. Mood symptoms - decreased depression, anxiety, and irritability. Stating "I haven't been as weeping". Not getting as moody. Saw a commercial on TV for BPD and thinks it "describes me to a T". Decreased worry and rumination. Decreased "wringing". Has been able to "cut mind of at night". Daughters doing well. 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  Stable interest and motivation. Taking medications as prescribed.  Energy levels "a little low". Going to the gym twice a week and walking. 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  Enjoys some usual interests and activities. Has 2 children 22 and 19.  One daughter just graduated from [employer]. Other daughter at [employer] - home d/t Covid. Talking to daughters. Mostly staying home. Brother in [place] diagnosed with Brain cancer. Appetite adequate. Weight stable. Sleeping difficulties. Averages 8 to 9 hours. Getting up to take out dog once a night. Needs to get replacements parts for CPAP machine.   Focus and concentration difficulties. Not as "overwhelmed". Completing tasks. Managing aspects of household. Work going well - "busy".   Denies SI or HI. Denies AH or VH.  Previously seen by: Valinda HoarMeredith Baker   Previous medications: Zoloft, Wellbutrin, Lexapro, Effexor  Visit Diagnosis:    ICD-10-CM   1. Insomnia, unspecified type  G47.00   2. Generalized anxiety disorder  F41.1   3. Major depressive disorder, recurrent episode, moderate (HCC)  F33.1     Past Psychiatric History: Denies  Past Medical History:  Past Medical History:  Diagnosis Date  . ALLERGIC RHINITIS   . Chronic pain syndrome    thoracic back pain, seeing Dr. Ethelene Halamos  . DEPRESSION   . DVT (deep venous thrombosis) (HCC)   . ELEVATED BLOOD PRESSURE   . GERD   . HYPERLIPIDEMIA   . INSOMNIA   . Irritable bowel syndrome    . MIGRAINE HEADACHE   . OSA (obstructive sleep apnea)    not on Cpap @ present 11-2013  . Primary hypercoagulable state (HCC)   . Pulmonary emboli [facility](HCC)     Past Surgical History:  Procedure Laterality Date  . c section     x 2   . CARPAL TUNNEL RELEASE     B  . NASAL SINUS SURGERY      Family Psychiatric History: Denies  Family History:  Family History  Problem Relation Age of Onset  . Colon cancer Other        M, aunt, nephew  . CAD Father        age? "silent MIs"  . Lung cancer Father   . Diabetes Father        F, sisters x2  . Thyroid cancer Sister   . Colon cancer Sister        dx age 51  . Breast cancer Neg Hx   . Stroke Neg Hx     Social History:  Social History   Socioeconomic History  . Marital status: Single    Spouse name: Not on file  . Number of children: 2  . Years of education: Not on file  . Highest education level: Not on file  Occupational History  . 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 Financial resource strain: Not on file  . Food insecurity    Worry: Not on file    Inability: Not on file  .  Transportation needs    Medical: Not on file    Non-medical: Not on file  Tobacco Use  . Smoking status: Never Smoker  . Smokeless tobacco: Never Used  Substance and Sexual Activity  . Alcohol use: Yes    Comment: occ  . Drug use: No  . Sexual activity: Not Currently    Birth control/protection: I.U.D.  Lifestyle  . Physical activity    Days per week: Not on file    Minutes per session: Not on file  . Stress: Not on file  Relationships  . Social Musician on phone: Not on file    Gets together: Not on file    Attends religious service: Not on file    Active member of club or organization: Not on file    Attends meetings of clubs or organizations: Not on file    Relationship status: Not on file  Other Topics Concern  . Not on file  Social History Narrative   1 daughter live w/ her    One graduated from [employer],  lives in [place]        Allergies:  Allergies  Allergen Reactions  . Ciprofloxacin     REACTION: vomiting  . Codeine     REACTION: vomiting  . Sulfonamide Derivatives     hives    Metabolic Disorder Labs: No results found for: HGBA1C, MPG No results found for: PROLACTIN Lab Results  Component Value Date   CHOL 207 (H) 12/09/2018   TRIG 342.0 (H) 12/09/2018   HDL 36.80 (L) 12/09/2018   CHOLHDL 6 12/09/2018   VLDL 68.4 (H) 12/09/2018   LDLCALC 123 (H) 03/18/2018   Lab Results  Component Value Date   TSH 1.07 12/09/2018   TSH 3.651 03/18/2018    Therapeutic Level Labs: No results found for: LITHIUM No results found for: VALPROATE No components found for:  CBMZ  Current Medications: Current Outpatient Medications  Medication Sig Dispense Refill  . ALPRAZolam (XANAX) 0.25 MG tablet Take 1 tablet (0.25 mg total) by mouth 3 (three) times daily as needed for anxiety. 90 tablet 2  . ALPRAZolam (XANAX) 0.5 MG tablet Take 1 tablet (0.5 mg total) by mouth 2 (two) times daily as needed. 60 tablet 1  . Ferrous Sulfate (IRON) 325 (65 Fe) MG TABS Take 1 tablet (325 mg total) by mouth 2 (two) times daily before a meal. 60 tablet 3  . FLUoxetine (PROZAC) 20 MG capsule Take 1 capsule (20 mg total) by mouth daily. 30 capsule 2  . HYDROcodone-acetaminophen (NORCO) 10-325 MG per tablet Take 1 tablet by mouth every 6 (six) hours as needed for moderate pain.     . hydrOXYzine (ATARAX/VISTARIL) 25 MG tablet Take 1 tablet (25 mg total) by mouth 3 (three) times daily as needed. 30 tablet 0  . metoprolol tartrate (LOPRESSOR) 25 MG tablet Take 1 tablet (25 mg total) by mouth 2 (two) times daily. 60 tablet 3  . promethazine (PHENERGAN) 25 MG tablet Take 1 tablet (25 mg total) by mouth every 6 (six) hours as needed for nausea or vomiting. 30 tablet 1  . rivaroxaban (XARELTO) 20 MG TABS tablet Take 1 tablet (20 mg total) by mouth daily with supper. 90 tablet 1  . SF 5000 PLUS 1.1 % CREA dental  cream     . SUMAtriptan (IMITREX) 50 MG tablet One tablet by mouth at start of migraine.  May repeat in 2 hours if no improvement x 1 dose. 10 tablet 2  No current facility-administered medications for this visit.     Medication Side Effects: none  Orders placed this visit:  No orders of the defined types were placed in this encounter.   Psychiatric Specialty Exam:  Review of Systems  Neurological: Negative for tremors and weakness.    There were no vitals taken for this visit.There is no height or weight on file to calculate BMI.  General Appearance: Neat and Well Groomed  Eye Contact:  Good  Speech:  Normal Rate  Volume:  Normal  Mood:  Anxious, Depressed and Irritable  Affect:  Congruent  Thought Process:  Coherent  Orientation:  Full (Time, Place, and Person)  Thought Content: Logical   Suicidal Thoughts:  No  Homicidal Thoughts:  No  Memory:  WNL  Judgement:  Intact  Insight:  Good  Psychomotor Activity:  Normal  Concentration:  Concentration: Good  Recall:  Good  Fund of Knowledge: Good  Language: Good  Assets:  Communication Skills Desire for Improvement Financial Resources/Insurance Housing Intimacy Leisure Time Physical Health Resilience Social Support Talents/Skills Transportation Vocational/Educational  ADL's:  Intact  Cognition: WNL  Prognosis:  Good   Screenings:  PHQ2-9     Office Visit from 12/09/2018 in [facility] at [place] Visit from 11/05/2016 in [facility] at [place] Visit from 03/26/2016 in [facility] [place] Visit from 09/30/2015 in [facility] [place] Visit from 04/08/2015 in [facility]  PHQ-2 Total Score  1  0  0  0  0  PHQ-9 Total Score  8  0  -  -  -      Receiving Psychotherapy: No   Treatment Plan/Recommendations:   Plan:  1.  Prozac 20mg  daily 2. Hydroxyzine 25mg  tablet - 1 to 3 at bedtime 3. Xanax 0.25mg  TID prn anxiety 4. Add Latuda 20mg  daily - 1 pack of 7 days samples given and script sent   RTC 4 weeks  Patient advised to contact office with any questions, adverse effects, or acute worsening in signs and symptoms.   Aloha Gell, NP

## 2019-02-04 DIAGNOSIS — R898 Other abnormal findings in specimens from other organs, systems and tissues: Secondary | ICD-10-CM | POA: Insufficient documentation

## 2019-02-13 ENCOUNTER — Ambulatory Visit: Payer: Managed Care, Other (non HMO) | Admitting: Internal Medicine

## 2019-03-02 ENCOUNTER — Other Ambulatory Visit: Payer: Self-pay | Admitting: Adult Health

## 2019-03-02 ENCOUNTER — Other Ambulatory Visit: Payer: Self-pay

## 2019-03-02 ENCOUNTER — Ambulatory Visit: Payer: 59 | Admitting: Adult Health

## 2019-03-02 DIAGNOSIS — F331 Major depressive disorder, recurrent, moderate: Secondary | ICD-10-CM

## 2019-03-02 DIAGNOSIS — F411 Generalized anxiety disorder: Secondary | ICD-10-CM

## 2019-03-02 DIAGNOSIS — G4700 Insomnia, unspecified: Secondary | ICD-10-CM

## 2019-03-02 DIAGNOSIS — F3181 Bipolar II disorder: Secondary | ICD-10-CM

## 2019-03-02 MED ORDER — METOPROLOL TARTRATE 25 MG PO TABS: 25.0000 mg | ORAL_TABLET | Freq: Two times a day (BID) | ORAL | 1 refills | Status: DC

## 2019-03-02 MED ORDER — FLUOXETINE HCL 20 MG PO CAPS: 20.0000 mg | ORAL_CAPSULE | Freq: Every day | ORAL | 1 refills | Status: DC

## 2019-03-02 MED ORDER — HYDROXYZINE HCL 25 MG PO TABS: 25.0000 mg | ORAL_TABLET | Freq: Three times a day (TID) | ORAL | 1 refills | Status: DC | PRN

## 2019-03-02 MED ORDER — LURASIDONE HCL 40 MG PO TABS: 40.0000 mg | ORAL_TABLET | Freq: Every day | ORAL | 1 refills | Status: DC

## 2019-03-02 MED ORDER — ALPRAZOLAM 0.25 MG PO TABS: 0.2500 mg | ORAL_TABLET | Freq: Three times a day (TID) | ORAL | 2 refills | Status: DC | PRN

## 2019-03-02 NOTE — Progress Notes (Deleted)
[facility] MD/PA/NP Medication Check  Chief Complaint:   HPI:   Referred by PCP  Describes mood today as "a little better". Pleasant. Mood symptoms - decreased depression, anxiety, and irritability. Stating "I haven't been as weeping". Not getting as moody. Saw a commercial on TV for BPD and thinks it "describes me to a T". Decreased worry and rumination. Decreased "wringing". Has been able to "cut mind of at night". Daughters doing well. 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  Stable interest and motivation. Taking medications as prescribed.  Energy levels "a little low". Going to the gym twice a week and walking. Works full-time in Consulting civil engineer.  Enjoys some usual interests and activities. Has 2 children 22 and 19.  One daughter just graduated from [employer]. Other daughter at [employer] - home d/t Covid. Talking to daughters. Mostly staying home. Brother in [place] diagnosed with Brain cancer. Appetite adequate. Weight stable. Sleeping difficulties. Averages 8 to 9 hours. Getting up to take out dog once a night. Needs to get replacements parts for CPAP machine.   Focus and concentration difficulties. Not as "overwhelmed". Completing tasks. Managing aspects of household. Work going well - "busy".   Denies SI or HI. Denies AH or VH.  Previously seen by: Valinda Hoar   Previous medications: Zoloft, Wellbutrin, Lexapro, Effexor  Visit Diagnosis:  No diagnosis found.  Past Psychiatric History: Denies  Past Medical History:  Past Medical History:  Diagnosis Date  . ALLERGIC RHINITIS   . Chronic pain syndrome    thoracic back pain, seeing Dr. Ethelene Hal  . DEPRESSION   . DVT (deep venous thrombosis) (HCC)   . ELEVATED BLOOD PRESSURE   . GERD   . HYPERLIPIDEMIA   . INSOMNIA   . Irritable bowel syndrome   . MIGRAINE HEADACHE   . OSA (obstructive sleep apnea)    not on Cpap @ present 11-2013  . Primary hypercoagulable state (HCC)   . Pulmonary emboli  [facility])     Past Surgical History:  Procedure Laterality Date  . c section     x 2   . CARPAL TUNNEL RELEASE     B  . HYSTEROSCOPY W/ ENDOMETRIAL ABLATION  01/16/2019  . NASAL SINUS SURGERY      Family Psychiatric History: Denies  Family History:  Family History  Problem Relation Age of Onset  . Colon cancer Other        M, aunt, nephew  . CAD Father        age? "silent MIs"  . Lung cancer Father   . Diabetes Father        F, sisters x2  . Thyroid cancer Sister   . Colon cancer Sister        dx age 63  . Breast cancer Neg Hx   . Stroke Neg Hx     Social History:  Social History   Socioeconomic History  . Marital status: Single    Spouse name: Not on file  . Number of children: 2  . Years of education: Not on file  . Highest education level: Not on file  Occupational History  . 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 Financial resource strain: Not on file  . Food insecurity    Worry: Not on file    Inability: Not on file  . Transportation needs    Medical: Not on file    Non-medical: Not on file  Tobacco Use  . Smoking  status: Never Smoker  . Smokeless tobacco: Never Used  Substance and Sexual Activity  . Alcohol use: Yes    Comment: occ  . Drug use: No  . Sexual activity: Not Currently    Birth control/protection: I.U.D.  Lifestyle  . Physical activity    Days per week: Not on file    Minutes per session: Not on file  . Stress: Not on file  Relationships  . Social Herbalist on phone: Not on file    Gets together: Not on file    Attends religious service: Not on file    Active member of club or organization: Not on file    Attends meetings of clubs or organizations: Not on file    Relationship status: Not on file  Other Topics Concern  . Not on file  Social History Narrative   1 daughter live w/ her    One graduated from [place], lives in [place]        Allergies:  Allergies  Allergen Reactions  . Ciprofloxacin      REACTION: vomiting  . Codeine     REACTION: vomiting  . Sulfonamide Derivatives     hives    Metabolic Disorder Labs: No results found for: HGBA1C, MPG No results found for: PROLACTIN Lab Results  Component Value Date   CHOL 207 (H) 12/09/2018   TRIG 342.0 (H) 12/09/2018   HDL 36.80 (L) 12/09/2018   CHOLHDL 6 12/09/2018   VLDL 68.4 (H) 12/09/2018   LDLCALC 123 (H) 03/18/2018   Lab Results  Component Value Date   TSH 1.07 12/09/2018   TSH 3.651 03/18/2018    Therapeutic Level Labs: No results found for: LITHIUM No results found for: VALPROATE No components found for:  CBMZ  Current Medications: Current Outpatient Medications  Medication Sig Dispense Refill  . ALPRAZolam (XANAX) 0.25 MG tablet Take 1 tablet (0.25 mg total) by mouth 3 (three) times daily as needed for anxiety. 90 tablet 2  . FLUoxetine (PROZAC) 20 MG capsule Take 1 capsule (20 mg total) by mouth daily. 90 capsule 1  . HYDROcodone-acetaminophen (NORCO) 10-325 MG per tablet Take 1 tablet by mouth every 6 (six) hours as needed for moderate pain.     . hydrOXYzine (ATARAX/VISTARIL) 25 MG tablet Take 1 tablet (25 mg total) by mouth 3 (three) times daily as needed. 270 tablet 1  . lurasidone (LATUDA) 20 MG TABS tablet Take one tablet daily with 350 calories. 30 tablet 2  . metoprolol tartrate (LOPRESSOR) 25 MG tablet Take 1 tablet (25 mg total) by mouth 2 (two) times daily. 60 tablet 3  . promethazine (PHENERGAN) 25 MG tablet Take 1 tablet (25 mg total) by mouth every 6 (six) hours as needed for nausea or vomiting. 30 tablet 1  . rivaroxaban (XARELTO) 20 MG TABS tablet Take 1 tablet (20 mg total) by mouth daily with supper. 90 tablet 1  . SF 5000 PLUS 1.1 % CREA dental cream     . SUMAtriptan (IMITREX) 50 MG tablet One tablet by mouth at start of migraine.  May repeat in 2 hours if no improvement x 1 dose. 10 tablet 2   No current facility-administered medications for this visit.     Medication Side Effects:  none  Orders placed this visit:  No orders of the defined types were placed in this encounter.   Psychiatric Specialty Exam:  Review of Systems  Neurological: Negative for tremors and weakness.    There were no  vitals taken for this visit.There is no height or weight on file to calculate BMI.  General Appearance: Neat and Well Groomed  Eye Contact:  Good  Speech:  Normal Rate  Volume:  Normal  Mood:  Anxious, Depressed and Irritable  Affect:  Congruent  Thought Process:  Coherent  Orientation:  Full (Time, Place, and Person)  Thought Content: Logical   Suicidal Thoughts:  No  Homicidal Thoughts:  No  Memory:  WNL  Judgement:  Intact  Insight:  Good  Psychomotor Activity:  Normal  Concentration:  Concentration: Good  Recall:  Good  Fund of Knowledge: Good  Language: Good  Assets:  Communication Skills Desire for Improvement Financial Resources/Insurance Housing Intimacy Leisure Time Physical Health Resilience Social Support Talents/Skills Transportation Vocational/Educational  ADL's:  Intact  Cognition: WNL  Prognosis:  Good   Screenings:  PHQ2-9     [facility]  PHQ-2 Total Score  1  0  0  0  0  PHQ-9 Total Score  8  0  -  -  -      Receiving Psychotherapy: No   Treatment Plan/Recommendations:   Plan:  1. Prozac 20mg  daily 2. Hydroxyzine 25mg  tablet - 1 to 3 at bedtime 3. Xanax 0.25mg  TID prn anxiety 4. Add Latuda 20mg  daily - 1 pack of 7 days samples given and script sent   RTC 4 weeks  Patient advised to contact office with any questions,  adverse effects, or acute worsening in signs and symptoms.   Dorothyann Gibbsegina N Rylie [place], NP

## 2019-03-04 ENCOUNTER — Other Ambulatory Visit: Payer: Self-pay

## 2019-03-04 MED ORDER — METOPROLOL TARTRATE 25 MG PO TABS: 25.0000 mg | ORAL_TABLET | Freq: Two times a day (BID) | ORAL | 0 refills | Status: DC

## 2019-03-09 ENCOUNTER — Other Ambulatory Visit: Payer: Self-pay

## 2019-03-09 ENCOUNTER — Ambulatory Visit (INDEPENDENT_AMBULATORY_CARE_PROVIDER_SITE_OTHER): Payer: Managed Care, Other (non HMO) | Admitting: Internal Medicine

## 2019-03-09 DIAGNOSIS — E538 Deficiency of other specified B group vitamins: Secondary | ICD-10-CM | POA: Diagnosis not present

## 2019-03-09 DIAGNOSIS — E559 Vitamin D deficiency, unspecified: Secondary | ICD-10-CM | POA: Diagnosis not present

## 2019-03-09 DIAGNOSIS — R197 Diarrhea, unspecified: Secondary | ICD-10-CM

## 2019-03-09 DIAGNOSIS — R079 Chest pain, unspecified: Secondary | ICD-10-CM | POA: Diagnosis not present

## 2019-03-09 DIAGNOSIS — D649 Anemia, unspecified: Secondary | ICD-10-CM

## 2019-03-09 NOTE — Progress Notes (Signed)
Subjective:    Patient ID: Leslie Rice, female    DOB: 07/15/1965, 53 y.o.   MRN: [id number]  DOS:  03/09/2019 Type of visit - description: Virtual Visit via Video Note  I connected with@   by a video enabled telemedicine application and verified that I am speaking with the correct person using two identifiers.   THIS ENCOUNTER IS A VIRTUAL VISIT DUE TO COVID-19 - PATIENT WAS NOT SEEN IN THE OFFICE. PATIENT HAS CONSENTED TO VIRTUAL VISIT / TELEMEDICINE VISIT   Location of patient: home  Location of provider: office  I discussed the limitations of evaluation and management by telemedicine and the availability of in person appointments. The patient expressed understanding and agreed to proceed.  History of Present Illness: Follow-up Since the last office visit, went to see psychiatry, doing better. Anemia: On iron, had to miss her GI appointment due to a death in the family. Saw gynecology for heavy bleeding, had a uterine ablation and genetic testing. Genetic testing showed mutation possibly Lynch syndrome, saw a geneticist. HTN: No recent ambulatory BPs. Chest pain: Saw cardiology, work-up reviewed.    BP Readings from Last 3 Encounters:  12/17/18 118/90  12/09/18 (!) 149/92  03/25/18 126/80    Review of Systems Has developed diarrhea for 2 weeks, on and off, watery. Associated with some lower abdominal cramps. Denies fever chills.  No recent antibiotics No blood in the stools OTC antidiarrheals helping.  Past Medical History:  Diagnosis Date  . ALLERGIC RHINITIS   . Chronic pain syndrome    thoracic back pain, seeing Dr. Ethelene Hal  . DEPRESSION   . DVT (deep venous thrombosis) (HCC)   . ELEVATED BLOOD PRESSURE   . GERD   . HYPERLIPIDEMIA   . INSOMNIA   . Irritable bowel syndrome   . MIGRAINE HEADACHE   . OSA (obstructive sleep apnea)    not on Cpap @ present 11-2013  . Primary hypercoagulable state (HCC)   . Pulmonary emboli [facility])     Past Surgical  History:  Procedure Laterality Date  . c section     x 2   . CARPAL TUNNEL RELEASE     B  . HYSTEROSCOPY W/ ENDOMETRIAL ABLATION  01/16/2019  . NASAL SINUS SURGERY      Social History   Socioeconomic History  . Marital status: Single    Spouse name: Not on file  . Number of children: 2  . Years of education: Not on file  . Highest education level: Not on file  Occupational History  . 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 Financial resource strain: Not on file  . Food insecurity    Worry: Not on file    Inability: Not on file  . Transportation needs    Medical: Not on file    Non-medical: Not on file  Tobacco Use  . Smoking status: Never Smoker  . Smokeless tobacco: Never Used  Substance and Sexual Activity  . Alcohol use: Yes    Comment: occ  . Drug use: No  . Sexual activity: Not Currently    Birth control/protection: I.U.D.  Lifestyle  . Physical activity    Days per week: Not on file    Minutes per session: Not on file  . Stress: Not on file  Relationships  . Social Musician on phone: Not on file    Gets together: Not on file    Attends religious service: Not on file  Active member of club or organization: Not on file    Attends meetings of clubs or organizations: Not on file    Relationship status: Not on file  . Intimate partner violence    Fear of current or ex partner: Not on file    Emotionally abused: Not on file    Physically abused: Not on file    Forced sexual activity: Not on file  Other Topics Concern  . Not on file  Social History Narrative   1 daughter live w/ her    One graduated from [employer], lives in [place] as of 03/09/2019      Reactions   Ciprofloxacin    REACTION: vomiting   Codeine    REACTION: vomiting   Sulfonamide Derivatives    hives      Medication List       Accurate as of March 09, 2019  3:37 PM. If you have any questions, ask your nurse or doctor.         ALPRAZolam 0.25 MG tablet Commonly known as: Xanax Take 1 tablet (0.25 mg total) by mouth 3 (three) times daily as needed for anxiety.   FLUoxetine 20 MG capsule Commonly known as: PROzac Take 1 capsule (20 mg total) by mouth daily.   HYDROcodone-acetaminophen 10-325 MG tablet Commonly known as: NORCO Take 1 tablet by mouth every 6 (six) hours as needed for moderate pain.   hydrOXYzine 25 MG tablet Commonly known as: ATARAX/VISTARIL Take 1 tablet (25 mg total) by mouth 3 (three) times daily as needed.   Latuda 20 MG Tabs tablet Generic drug: lurasidone Take one tablet daily with 350 calories.   lurasidone 40 MG Tabs tablet Commonly known as: Latuda Take 1 tablet (40 mg total) by mouth daily with breakfast.   metoprolol tartrate 25 MG tablet Commonly known as: LOPRESSOR Take 1 tablet (25 mg total) by mouth 2 (two) times daily.   promethazine 25 MG tablet Commonly known as: PHENERGAN Take 1 tablet (25 mg total) by mouth every 6 (six) hours as needed for nausea or vomiting.   rivaroxaban 20 MG Tabs tablet Commonly known as: Xarelto Take 1 tablet (20 mg total) by mouth daily with supper.   SF 5000 Plus 1.1 % Crea dental cream Generic drug: sodium fluoride   SUMAtriptan 50 MG tablet Commonly known as: IMITREX One tablet by mouth at start of migraine.  May repeat in 2 hours if no improvement x 1 dose.           Objective:   Physical Exam There were no vitals taken for this visit.    This is a virtual video visit, she is alert oriented x3 and in no apparent distress  Assessment     Assessment Primary hypercoagulable state, change Coumadin to Xarelto 05-2014 -PE 2002 while on HRT, saw Dr Beryle Beams ;+ FH, testing on her was (-);  rx coumadin since 2002 for life  -superficial phlebitis 11-2014 - PE DVT 03/2018 (non compliant w/ xarelto Elevated BP Hyperlipidemia-- Pravachol, simvastatin caused aches  Morbidly obese Depression, anxiety,  insomnia:  since divorce  2007 Lamictal helped according to my notes however she self dc it ~ 08-2014  -on xanax rx by pcp Migraine headaches: sees neuro as of 12/2018 .on imitrex per pcp.  Used to take BB, Topamax IBS  OSA : + test before ([facility]),repeat sleep study + 2017,   Vit B12 and Vit D def-- poor compliance w/  supplements Chronic pain syndrome ---hydrocodone Rx per Dr. Ethelene Halamos Chest pain: Saw cards 12/2018, echo: LVH, nuclear stress test negative. Birth Control---none as off 07/2018  PLAN Follow-up from last visit, multiple issues discussed today HTN: Currently on metoprolol, no ambulatory BPs, encourage to check ambulatory BPs Hyperlipidemia: Last LDL satisfactory at 90.  (She is now on Latuda, it may increase cholesterol, recheck in few months) Depression, anxiety, insomnia: Since the last visit saw psychiatry, added Latuda and hydroxyzine for insomnia: Feeling better B12 and vitamin D deficiency: Good compliance with supplements, checking labs Chest pain: Saw cardiology, echo: LVH, nuclear stress test negative.  Symptoms essentially resolved. Anemia: Last hemoglobin 9.1 in the context of heavy periods, has seen gynecology, status post a uterine ablation, they are planning a hysterectomy, she is still bleeding.  On iron supplements. We will recheck CBC, iron, ferritin. Due to anemia was also referred to GI but that this is pending.  In addition she has genetic evidence of possibly Lynch syndrome which puts her at higher risk of colon polyps and cancer.  She plans to reschedule GI visit soon. Diarrhea: For 2 weeks, no red flag symptoms, call if not gradually improving RTC for CBC, iron, ferritin, vitamins in few days Next visit with me 4 months     I discussed the assessment and treatment plan with the patient. The patient was provided an opportunity to ask questions and all were answered. The patient agreed with the plan and demonstrated an understanding of the instructions.   The patient was advised to  call back or seek an in-person evaluation if the symptoms worsen or if the condition fails to improve as anticipated.

## 2019-03-10 ENCOUNTER — Ambulatory Visit: Payer: Managed Care, Other (non HMO) | Admitting: Internal Medicine

## 2019-03-10 NOTE — Assessment & Plan Note (Signed)
Follow-up from last visit, multiple issues discussed today HTN: Currently on metoprolol, no ambulatory BPs, encourage to check ambulatory BPs Hyperlipidemia: Last LDL satisfactory at 90.  (She is now on Latuda, it may increase cholesterol, recheck in few months) Depression, anxiety, insomnia: Since the last visit saw psychiatry, added Latuda and hydroxyzine for insomnia: Feeling better B12 and vitamin D deficiency: Good compliance with supplements, checking labs Chest pain: Saw cardiology, echo: LVH, nuclear stress test negative.  Symptoms essentially resolved. Anemia: Last hemoglobin 9.1 in the context of heavy periods, has seen gynecology, status post a uterine ablation, they are planning a hysterectomy, she is still bleeding.  On iron supplements. We will recheck CBC, iron, ferritin. Due to anemia was also referred to GI but that this is pending.  In addition she has genetic evidence of possibly Lynch syndrome which puts her at higher risk of colon polyps and cancer.  She plans to reschedule GI visit soon. Diarrhea: For 2 weeks, no red flag symptoms, call if not gradually improving RTC for CBC, iron, ferritin, vitamins in few days Next visit with me 4 months

## 2019-03-11 ENCOUNTER — Other Ambulatory Visit: Payer: Self-pay

## 2019-03-11 ENCOUNTER — Other Ambulatory Visit (INDEPENDENT_AMBULATORY_CARE_PROVIDER_SITE_OTHER): Payer: Managed Care, Other (non HMO)

## 2019-03-11 DIAGNOSIS — E559 Vitamin D deficiency, unspecified: Secondary | ICD-10-CM | POA: Diagnosis not present

## 2019-03-11 DIAGNOSIS — E538 Deficiency of other specified B group vitamins: Secondary | ICD-10-CM | POA: Diagnosis not present

## 2019-03-11 DIAGNOSIS — D649 Anemia, unspecified: Secondary | ICD-10-CM

## 2019-03-11 LAB — CBC WITH DIFFERENTIAL/PLATELET
Basophils Absolute: 0 10*3/uL (ref 0.0–0.1)
Basophils Relative: 0.4 % (ref 0.0–3.0)
Eosinophils Absolute: 0.1 10*3/uL (ref 0.0–0.7)
Eosinophils Relative: 2.4 % (ref 0.0–5.0)
HCT: 40.8 % (ref 36.0–46.0)
Hemoglobin: 14.2 g/dL (ref 12.0–15.0)
Lymphocytes Relative: 36.4 % (ref 12.0–46.0)
Lymphs Abs: 1.8 10*3/uL (ref 0.7–4.0)
MCHC: 34.7 g/dL (ref 30.0–36.0)
MCV: 86.3 fl (ref 78.0–100.0)
Monocytes Absolute: 0.2 10*3/uL (ref 0.1–1.0)
Monocytes Relative: 4.8 % (ref 3.0–12.0)
Neutro Abs: 2.8 10*3/uL (ref 1.4–7.7)
Neutrophils Relative %: 56 % (ref 43.0–77.0)
Platelets: 187 10*3/uL (ref 150.0–400.0)
RBC: 4.73 Mil/uL (ref 3.87–5.11)
RDW: 16.1 % — ABNORMAL HIGH (ref 11.5–15.5)
WBC: 5 10*3/uL (ref 4.0–10.5)

## 2019-03-11 LAB — IRON: Iron: 67 ug/dL (ref 42–145)

## 2019-03-11 LAB — VITAMIN D 25 HYDROXY (VIT D DEFICIENCY, FRACTURES): VITD: 27.03 ng/mL — ABNORMAL LOW (ref 30.00–100.00)

## 2019-03-11 LAB — FERRITIN: Ferritin: 35.7 ng/mL (ref 10.0–291.0)

## 2019-03-11 LAB — VITAMIN B12: Vitamin B-12: >1500 pg/mL — ABNORMAL HIGH (ref 211–911)

## 2019-03-13 MED ORDER — VITAMIN D (ERGOCALCIFEROL) 1.25 MG (50000 UNIT) PO CAPS: 50000.0000 [IU] | ORAL_CAPSULE | ORAL | 0 refills | Status: DC

## 2019-03-13 NOTE — Addendum Note (Signed)
Addended byDamita Dunnings D on: 03/13/2019 12:43 PM   Modules accepted: Orders

## 2019-03-18 ENCOUNTER — Encounter: Payer: Self-pay | Admitting: Cardiology

## 2019-03-18 ENCOUNTER — Ambulatory Visit (INDEPENDENT_AMBULATORY_CARE_PROVIDER_SITE_OTHER): Payer: Managed Care, Other (non HMO) | Admitting: Cardiology

## 2019-03-18 ENCOUNTER — Other Ambulatory Visit: Payer: Self-pay

## 2019-03-18 VITALS — BP 136/96 | HR 60 | Ht 63.0 in | Wt 239.0 lb

## 2019-03-18 DIAGNOSIS — E782 Mixed hyperlipidemia: Secondary | ICD-10-CM

## 2019-03-18 DIAGNOSIS — I10 Essential (primary) hypertension: Secondary | ICD-10-CM | POA: Diagnosis not present

## 2019-03-18 DIAGNOSIS — I77810 Thoracic aortic ectasia: Secondary | ICD-10-CM | POA: Diagnosis not present

## 2019-03-18 MED ORDER — CARVEDILOL 6.25 MG PO TABS: 6.2500 mg | ORAL_TABLET | Freq: Two times a day (BID) | ORAL | 1 refills | Status: DC

## 2019-03-18 NOTE — Progress Notes (Signed)
Cardiology Office Note:    Date:  03/18/2019   ID:  Leslie Rice, DOB 12-Aug-1965, MRN [id number]  PCP:  Leslie Branch, MD  Cardiologist:  Leslie Salines, DO  Electrophysiologist:  None   Referring MD: Leslie Branch, MD   Chief Complaint  Patient presents with  . Follow-up    History of Present Illness:    Leslie Rice is a 53 y.o. female with a hx of hypertension, hyperlipidemia, OSA on CPAP at home, history of recurrent PE/DVT now on Xarelto presents for follow-up visit.  I did see the patient initially on December 17, 2018 at which time she complained of chest pain with shortness of breath. The conclusion of her visit was concern of coronary artery disease I therefore order a nuclear stress test as well as a echocardiogram.  I did not change her antihypertensive medication as her blood pressure was acceptable during her earlier visit.  Was also continued on her statin medication.  In the interim she has been able to undergo her nuclear stress test and her echocardiogram today she is here for a follow-up visit.  She offers no complaints at this time.    Past Medical History:  Diagnosis Date  . ALLERGIC RHINITIS   . Chronic pain syndrome    thoracic back pain, seeing Dr. Nelva Bush  . DEPRESSION   . DVT (deep venous thrombosis) ([place])   . ELEVATED BLOOD PRESSURE   . GERD   . HYPERLIPIDEMIA   . INSOMNIA   . Irritable bowel syndrome   . MIGRAINE HEADACHE   . OSA (obstructive sleep apnea)    not on Cpap @ present 11-2013  . Primary hypercoagulable state ([place])   . Pulmonary emboli [facility])     Past Surgical History:  Procedure Laterality Date  . c section     x 2   . CARPAL TUNNEL RELEASE     B  . HYSTEROSCOPY W/ ENDOMETRIAL ABLATION  01/16/2019  . NASAL SINUS SURGERY      Current Medications: Current Meds  Medication Sig  . ALPRAZolam (XANAX) 0.25 MG tablet Take 1 tablet (0.25 mg total) by mouth 3 (three) times daily as needed for anxiety.  . ferrous sulfate 325 (65 FE) MG  tablet Take by mouth daily.  Marland Kitchen FLUoxetine (PROZAC) 20 MG capsule Take 1 capsule (20 mg total) by mouth daily.  Marland Kitchen HYDROcodone-acetaminophen (NORCO) 10-325 MG per tablet Take 1 tablet by mouth every 6 (six) hours as needed for moderate pain.   . hydrOXYzine (ATARAX/VISTARIL) 25 MG tablet Take 1 tablet (25 mg total) by mouth 3 (three) times daily as needed.  . lurasidone (LATUDA) 40 MG TABS tablet Take 1 tablet (40 mg total) by mouth daily with breakfast.  . metroNIDAZOLE (FLAGYL) 500 MG tablet Take 500 mg by mouth 2 (two) times daily.  Marland Kitchen NARCAN 4 MG/0.1ML LIQD nasal spray kit U Q 3 MIN UNTIL PATIENT AWAKES OR EMS ARRIVES  . promethazine (PHENERGAN) 25 MG tablet Take 1 tablet (25 mg total) by mouth every 6 (six) hours as needed for nausea or vomiting.  . rivaroxaban (XARELTO) 20 MG TABS tablet Take 1 tablet (20 mg total) by mouth daily with supper.  . SF 5000 PLUS 1.1 % CREA dental cream   . SUMAtriptan (IMITREX) 50 MG tablet One tablet by mouth at start of migraine.  May repeat in 2 hours if no improvement x 1 dose.  . Vitamin D, Ergocalciferol, (DRISDOL) 1.25 MG (50000 UT) CAPS capsule Take  1 capsule (50,000 Units total) by mouth every 7 (seven) days.  . [DISCONTINUED] metoprolol tartrate (LOPRESSOR) 25 MG tablet Take 1 tablet (25 mg total) by mouth 2 (two) times daily.     Allergies:   Ciprofloxacin, Codeine, and Sulfonamide derivatives   Social History   Socioeconomic History  . Marital status: Single    Spouse name: Not on file  . Number of children: 2  . Years of education: Not on file  . Highest education level: Not on file  Occupational History  . 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 Financial resource strain: Not on file  . Food insecurity    Worry: Not on file    Inability: Not on file  . Transportation needs    Medical: Not on file    Non-medical: Not on file  Tobacco Use  . Smoking status: Never Smoker  . Smokeless tobacco: Never Used  Substance and Sexual  Activity  . Alcohol use: Yes    Comment: occ  . Drug use: No  . Sexual activity: Not Currently    Birth control/protection: I.U.D.  Lifestyle  . Physical activity    Days per week: Not on file    Minutes per session: Not on file  . Stress: Not on file  Relationships  . Social Herbalist on phone: Not on file    Gets together: Not on file    Attends religious service: Not on file    Active member of club or organization: Not on file    Attends meetings of clubs or organizations: Not on file    Relationship status: Not on file  Other Topics Concern  . Not on file  Social History Narrative   1 daughter live w/ her    One graduated from [employer], lives in [place]         Family History: The patient's family history includes CAD in her father; Leslie cancer in her sister and another family member; Diabetes in her father; Lung cancer in her father; Thyroid cancer in her sister. There is no history of Breast cancer or Stroke.  ROS:   Review of Systems  Constitution: Negative for decreased appetite, fever and weight gain.  HENT: Negative for congestion, ear discharge, hoarse voice and sore throat.   Eyes: Negative for discharge, redness, vision loss in right eye and visual halos.  Cardiovascular: Negative for chest pain, dyspnea on exertion, leg swelling, orthopnea and palpitations.  Respiratory: Negative for cough, hemoptysis, shortness of breath and snoring.   Endocrine: Negative for heat intolerance and polyphagia.  Hematologic/Lymphatic: Negative for bleeding problem. Does not bruise/bleed easily.  Skin: Negative for flushing, nail changes, rash and suspicious lesions.  Musculoskeletal: Negative for arthritis, joint pain, muscle cramps, myalgias, neck pain and stiffness.  Gastrointestinal: Negative for abdominal pain, bowel incontinence, diarrhea and excessive appetite.  Genitourinary: Negative for decreased libido, genital sores and incomplete emptying.  Neurological:  Negative for brief paralysis, focal weakness, headaches and loss of balance.  Psychiatric/Behavioral: Negative for altered mental status, depression and suicidal ideas.  Allergic/Immunologic: Negative for HIV exposure and persistent infections.    EKGs/Labs/Other Studies Reviewed:    The following studies were reviewed today:   EKG:  The ekg ordered today demonstrates   Thoracic echocardiogram IMPRESSIONS 12/26/2018  1. Left ventricular ejection fraction, by visual estimation, is 60 to 65%. The left ventricle has normal function. Normal left ventricular size. Left ventricular septal wall thickness was mildly increased. There is  no left ventricular hypertrophy.  2. Global right ventricle has normal systolic function.The right ventricular size is normal. No increase in right ventricular wall thickness.  3. Left atrial size was normal.  4. Right atrial size was normal.  5. The mitral valve is normal in structure. No evidence of mitral valve regurgitation. No evidence of mitral stenosis.  6. The tricuspid valve is normal in structure. Tricuspid valve regurgitation was not visualized by color flow Doppler.  7. The aortic valve is normal in structure. Aortic valve regurgitation was not visualized by color flow Doppler. Structurally normal aortic valve, with no evidence of sclerosis or stenosis.  8. The pulmonic valve was normal in structure. Pulmonic valve regurgitation is not visualized by color flow Doppler.  9. There is mild dilatation of the aortic root measuring 38 mm. 10. The inferior vena cava is normal in size with greater than 50% respiratory variability, suggesting right atrial pressure of 3 mmHg.   Pharmacologic nuclear stress test  Nuclear stress EF: 64%. No wall motion abnormalities  There was no ST segment deviation noted during stress.  This is a low risk study. No perfusion defect suggestive of ischemia or infarction present.  The study is normal.    Recent Labs:  12/09/2018: ALT 8; BUN 7; Creatinine, Ser 0.71; Potassium 4.2; Sodium 142; TSH 1.07 03/11/2019: Hemoglobin 14.2; Platelets 187.0  Recent Lipid Panel    Component Value Date/Time   CHOL 207 (H) 12/09/2018 0852   TRIG 342.0 (H) 12/09/2018 0852   HDL 36.80 (L) 12/09/2018 0852   CHOLHDL 6 12/09/2018 0852   VLDL 68.4 (H) 12/09/2018 0852   LDLCALC 123 (H) 03/18/2018 0347   LDLDIRECT 90.0 12/09/2018 0852    Physical Exam:    VS:  BP (!) 136/96 (BP Location: Right Arm, Patient Position: Sitting, Cuff Size: Normal)   Pulse 60   Ht '5\' 3"'$  (1.6 m)   Wt 239 lb (108.4 kg)   SpO2 98%   BMI 42.34 kg/m     Wt Readings from Last 3 Encounters:  03/18/19 239 lb (108.4 kg)  12/26/18 236 lb (107 kg)  12/17/18 236 lb (107 kg)     GEN: Well nourished, well developed in no acute distress HEENT: Normal NECK: No JVD; No carotid bruits LYMPHATICS: No lymphadenopathy CARDIAC: S1S2 noted,RRR, no murmurs, rubs, gallops RESPIRATORY:  Clear to auscultation without rales, wheezing or rhonchi  ABDOMEN: Soft, non-tender, non-distended, +bowel sounds, no guarding. EXTREMITIES: No edema, No cyanosis, no clubbing MUSCULOSKELETAL:  No edema; No deformity  SKIN: Warm and dry NEUROLOGIC:  Alert and oriented x 3, non-focal PSYCHIATRIC:  Normal affect, good insight  ASSESSMENT:    1. Essential hypertension   2. Mixed hyperlipidemia   3. Mild ascending aorta dilation (HCC)    PLAN:    1.  Her blood pressure is not well controlled on her Lopressor 50 mg twice a day therefore I am going to stop this medication and start her on carvedilol 6.25 mg twice daily.  Also positive educated patient on getting a new blood pressure cuff and hopefully she can take her blood pressure daily that way we can be able to understand her response to her antihypertensive medication.  She also decrease salt intake.  2.  Hyperlipidemia-she will remain on her diet modification strategy.  Her lipid profile is still pending at urged  patient to come get this testing done fasting on another day.  3.  I did explain her results including her mildly dilated ascending aorta, encouraged  the patient that once we can hopefully get her blood pressure under control and achieve low cholesterol and LDL we may be able to hold on any progression of this.  The patient is in agreement with the above plan. The patient left the office in stable condition.  The patient will follow up in 3 months or sooner if needed.   Medication Adjustments/Labs and Tests Ordered: Current medicines are reviewed at length with the patient today.  Concerns regarding medicines are outlined above.  No orders of the defined types were placed in this encounter.  Meds ordered this encounter  Medications  . carvedilol (COREG) 6.25 MG tablet    Sig: Take 1 tablet (6.25 mg total) by mouth 2 (two) times daily.    Dispense:  180 tablet    Refill:  1    Patient Instructions  Medication Instructions:  Your physician has recommended you make the following change in your medication:   STOP: Metoprolol   START: Coreg(carvedilol) 6.25 mg Take 1 tab twice daily  *If you need a refill on your cardiac medications before your next appointment, please call your pharmacy*  Lab Work: None If you have labs (blood work) drawn today and your tests are completely normal, you will receive your results only by: Marland Kitchen MyChart Message (if you have MyChart) OR . A paper copy in the mail If you have any lab test that is abnormal or we need to change your treatment, we will call you to review the results.  Testing/Procedures: NOne  Follow-Up: At [facility], you and your health needs are our priority.  As part of our continuing mission to provide you with exceptional heart care, we have created designated Provider Care Teams.  These Care Teams include your primary Cardiologist (physician) and Advanced Practice Providers (APPs -  Physician Assistants and Nurse Practitioners)  who all work together to provide you with the care you need, when you need it.  Your next appointment:   3 month(s)  The format for your next appointment:   In Person  Provider:   Berniece Salines, DO  Other Instructions      Adopting a Healthy Lifestyle.  Know what a healthy weight is for you (roughly BMI <25) and aim to maintain this   Aim for 7+ servings of fruits and vegetables daily   65-80+ fluid ounces of water or unsweet tea for healthy kidneys   Limit to max 1 drink of alcohol per day; avoid smoking/tobacco   Limit animal fats in diet for cholesterol and heart health - choose grass fed whenever available   Avoid highly processed foods, and foods high in saturated/trans fats   Aim for low stress - take time to unwind and care for your mental health   Aim for 150 min of moderate intensity exercise weekly for heart health, and weights twice weekly for bone health   Aim for 7-9 hours of sleep daily   When it comes to diets, agreement about the perfect plan isnt easy to find, even among the experts. Experts at the [facility] developed an idea known as the Healthy Eating Plate. Just imagine a plate divided into logical, healthy portions.   The emphasis is on diet quality:   Load up on vegetables and fruits - one-half of your plate: Aim for color and variety, and remember that potatoes dont count.   Go for whole grains - one-quarter of your plate: Whole wheat, barley, wheat berries, quinoa, oats, brown rice,  and foods made with them. If you want pasta, go with whole wheat pasta.   Protein power - one-quarter of your plate: Fish, chicken, beans, and nuts are all healthy, versatile protein sources. Limit red meat.   The diet, however, does go beyond the plate, offering a few other suggestions.   Use healthy plant oils, such as olive, canola, soy, corn, sunflower and peanut. Check the labels, and avoid partially hydrogenated oil, which have unhealthy  trans fats.   If youre thirsty, drink water. Coffee and tea are good in moderation, but skip sugary drinks and limit milk and dairy products to one or two daily servings.   The type of carbohydrate in the diet is more important than the amount. Some sources of carbohydrates, such as vegetables, fruits, whole grains, and beans-are healthier than others.   Finally, stay active  Signed, Leslie Salines, DO  03/18/2019 4:46 PM    [facility]

## 2019-03-18 NOTE — Patient Instructions (Signed)
Medication Instructions:  Your physician has recommended you make the following change in your medication:   STOP: Metoprolol   START: Coreg(carvedilol) 6.25 mg Take 1 tab twice daily  *If you need a refill on your cardiac medications before your next appointment, please call your pharmacy*  Lab Work: None If you have labs (blood work) drawn today and your tests are completely normal, you will receive your results only by: Marland Kitchen MyChart Message (if you have MyChart) OR . A paper copy in the mail If you have any lab test that is abnormal or we need to change your treatment, we will call you to review the results.  Testing/Procedures: NOne  Follow-Up: At [facility], you and your health needs are our priority.  As part of our continuing mission to provide you with exceptional heart care, we have created designated Provider Care Teams.  These Care Teams include your primary Cardiologist (physician) and Advanced Practice Providers (APPs -  Physician Assistants and Nurse Practitioners) who all work together to provide you with the care you need, when you need it.  Your next appointment:   3 month(s)  The format for your next appointment:   In Person  Provider:   Berniece Salines, DO  Other Instructions

## 2019-04-07 ENCOUNTER — Other Ambulatory Visit: Payer: Self-pay | Admitting: Adult Health

## 2019-04-09 ENCOUNTER — Telehealth: Payer: Self-pay | Admitting: Adult Health

## 2019-04-09 NOTE — Telephone Encounter (Signed)
Let's have her decrease back down to 20mg .

## 2019-04-09 NOTE — Telephone Encounter (Signed)
Leslie Rice called to report that since she has increased to [place] 40mg  she does not have ability to focus or concentrate.  Her work is suffering because of the lack of focus.  She doesn't even the focus to do a crossword puzzle.  Something needs to be changed.  Please call to discuss.

## 2019-04-09 NOTE — Telephone Encounter (Signed)
Left patient detailed voicemail and to call back if she has further questions.

## 2019-06-11 ENCOUNTER — Other Ambulatory Visit: Payer: Self-pay

## 2019-06-11 ENCOUNTER — Ambulatory Visit (INDEPENDENT_AMBULATORY_CARE_PROVIDER_SITE_OTHER): Payer: Managed Care, Other (non HMO) | Admitting: Cardiology

## 2019-06-11 ENCOUNTER — Encounter: Payer: Self-pay | Admitting: Cardiology

## 2019-06-11 DIAGNOSIS — I10 Essential (primary) hypertension: Secondary | ICD-10-CM

## 2019-06-11 NOTE — Patient Instructions (Signed)

## 2019-06-11 NOTE — Progress Notes (Signed)
Cardiology Office Note:    Date:  06/11/2019   ID:  TAUNI SANKS, DOB 1965-07-17, MRN [id number]  PCP:  Colon Branch, MD  Cardiologist:  Berniece Salines, DO  Electrophysiologist:  None   Referring MD: Colon Branch, MD   Follow up for hypertension  History of Present Illness:    Leslie Rice is a 54 y.o. female with a hx of hypertension, hyperlipidemia, OSA on CPAP at home, history of recurrent PE/DVT now on Xarelto presents resents for follow-up visit.  Last saw the patient on March 18, 2019 I changed her lopressor to Coreg 6.25 mg BID.    Today she is here for a follow up visit. She tells me that she has been feeling well and her blood pressure is improved. However she notes that she has been feeling a lot more forgetful. She tells me that she also started two other anti-psychotics.   Past Medical History:  Diagnosis Date  . *Depression 07/21/2008   Used to see a Social worker. Meds RF by previous PCP    . ALLERGIC RHINITIS   . Annual physical exam 02/29/2012  . B12 deficiency 07/21/2008   Qualifier: Diagnosis of  By: Nils Pyle CMA (AAMA), Mearl Latin    . Chronic pain syndrome    thoracic back pain, seeing Dr. Nelva Bush  . DEPRESSION   . DVT (deep venous thrombosis) ([place])   . Edema 07/21/2008   Qualifier: Diagnosis of  By: Nils Pyle CMA (AAMA), Mearl Latin    . ELEVATED BLOOD PRESSURE   . FATIGUE 07/21/2008   Qualifier: Diagnosis of  By: Nils Pyle CMA ([place]), Mearl Latin    . GERD   . HYPERLIPIDEMIA   . Hyperlipidemia 07/21/2008   Intolerant to simvastatin and Pravachol   . Hypersomnia with sleep apnea 10/20/2015  . INSOMNIA   . Insomnia 07/21/2008   Qualifier: Diagnosis of  By: Nils Pyle CMA (AAMA), Mearl Latin    . Irritable bowel syndrome   . MIGRAINE HEADACHE   . Migraine headache 07/21/2008   Menstrual related, used to take HRT, now unable to due to clots Used to see Dr Erling Cruz     . Morbid obesity due to excess calories ([place]) 10/20/2015  . OSA (obstructive sleep apnea)    not on Cpap @ present 11-2013  .  Primary hypercoagulable state ([place])   . Pulmonary emboli ([place])   . Pulmonary embolism on long-term anticoagulation therapy ([place]) 10/20/2015  . PULMONARY EMBOLISM, HX OF 2002 07/21/2008   Qualifier: Diagnosis of  By: Nils Pyle CMA ([place]), Mearl Latin    . Seasonal allergies 07/26/2015  . Sleep related headaches 10/20/2015  . Vitamin D deficiency 07/21/2008   Qualifier: Diagnosis of  By: Nils Pyle CMA Deborra Medina), Mearl Latin      Past Surgical History:  Procedure Laterality Date  . c section     x 2   . CARPAL TUNNEL RELEASE     B  . HYSTEROSCOPY W/ ENDOMETRIAL ABLATION  01/16/2019  . NASAL SINUS SURGERY      Current Medications: Current Meds  Medication Sig  . ALPRAZolam (XANAX) 0.25 MG tablet Take 1 tablet (0.25 mg total) by mouth 3 (three) times daily as needed for anxiety.  . carvedilol (COREG) 6.25 MG tablet Take 1 tablet (6.25 mg total) by mouth 2 (two) times daily.  . ferrous sulfate 325 (65 FE) MG tablet Take by mouth daily.  Marland Kitchen FLUoxetine (PROZAC) 20 MG capsule Take 1 capsule (20 mg total) by mouth daily.  Marland Kitchen HYDROcodone-acetaminophen (NORCO) 10-325 MG per tablet Take  1 tablet by mouth every 6 (six) hours as needed for moderate pain.   . hydrOXYzine (ATARAX/VISTARIL) 25 MG tablet Take 1 tablet (25 mg total) by mouth 3 (three) times daily as needed.  Marland Kitchen NARCAN 4 MG/0.1ML LIQD nasal spray kit U Q 3 MIN UNTIL PATIENT AWAKES OR EMS ARRIVES  . promethazine (PHENERGAN) 25 MG tablet Take 1 tablet (25 mg total) by mouth every 6 (six) hours as needed for nausea or vomiting.  . rivaroxaban (XARELTO) 20 MG TABS tablet Take 1 tablet (20 mg total) by mouth daily with supper.  . SF 5000 PLUS 1.1 % CREA dental cream   . SUMAtriptan (IMITREX) 50 MG tablet One tablet by mouth at start of migraine.  May repeat in 2 hours if no improvement x 1 dose.  . Vitamin D, Ergocalciferol, (DRISDOL) 1.25 MG (50000 UT) CAPS capsule Take 1 capsule (50,000 Units total) by mouth every 7 (seven) days.     Allergies:   Ciprofloxacin,  Codeine, and Sulfonamide derivatives   Social History   Socioeconomic History  . Marital status: Single    Spouse name: Not on file  . Number of children: 2  . Years of education: Not on file  . Highest education level: Not on file  Occupational History  . 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 Smoking status: Never Smoker  . Smokeless tobacco: Never Used  Substance and Sexual Activity  . Alcohol use: Yes    Comment: occ  . Drug use: No  . Sexual activity: Not Currently    Birth control/protection: I.U.D.  Other Topics Concern  . Not on file  Social History Narrative   1 daughter live w/ her    One graduated from [employer], lives in [place] Determinants of Health   Financial Resource Strain:   . Difficulty of Paying Living Expenses: Not on file  Food Insecurity:   . Worried About Charity fundraiser in the Last Year: Not on file  . Ran Out of Food in the Last Year: Not on file  Transportation Needs:   . Lack of Transportation (Medical): Not on file  . Lack of Transportation (Non-Medical): Not on file  Physical Activity:   . Days of Exercise per Week: Not on file  . Minutes of Exercise per Session: Not on file  Stress:   . Feeling of Stress : Not on file  Social Connections:   . Frequency of Communication with Friends and Family: Not on file  . Frequency of Social Gatherings with Friends and Family: Not on file  . Attends Religious Services: Not on file  . Active Member of Clubs or Organizations: Not on file  . Attends Archivist Meetings: Not on file  . Marital Status: Not on file     Family History: The patient's family history includes CAD in her father; Colon cancer in her sister and another family member; Diabetes in her father; Lung cancer in her father; Thyroid cancer in her sister. There is no history of Breast cancer or Stroke.  ROS:   Review of Systems  Constitution: Negative for decreased appetite, fever and weight gain.    HENT: Negative for congestion, ear discharge, hoarse voice and sore throat.   Eyes: Negative for discharge, redness, vision loss in right eye and visual halos.  Cardiovascular: Negative for chest pain, dyspnea on exertion, leg swelling, orthopnea and palpitations.  Respiratory: Negative for cough, hemoptysis, shortness of breath  and snoring.   Endocrine: Negative for heat intolerance and polyphagia.  Hematologic/Lymphatic: Negative for bleeding problem. Does not bruise/bleed easily.  Skin: Negative for flushing, nail changes, rash and suspicious lesions.  Musculoskeletal: Negative for arthritis, joint pain, muscle cramps, myalgias, neck pain and stiffness.  Gastrointestinal: Negative for abdominal pain, bowel incontinence, diarrhea and excessive appetite.  Genitourinary: Negative for decreased libido, genital sores and incomplete emptying.  Neurological: Negative for brief paralysis, focal weakness, headaches and loss of balance.  Psychiatric/Behavioral: Negative for altered mental status, depression and suicidal ideas.  Allergic/Immunologic: Negative for HIV exposure and persistent infections.    EKGs/Labs/Other Studies Reviewed:    The following studies were reviewed today:   EKG:   None today  2D echocardiogram IMPRESSIONS 12/26/2018 1. Left ventricular ejection fraction, by visual estimation, is 60 to 65%. The left ventricle has normal function. Normal left ventricular size. Left ventricular septal wall thickness was mildly increased. There is no left ventricular hypertrophy. 2. Global right ventricle has normal systolic function.The right ventricular size is normal. No increase in right ventricular wall thickness. 3. Left atrial size was normal. 4. Right atrial size was normal. 5. The mitral valve is normal in structure. No evidence of mitral valve regurgitation. No evidence of mitral stenosis. 6. The tricuspid valve is normal in structure. Tricuspid valve regurgitation was  not visualized by color flow Doppler. 7. The aortic valve is normal in structure. Aortic valve regurgitation was not visualized by color flow Doppler. Structurally normal aortic valve, with no evidence of sclerosis or stenosis. 8. The pulmonic valve was normal in structure. Pulmonic valve regurgitation is not visualized by color flow Doppler. 9. There is mild dilatation of the aortic root measuring 38 mm. 10. The inferior vena cava is normal in size with greater than 50% respiratory variability, suggesting right atrial pressure of 3 mmHg.   Pharmacologic nuclear stress test  Nuclear stress EF: 64%. No wall motion abnormalities  There was no ST segment deviation noted during stress.  This is a low risk study. No perfusion defect suggestive of ischemia or infarction present.  The study is normal.  Recent Labs: 12/09/2018: ALT 8; BUN 7; Creatinine, Ser 0.71; Potassium 4.2; Sodium 142; TSH 1.07 03/11/2019: Hemoglobin 14.2; Platelets 187.0  Recent Lipid Panel    Component Value Date/Time   CHOL 207 (H) 12/09/2018 0852   TRIG 342.0 (H) 12/09/2018 0852   HDL 36.80 (L) 12/09/2018 0852   CHOLHDL 6 12/09/2018 0852   VLDL 68.4 (H) 12/09/2018 0852   LDLCALC 123 (H) 03/18/2018 0347   LDLDIRECT 90.0 12/09/2018 0852    Physical Exam:    VS:  Ht '5\' 3"'$  (1.6 m)   Wt 238 lb (108 kg)   BMI 42.16 kg/m     Wt Readings from Last 3 Encounters:  06/11/19 238 lb (108 kg)  03/18/19 239 lb (108.4 kg)  12/26/18 236 lb (107 kg)     GEN: Well nourished, well developed in no acute distress HEENT: Normal NECK: No JVD; No carotid bruits LYMPHATICS: No lymphadenopathy CARDIAC: S1S2 noted,RRR, no murmurs, rubs, gallops RESPIRATORY:  Clear to auscultation without rales, wheezing or rhonchi  ABDOMEN: Soft, non-tender, non-distended, +bowel sounds, no guarding. EXTREMITIES: No edema, No cyanosis, no clubbing MUSCULOSKELETAL:  No deformity  SKIN: Warm and dry NEUROLOGIC:  Alert and oriented x 3,  non-focal PSYCHIATRIC:  Normal affect, good insight  ASSESSMENT:    No diagnosis found. PLAN:     1.  Blood pressure is below target.  For now  she will continue her current antihypertensive dose which include carvedilol 6.25 mg twice a day.  She will follow up with her PCP for blood pressure needs and see me once a year.  2.  For DVT continue patient on her Xarelto.  3.  Asked the patient to follow-up with her psychiatrist to see if they can also medications for her complaint of forgetfulness.  I doubt that this could be in the setting of from her beta-blocker.  We will let my office know if her antipsychotics is adjusted if she still is experiencing the symptoms.  4.  Obesity-the patient understands the need to lose weight with diet and exercise. We have discussed specific strategies for this.  The patient is in agreement with the above plan. The patient left the office in stable condition.  The patient will follow up in 1 year or sooner if needed.   Medication Adjustments/Labs and Tests Ordered: Current medicines are reviewed at length with the patient today.  Concerns regarding medicines are outlined above.  No orders of the defined types were placed in this encounter.  No orders of the defined types were placed in this encounter.   There are no Patient Instructions on file for this visit.   Adopting a Healthy Lifestyle.  Know what a healthy weight is for you (roughly BMI <25) and aim to maintain this   Aim for 7+ servings of fruits and vegetables daily   65-80+ fluid ounces of water or unsweet tea for healthy kidneys   Limit to max 1 drink of alcohol per day; avoid smoking/tobacco   Limit animal fats in diet for cholesterol and heart health - choose grass fed whenever available   Avoid highly processed foods, and foods high in saturated/trans fats   Aim for low stress - take time to unwind and care for your mental health   Aim for 150 min of moderate intensity  exercise weekly for heart health, and weights twice weekly for bone health   Aim for 7-9 hours of sleep daily   When it comes to diets, agreement about the perfect plan isnt easy to find, even among the experts. Experts at the [employer] developed an idea known as the Healthy Eating Plate. Just imagine a plate divided into logical, healthy portions.   The emphasis is on diet quality:   Load up on vegetables and fruits - one-half of your plate: Aim for color and variety, and remember that potatoes dont count.   Go for whole grains - one-quarter of your plate: Whole wheat, barley, wheat berries, quinoa, oats, brown rice, and foods made with them. If you want pasta, go with whole wheat pasta.   Protein power - one-quarter of your plate: Fish, chicken, beans, and nuts are all healthy, versatile protein sources. Limit red meat.   The diet, however, does go beyond the plate, offering a few other suggestions.   Use healthy plant oils, such as olive, canola, soy, corn, sunflower and peanut. Check the labels, and avoid partially hydrogenated oil, which have unhealthy trans fats.   If youre thirsty, drink water. Coffee and tea are good in moderation, but skip sugary drinks and limit milk and dairy products to one or two daily servings.   The type of carbohydrate in the diet is more important than the amount. Some sources of carbohydrates, such as vegetables, fruits, whole grains, and beans-are healthier than others.   Finally, stay active  Signed, Berniece Salines, DO  06/11/2019  1:13 PM    [facility]

## 2019-06-18 ENCOUNTER — Other Ambulatory Visit: Payer: Self-pay

## 2019-06-19 ENCOUNTER — Other Ambulatory Visit: Payer: Self-pay

## 2019-06-19 ENCOUNTER — Ambulatory Visit: Payer: Managed Care, Other (non HMO) | Admitting: Internal Medicine

## 2019-06-19 ENCOUNTER — Encounter: Payer: Self-pay | Admitting: Internal Medicine

## 2019-06-19 VITALS — BP 140/82 | HR 74 | Temp 96.6°F | Resp 16 | Ht 63.0 in | Wt 233.0 lb

## 2019-06-19 DIAGNOSIS — E538 Deficiency of other specified B group vitamins: Secondary | ICD-10-CM

## 2019-06-19 DIAGNOSIS — F32A Depression, unspecified: Secondary | ICD-10-CM

## 2019-06-19 DIAGNOSIS — D649 Anemia, unspecified: Secondary | ICD-10-CM

## 2019-06-19 DIAGNOSIS — F329 Major depressive disorder, single episode, unspecified: Secondary | ICD-10-CM

## 2019-06-19 DIAGNOSIS — E559 Vitamin D deficiency, unspecified: Secondary | ICD-10-CM

## 2019-06-19 DIAGNOSIS — I10 Essential (primary) hypertension: Secondary | ICD-10-CM

## 2019-06-19 DIAGNOSIS — F419 Anxiety disorder, unspecified: Secondary | ICD-10-CM

## 2019-06-19 NOTE — Progress Notes (Signed)
Subjective:    Patient ID: Leslie Rice, female    DOB: 13-Sep-1965, 54 y.o.   MRN: [id number]  DOS:  06/19/2019 Type of visit - description: Routine visit Him main concern is mental fogginess, decreased concentration.  This is gone on for a while, anxiety depression well controlled. Good compliance with CPAP. Medication list reviewed with the patient  Review of Systems DU B much improved after uterine ablation.  Past Medical History:  Diagnosis Date  . *Depression 07/21/2008   Used to see a Social worker. Meds RF by previous PCP    . ALLERGIC RHINITIS   . Annual physical exam 02/29/2012  . B12 deficiency 07/21/2008   Qualifier: Diagnosis of  By: Nils Pyle CMA (AAMA), Mearl Latin    . Chronic pain syndrome    thoracic back pain, seeing Dr. Nelva Bush  . DEPRESSION   . DVT (deep venous thrombosis) ([place])   . Edema 07/21/2008   Qualifier: Diagnosis of  By: Nils Pyle CMA (AAMA), Mearl Latin    . ELEVATED BLOOD PRESSURE   . FATIGUE 07/21/2008   Qualifier: Diagnosis of  By: Nils Pyle CMA ([place]), Mearl Latin    . GERD   . HYPERLIPIDEMIA   . Hyperlipidemia 07/21/2008   Intolerant to simvastatin and Pravachol   . Hypersomnia with sleep apnea 10/20/2015  . INSOMNIA   . Insomnia 07/21/2008   Qualifier: Diagnosis of  By: Nils Pyle CMA (AAMA), Mearl Latin    . Irritable bowel syndrome   . MIGRAINE HEADACHE   . Migraine headache 07/21/2008   Menstrual related, used to take HRT, now unable to due to clots Used to see Dr Erling Cruz     . Morbid obesity due to excess calories ([place]) 10/20/2015  . OSA (obstructive sleep apnea)    not on Cpap @ present 11-2013  . Primary hypercoagulable state ([place])   . Pulmonary emboli ([place])   . Pulmonary embolism on long-term anticoagulation therapy ([place]) 10/20/2015  . PULMONARY EMBOLISM, HX OF 2002 07/21/2008   Qualifier: Diagnosis of  By: Nils Pyle CMA ([place]), Mearl Latin    . Seasonal allergies 07/26/2015  . Sleep related headaches 10/20/2015  . Vitamin D deficiency 07/21/2008   Qualifier: Diagnosis of  By:  Nils Pyle CMA Deborra Medina), Mearl Latin      Past Surgical History:  Procedure Laterality Date  . c section     x 2   . CARPAL TUNNEL RELEASE     B  . HYSTEROSCOPY W/ ENDOMETRIAL ABLATION  01/16/2019  . NASAL SINUS SURGERY      Allergies as of 06/19/2019      Reactions   Ciprofloxacin    REACTION: vomiting   Codeine    REACTION: vomiting   Sulfonamide Derivatives    hives      Medication List       Accurate as of June 19, 2019 11:59 PM. If you have any questions, ask your nurse or doctor.        STOP taking these medications   ALPRAZolam 0.25 MG tablet Commonly known as: Xanax Stopped by: Kathlene November, MD   ferrous sulfate 325 (65 FE) MG tablet Stopped by: Kathlene November, MD   hydrOXYzine 25 MG tablet Commonly known as: ATARAX/VISTARIL Stopped by: Kathlene November, MD   Narcan 4 MG/0.1ML Liqd nasal spray kit Generic drug: naloxone Stopped by: Kathlene November, MD     TAKE these medications   carvedilol 6.25 MG tablet Commonly known as: COREG Take 1 tablet (6.25 mg total) by mouth 2 (two) times daily.   FLUoxetine 20 MG  capsule Commonly known as: PROzac Take 1 capsule (20 mg total) by mouth daily.   HYDROcodone-acetaminophen 10-325 MG tablet Commonly known as: NORCO Take 1 tablet by mouth every 6 (six) hours as needed for moderate pain.   Latuda 20 MG Tabs tablet Generic drug: lurasidone Take 20 mg by mouth daily.   promethazine 25 MG tablet Commonly known as: PHENERGAN Take 1 tablet (25 mg total) by mouth every 6 (six) hours as needed for nausea or vomiting.   rivaroxaban 20 MG Tabs tablet Commonly known as: Xarelto Take 1 tablet (20 mg total) by mouth daily with supper.   SF 5000 Plus 1.1 % Crea dental cream Generic drug: sodium fluoride   SUMAtriptan 50 MG tablet Commonly known as: IMITREX One tablet by mouth at start of migraine.  May repeat in 2 hours if no improvement x 1 dose.   VITAMIN B 12 PO Take 1 capsule by mouth.   Vitamin D (Ergocalciferol) 1.25 MG (50000 UNIT)  Caps capsule Commonly known as: DRISDOL Take 1 capsule (50,000 Units total) by mouth every 7 (seven) days.   Vitamin D 50 MCG (2000 UT) Caps Take 1 capsule by mouth.          Objective:   Physical Exam BP 140/82 (BP Location: Right Arm, Patient Position: Sitting, Cuff Size: Large)   Pulse 74   Temp (!) 96.6 F (35.9 C) (Temporal)   Resp 16   Ht 5' 3"  (1.6 m)   Wt 233 lb (105.7 kg)   SpO2 97%   BMI 41.27 kg/m  General:   Well developed, NAD, BMI noted. HEENT:  Normocephalic . Face symmetric, atraumatic Lungs:  CTA B Normal respiratory effort, no intercostal retractions, no accessory muscle use. Heart: RRR,  no murmur.  Lower extremities: no pretibial edema bilaterally  Skin: Not pale. Not jaundice Neurologic:  alert & oriented X3.  Speech normal, gait appropriate for age and unassisted Psych--  Cognition and judgment appear intact.  Cooperative with normal attention span and concentration.  Behavior appropriate. No anxious or depressed appearing.      Assessment     Assessment HTN, mild. Primary hypercoagulable state, change Coumadin to Xarelto 05-2014 -PE 2002 while on HRT, saw Dr Beryle Beams ;+ FH, testing on her was (-);  rx coumadin since 2002 for life  -superficial phlebitis 11-2014 - PE DVT 03/2018 (non compliant w/ xarelto Hyperlipidemia-- Pravachol, simvastatin caused aches  Morbidly obese Depression, anxiety,  insomnia:  since divorce 2007 Lamictal helped according to my notes however she self dc it ~ 08-2014  -on xanax rx by pcp Migraine headaches: sees neuro as of 12/2018 .on imitrex per pcp.  Used to take BB, Topamax IBS  OSA : + test before ,,repeat sleep study + 2017,   Vit B12 and Vit D def-- poor compliance w/ supplements Chronic pain syndrome ---hydrocodone Rx per Dr. Nelva Bush Chest pain: Saw cards 12/2018, echo: LVH, nuclear stress test negative. Birth Control---none as off 07/2018  PLAN HTN: On carvedilol, well controlled.  No change  Depression, anxiety, insomnia: Currently on Latuda.  Main concern is lack of concentration, self d/c hydroxyzine and Xanax but that has not helped her fogginess/concentration.   Recommend to discuss with psychiatry, ADD?. OSA: Good CPAP compliance Vitamins deficiency: On OTC vitamin D- B 12, finishing ergocalciferol this week. Anemia: Due to DUB, vaginal bleeding significantly decreased since uterine ablation, last hemoglobin normal per chart review, thus will D/C iron supplements RTC CPX 5 months    This visit occurred  during the SARS-CoV-2 public health emergency.  Safety protocols were in place, including screening questions prior to the visit, additional usage of staff PPE, and extensive cleaning of exam room while observing appropriate contact time as indicated for disinfecting solutions.

## 2019-06-19 NOTE — Patient Instructions (Signed)
  GO TO THE FRONT DESK Come back for a physical in 6 months  , please make an appointment

## 2019-06-20 DIAGNOSIS — I10 Essential (primary) hypertension: Secondary | ICD-10-CM | POA: Insufficient documentation

## 2019-06-20 NOTE — Assessment & Plan Note (Signed)
HTN: On carvedilol, well controlled.  No change Depression, anxiety, insomnia: Currently on Latuda.  Main concern is lack of concentration, self d/c hydroxyzine and Xanax but that has not helped her fogginess/concentration.   Recommend to discuss with psychiatry, ADD?. OSA: Good CPAP compliance Vitamins deficiency: On OTC vitamin D- B 12, finishing ergocalciferol this week. Anemia: Due to DUB, vaginal bleeding significantly decreased since uterine ablation, last hemoglobin normal per chart review, thus will D/C iron supplements RTC CPX 5 months

## 2019-06-22 ENCOUNTER — Telehealth: Payer: Self-pay

## 2019-06-22 ENCOUNTER — Ambulatory Visit (INDEPENDENT_AMBULATORY_CARE_PROVIDER_SITE_OTHER): Payer: 59 | Admitting: Adult Health

## 2019-06-22 ENCOUNTER — Telehealth: Payer: Self-pay | Admitting: Adult Health

## 2019-06-22 ENCOUNTER — Encounter: Payer: Self-pay | Admitting: Adult Health

## 2019-06-22 DIAGNOSIS — F3181 Bipolar II disorder: Secondary | ICD-10-CM | POA: Diagnosis not present

## 2019-06-22 DIAGNOSIS — F411 Generalized anxiety disorder: Secondary | ICD-10-CM | POA: Diagnosis not present

## 2019-06-22 DIAGNOSIS — F331 Major depressive disorder, recurrent, moderate: Secondary | ICD-10-CM

## 2019-06-22 DIAGNOSIS — G4700 Insomnia, unspecified: Secondary | ICD-10-CM | POA: Diagnosis not present

## 2019-06-22 MED ORDER — ARMODAFINIL 150 MG PO TABS: 150.0000 mg | ORAL_TABLET | Freq: Every day | ORAL | 2 refills | Status: DC

## 2019-06-22 NOTE — Telephone Encounter (Signed)
Prior authorization submitted for ARMODAFINIL 150 MG tablets #30 through [employer] ID# [id number], effective 06/22/2019-06/21/2020, SE#[id number]   Submitted through cover my meds

## 2019-06-22 NOTE — Telephone Encounter (Signed)
Left patient message with information and also received a prior authorization request. Submitted through [place] today.

## 2019-06-22 NOTE — Progress Notes (Signed)
Leslie Rice [id number] 1965/09/09 54 y.o.  Virtual Visit via Telephone Note  I connected with pt on 06/22/19 at  9:20 AM EDT by telephone and verified that I am speaking with the correct person using two identifiers.   I discussed the limitations, risks, security and privacy concerns of performing an evaluation and management service by telephone and the availability of in person appointments. I also discussed with the patient that there may be a patient responsible charge related to this service. The patient expressed understanding and agreed to proceed.   I discussed the assessment and treatment plan with the patient. The patient was provided an opportunity to ask questions and all were answered. The patient agreed with the plan and demonstrated an understanding of the instructions.   The patient was advised to call back or seek an in-person evaluation if the symptoms worsen or if the condition fails to improve as anticipated.  I provided 30 minutes of non-face-to-face time during this encounter.  The patient was located at home.  The provider was located at [facility].   Leslie Gell, NP   Subjective:   Patient ID:  Leslie Rice is a 54 y.o. (DOB [birth year]) female.  Chief Complaint: No chief complaint on file.   HPI   Leslie Rice presents for follow-up of MDD, GAD, insomnia. BPD2.   Referred by PCP  Describes mood today as "ok". Pleasant. Mood symptoms - decreased depression, anxiety, and irritability. More anxious overall with "not getting work done". Feels like she is doing "ok". Having increased issues with focus and concentration. Cannot focus at work. Has a huge project at work going on and is struggling to get it done. Had a surgery on back in October and feels like she has not been "right" cognitively since then. Drinking coffee and Red Bull throughout the day. Had some thoughts of issues related to medications. Some thoughts of Latuda being part of  the problem. Saw PCP last week and ruled out any metabolic issues. Stable interest and motivation. Taking medications as prescribed.  Energy levels low. Walking three days a week. Works full-time in Engineer, technical sales.  Enjoys some usual interests and activities. Has 2 children 22 and 19. One daughter just graduated from [employer]. Other daughter at [employer] - home d/t Covid. Talking to daughters. Mostly staying home. Brother in [place] passed away 2023/03/01. Appetite adequate. Weight stable. Sleeps well most nights. Averages 8 to 9 hours. Using CPAP machine. Taking dog out during the day.  Focus and concentration difficulties. Completing tasks. Managing aspects of household. Work going well - "busy".   Denies SI or HI. Denies AH or VH.  Previously seen by: Leslie Rice   Previous medications: Zoloft, Wellbutrin, Lexapro, Effexor  Review of Systems:  Review of Systems  Musculoskeletal: Negative for gait problem.  Neurological: Negative for tremors.  Psychiatric/Behavioral:       Please refer to HPI    Medications: I have reviewed the patient's current medications.  Current Outpatient Medications  Medication Sig Dispense Refill  . carvedilol (COREG) 6.25 MG tablet Take 1 tablet (6.25 mg total) by mouth 2 (two) times daily. 180 tablet 1  . Cholecalciferol (VITAMIN D) 50 MCG (2000 UT) CAPS Take 1 capsule by mouth.    . Cyanocobalamin (VITAMIN B 12 PO) Take 1 capsule by mouth.    Marland Kitchen FLUoxetine (PROZAC) 20 MG capsule Take 1 capsule (20 mg total) by mouth daily. 90 capsule 1  . HYDROcodone-acetaminophen (NORCO) 10-325 MG per tablet  Take 1 tablet by mouth every 6 (six) hours as needed for moderate pain.     Marland Kitchen lurasidone (LATUDA) 20 MG TABS tablet Take 20 mg by mouth daily.    . promethazine (PHENERGAN) 25 MG tablet Take 1 tablet (25 mg total) by mouth every 6 (six) hours as needed for nausea or vomiting. 30 tablet 1  . rivaroxaban (XARELTO) 20 MG TABS tablet Take 1 tablet (20 mg total) by mouth daily  with supper. 90 tablet 1  . SF 5000 PLUS 1.1 % CREA dental cream     . SUMAtriptan (IMITREX) 50 MG tablet One tablet by mouth at start of migraine.  May repeat in 2 hours if no improvement x 1 dose. 10 tablet 2  . Vitamin D, Ergocalciferol, (DRISDOL) 1.25 MG (50000 UT) CAPS capsule Take 1 capsule (50,000 Units total) by mouth every 7 (seven) days. 12 capsule 0   No current facility-administered medications for this visit.    Medication Side Effects: None  Allergies:  Allergies  Allergen Reactions  . Ciprofloxacin     REACTION: vomiting  . Codeine     REACTION: vomiting  . Sulfonamide Derivatives     hives    Past Medical History:  Diagnosis Date  . *Depression 07/21/2008   Used to see a Veterinary surgeon. Meds RF by previous PCP    . ALLERGIC RHINITIS   . Annual physical exam 02/29/2012  . B12 deficiency 07/21/2008   Qualifier: Diagnosis of  By: Koleen Distance CMA (AAMA), Hulan Saas    . Chronic pain syndrome    thoracic back pain, seeing Leslie. Ethelene Rice  . DEPRESSION   . DVT (deep venous thrombosis) (HCC)   . Edema 07/21/2008   Qualifier: Diagnosis of  By: Koleen Distance CMA (AAMA), Hulan Saas    . ELEVATED BLOOD PRESSURE   . FATIGUE 07/21/2008   Qualifier: Diagnosis of  By: Koleen Distance CMA (AAMA), Hulan Saas    . GERD   . HYPERLIPIDEMIA   . Hyperlipidemia 07/21/2008   Intolerant to simvastatin and Pravachol   . Hypersomnia with sleep apnea 10/20/2015  . INSOMNIA   . Insomnia 07/21/2008   Qualifier: Diagnosis of  By: Koleen Distance CMA (AAMA), Hulan Saas    . Irritable bowel syndrome   . MIGRAINE HEADACHE   . Migraine headache 07/21/2008   Menstrual related, used to take HRT, now unable to due to clots Used to see Leslie Rice     . Morbid obesity due to excess calories (HCC) 10/20/2015  . OSA (obstructive sleep apnea)    not on Cpap @ present 11-2013  . Primary hypercoagulable state (HCC)   . Pulmonary emboli (HCC)   . Pulmonary embolism on long-term anticoagulation therapy ([facility]) 10/20/2015  . PULMONARY EMBOLISM, HX OF 2002  07/21/2008   Qualifier: Diagnosis of  By: Koleen Distance CMA (AAMA), Hulan Saas    . Seasonal allergies 07/26/2015  . Sleep related headaches 10/20/2015  . Vitamin D deficiency 07/21/2008   Qualifier: Diagnosis of  By: Koleen Distance CMA Duncan Dull), Hulan Saas      Family History  Problem Relation Age of Onset  . Colon cancer Other        M, aunt, nephew  . CAD Father        age? "silent MIs"  . Lung cancer Father   . Diabetes Father        F, sisters x2  . Thyroid cancer Sister   . Colon cancer Sister        dx age 63  . Breast cancer Neg Hx   .  Stroke Neg Hx     Social History   Socioeconomic History  . Marital status: Single    Spouse name: Not on file  . Number of children: 2  . Years of education: Not on file  . Highest education level: Not on file  Occupational History  . 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 Smoking status: Never Smoker  . Smokeless tobacco: Never Used  Substance and Sexual Activity  . Alcohol use: Yes    Comment: occ  . Drug use: No  . Sexual activity: Not Currently    Birth control/protection: I.U.D.  Other Topics Concern  . Not on file  Social History Narrative   1 daughter live w/ her    One graduated from [employer], lives in [place]       Social Determinants of Health   Financial Resource Strain:   . Difficulty of Paying Living Expenses:   Food Insecurity:   . Worried About Programme researcher, broadcasting/film/video in the Last Year:   . Barista in the Last Year:   Transportation Needs:   . Freight forwarder (Medical):   Marland Kitchen Lack of Transportation (Non-Medical):   Physical Activity:   . Days of Exercise per Week:   . Minutes of Exercise per Session:   Stress:   . Feeling of Stress :   Social Connections:   . Frequency of Communication with Friends and Family:   . Frequency of Social Gatherings with Friends and Family:   . Attends Religious Services:   . Active Member of Clubs or Organizations:   . Attends Banker Meetings:   Marland Kitchen Marital  Status:   Intimate Partner Violence:   . Fear of Current or Ex-Partner:   . Emotionally Abused:   Marland Kitchen Physically Abused:   . Sexually Abused:     Past Medical History, Surgical history, Social history, and Family history were reviewed and updated as appropriate.   Please see review of systems for further details on the patient's review from today.   Objective:   Physical Exam:  There were no vitals taken for this visit.  Physical Exam Neurological:     Mental Status: She is alert and oriented to person, place, and time.     Cranial Nerves: No dysarthria.  Psychiatric:        Attention and Perception: Attention and perception normal.        Mood and Affect: Mood normal.        Speech: Speech normal.        Behavior: Behavior is cooperative.        Thought Content: Thought content normal. Thought content is not paranoid or delusional. Thought content does not include homicidal or suicidal ideation. Thought content does not include homicidal or suicidal plan.        Cognition and Memory: Cognition and memory normal.        Judgment: Judgment normal.     Comments: Insight intact     Lab Review:     Component Value Date/Time   NA 142 12/09/2018 0852   K 4.2 12/09/2018 0852   CL 108 12/09/2018 0852   CO2 25 12/09/2018 0852   GLUCOSE 90 12/09/2018 0852   BUN 7 12/09/2018 0852   CREATININE 0.71 12/09/2018 0852   CALCIUM 8.8 12/09/2018 0852   PROT 6.7 12/09/2018 0852   ALBUMIN 4.2 12/09/2018 0852   AST 14 12/09/2018 0852   ALT 8 12/09/2018 3500  ALKPHOS 68 12/09/2018 0852   BILITOT 0.5 12/09/2018 0852   GFRNONAA >60 03/18/2018 0347   GFRAA >60 03/18/2018 0347       Component Value Date/Time   WBC 5.0 03/11/2019 1107   RBC 4.73 03/11/2019 1107   HGB 14.2 03/11/2019 1107   HCT 40.8 03/11/2019 1107   PLT 187.0 03/11/2019 1107   MCV 86.3 03/11/2019 1107   MCH 30.7 03/18/2018 0347   MCHC 34.7 03/11/2019 1107   RDW 16.1 (H) 03/11/2019 1107   LYMPHSABS 1.8 03/11/2019  1107   MONOABS 0.2 03/11/2019 1107   EOSABS 0.1 03/11/2019 1107   BASOSABS 0.0 03/11/2019 1107    No results found for: POCLITH, LITHIUM   No results found for: PHENYTOIN, PHENOBARB, VALPROATE, CBMZ   .res Assessment: Plan:    Plan:  1. Prozac 20mg  daily 2. Hydroxyzine 25mg  tablet - 1 to 3 at bedtime  3. Xanax 0.25mg  TID prn anxiety - is not taking 4. Latuda 20mg  daily  Follow up with Neurologist - Nuvigil or Provigil   RTC 4 weeks  Patient advised to contact office with any questions, adverse effects, or acute worsening in signs and symptoms.  Discussed potential benefits, risk, and side effects of benzodiazepines to include potential risk of tolerance and dependence, as well as possible drowsiness.  Advised patient not to drive if experiencing drowsiness and to take lowest possible effective dose to minimize risk of dependence and tolerance.  Discussed potential metabolic side effects associated with atypical antipsychotics, as well as potential risk for movement side effects. Advised pt to contact office if movement side effects occur.   There are no diagnoses linked to this encounter.  Please see After Visit Summary for patient specific instructions.  Future Appointments  Date Time Provider Department Center  12/23/2019  8:20 AM , MD [facility]    No orders of the defined types were placed in this encounter.     -------------------------------

## 2019-06-22 NOTE — Telephone Encounter (Signed)
Pt can't see neurologist for sometime and wants to know if you can help her with the medication that was suggested this morning.

## 2019-06-22 NOTE — Telephone Encounter (Signed)
Sleep apnea - wears a CPAP.

## 2019-06-22 NOTE — Telephone Encounter (Signed)
Prior approval received effective 06/22/2019-06/21/2020 with [employer], [place] PE#[id number]

## 2019-06-22 NOTE — Telephone Encounter (Signed)
Pls advise that I sent in Nuvigil 150mg  every morning. May need a PA.

## 2019-06-29 ENCOUNTER — Other Ambulatory Visit: Payer: Self-pay | Admitting: Internal Medicine

## 2019-06-30 MED ORDER — RIVAROXABAN 20 MG PO TABS: 20.0000 mg | ORAL_TABLET | Freq: Every day | ORAL | 2 refills | Status: DC

## 2019-06-30 NOTE — Addendum Note (Signed)
Addended byConrad [place] D on: 06/30/2019 08:28 AM   Modules accepted: Orders

## 2019-07-07 ENCOUNTER — Ambulatory Visit: Payer: Managed Care, Other (non HMO) | Admitting: Internal Medicine

## 2019-09-01 ENCOUNTER — Other Ambulatory Visit: Payer: Self-pay | Admitting: Adult Health

## 2019-09-01 DIAGNOSIS — F411 Generalized anxiety disorder: Secondary | ICD-10-CM

## 2019-09-02 ENCOUNTER — Telehealth (INDEPENDENT_AMBULATORY_CARE_PROVIDER_SITE_OTHER): Payer: 59 | Admitting: Adult Health

## 2019-09-02 ENCOUNTER — Encounter: Payer: Self-pay | Admitting: Adult Health

## 2019-09-02 ENCOUNTER — Telehealth: Payer: Self-pay | Admitting: Adult Health

## 2019-09-02 DIAGNOSIS — F331 Major depressive disorder, recurrent, moderate: Secondary | ICD-10-CM

## 2019-09-02 DIAGNOSIS — F411 Generalized anxiety disorder: Secondary | ICD-10-CM | POA: Diagnosis not present

## 2019-09-02 DIAGNOSIS — F3181 Bipolar II disorder: Secondary | ICD-10-CM

## 2019-09-02 DIAGNOSIS — G4700 Insomnia, unspecified: Secondary | ICD-10-CM | POA: Diagnosis not present

## 2019-09-02 MED ORDER — HYDROXYZINE HCL 25 MG PO TABS: 25.0000 mg | ORAL_TABLET | Freq: Three times a day (TID) | ORAL | 1 refills | Status: DC | PRN

## 2019-09-02 MED ORDER — FLUOXETINE HCL 20 MG PO CAPS: 20.0000 mg | ORAL_CAPSULE | Freq: Every day | ORAL | 1 refills | Status: DC

## 2019-09-02 MED ORDER — ARMODAFINIL 150 MG PO TABS: 150.0000 mg | ORAL_TABLET | Freq: Every day | ORAL | 0 refills | Status: DC

## 2019-09-02 MED ORDER — LURASIDONE HCL 40 MG PO TABS: 40.0000 mg | ORAL_TABLET | Freq: Every day | ORAL | 1 refills | Status: DC

## 2019-09-02 MED ORDER — ALPRAZOLAM 0.5 MG PO TABS: 0.5000 mg | ORAL_TABLET | Freq: Three times a day (TID) | ORAL | 2 refills | Status: DC | PRN

## 2019-09-02 NOTE — Progress Notes (Signed)
Leslie Rice [id number] September 28, 1965 54 y.o.  Virtual Visit via Video Note  I connected with pt @ on 09/02/19 at  8:20 AM EDT by a video enabled telemedicine application and verified that I am speaking with the correct person using two identifiers.   I discussed the limitations of evaluation and management by telemedicine and the availability of in person appointments. The patient expressed understanding and agreed to proceed.  I discussed the assessment and treatment plan with the patient. The patient was provided an opportunity to ask questions and all were answered. The patient agreed with the plan and demonstrated an understanding of the instructions.   The patient was advised to call back or seek an in-person evaluation if the symptoms worsen or if the condition fails to improve as anticipated.  I provided 30 minutes of non-face-to-face time during this encounter.  The patient was located at home.  The provider was located at [facility].   Dorothyann Gibbs, NP   Subjective:   Patient ID:  Leslie Rice is a 54 y.o. (DOB Mar 07, 1966) female.  Chief Complaint: No chief complaint on file.   HPI Leslie Rice presents for follow-up of MDD, GAD, insomnia. BPD2.   Referred by PCP  Describes mood today as "ok". Pleasant. Mood symptoms - denies depression. Feels anxious and irritable. Has been more irritable lately and has a short temper - work and my daughters. Stating "I feel like the biggest issues is my short temper". Has big projects going on at work. Daughters - dealing with their father. Has stopped drinking coffee and Red Bull throughout the day. Stable interest and motivation. Taking medications as prescribed.  Energy levels stable. Walking three days a week at gym.  Enjoys some usual interests and activities. Single. Has 2 children 22 and 19. One daughter lives in [place] - just graduated from [employer]. Other daughter at [employer] - lives with boyfriend.  Talking to daughters. Mostly staying home. Going to the pool. Appetite adequate. Weight stable. Sleeps well most nights. Averages 8 to 9 hours - taking Hydroxyzine. Using CPAP machine.  Focus and concentration "a little better". Completing tasks. Managing aspects of household. Work going well - "stressfull".  Works full-time in Consulting civil engineer.  Denies SI or HI. Denies AH or VH.  Previously seen by: Valinda Hoar   Previous medications: Zoloft, Wellbutrin, Lexapro, Effexor  Review of Systems:  Review of Systems  Musculoskeletal: Negative for gait problem.  Neurological: Negative for tremors.  Psychiatric/Behavioral:       Please refer to HPI    Medications: I have reviewed the patient's current medications.  Current Outpatient Medications  Medication Sig Dispense Refill  . Armodafinil 150 MG tablet Take 1 tablet (150 mg total) by mouth daily. 30 tablet 2  . carvedilol (COREG) 6.25 MG tablet Take 1 tablet (6.25 mg total) by mouth 2 (two) times daily. 180 tablet 1  . Cholecalciferol (VITAMIN D) 50 MCG (2000 UT) CAPS Take 1 capsule by mouth.    . Cyanocobalamin (VITAMIN B 12 PO) Take 1 capsule by mouth.    Marland Kitchen FLUoxetine (PROZAC) 20 MG capsule Take 1 capsule (20 mg total) by mouth daily. 90 capsule 1  . HYDROcodone-acetaminophen (NORCO) 10-325 MG per tablet Take 1 tablet by mouth every 6 (six) hours as needed for moderate pain.     Marland Kitchen lurasidone (LATUDA) 20 MG TABS tablet Take 20 mg by mouth daily.    . promethazine (PHENERGAN) 25 MG tablet Take 1 tablet (25 mg  total) by mouth every 6 (six) hours as needed for nausea or vomiting. 30 tablet 1  . rivaroxaban (XARELTO) 20 MG TABS tablet Take 1 tablet (20 mg total) by mouth daily with supper. 90 tablet 2  . SF 5000 PLUS 1.1 % CREA dental cream     . SUMAtriptan (IMITREX) 50 MG tablet One tablet by mouth at start of migraine.  May repeat in 2 hours if no improvement x 1 dose. 10 tablet 2  . Vitamin D, Ergocalciferol, (DRISDOL) 1.25 MG (50000 UT) CAPS  capsule Take 1 capsule (50,000 Units total) by mouth every 7 (seven) days. 12 capsule 0   No current facility-administered medications for this visit.    Medication Side Effects: None  Allergies:  Allergies  Allergen Reactions  . Ciprofloxacin     REACTION: vomiting  . Codeine     REACTION: vomiting  . Sulfonamide Derivatives     hives    Past Medical History:  Diagnosis Date  . *Depression 07/21/2008   Used to see a Social worker. Meds RF by previous PCP    . ALLERGIC RHINITIS   . Annual physical exam 02/29/2012  . B12 deficiency 07/21/2008   Qualifier: Diagnosis of  By: Nils Pyle CMA (AAMA), Mearl Latin    . Chronic pain syndrome    thoracic back pain, seeing Dr. Nelva Bush  . DEPRESSION   . DVT (deep venous thrombosis) ([place])   . Edema 07/21/2008   Qualifier: Diagnosis of  By: Nils Pyle CMA (AAMA), Mearl Latin    . ELEVATED BLOOD PRESSURE   . FATIGUE 07/21/2008   Qualifier: Diagnosis of  By: Nils Pyle CMA ([place]), Mearl Latin    . GERD   . HYPERLIPIDEMIA   . Hyperlipidemia 07/21/2008   Intolerant to simvastatin and Pravachol   . Hypersomnia with sleep apnea 10/20/2015  . INSOMNIA   . Insomnia 07/21/2008   Qualifier: Diagnosis of  By: Nils Pyle CMA (AAMA), Mearl Latin    . Irritable bowel syndrome   . MIGRAINE HEADACHE   . Migraine headache 07/21/2008   Menstrual related, used to take HRT, now unable to due to clots Used to see Dr Erling Cruz     . Morbid obesity due to excess calories ([place]) 10/20/2015  . OSA (obstructive sleep apnea)    not on Cpap @ present 11-2013  . Primary hypercoagulable state ([place])   . Pulmonary emboli ([place])   . Pulmonary embolism on long-term anticoagulation therapy ([place]) 10/20/2015  . PULMONARY EMBOLISM, HX OF 2002 07/21/2008   Qualifier: Diagnosis of  By: Nils Pyle CMA ([place]), Mearl Latin    . Seasonal allergies 07/26/2015  . Sleep related headaches 10/20/2015  . Vitamin D deficiency 07/21/2008   Qualifier: Diagnosis of  By: Nils Pyle CMA Deborra Medina), Mearl Latin      Family History  Problem Relation Age of  Onset  . Colon cancer Other        M, aunt, nephew  . CAD Father        age? "silent MIs"  . Lung cancer Father   . Diabetes Father        F, sisters x2  . Thyroid cancer Sister   . Colon cancer Sister        dx age 65  . Breast cancer Neg Hx   . Stroke Neg Hx     Social History   Socioeconomic History  . Marital status: Single    Spouse name: Not on file  . Number of children: 2  . Years of education: Not on file  . Highest  education level: Not on file  Occupational History  . 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 Smoking status: Never Smoker  . Smokeless tobacco: Never Used  Substance and Sexual Activity  . Alcohol use: Yes    Comment: occ  . Drug use: No  . Sexual activity: Not Currently    Birth control/protection: I.U.D.  Other Topics Concern  . Not on file  Social History Narrative   1 daughter live w/ her    One graduated from [employer], lives in [place]       Social Determinants of Health   Financial Resource Strain:   . Difficulty of Paying Living Expenses:   Food Insecurity:   . Worried About Programme researcher, broadcasting/film/video in the Last Year:   . Barista in the Last Year:   Transportation Needs:   . Freight forwarder (Medical):   Marland Kitchen Lack of Transportation (Non-Medical):   Physical Activity:   . Days of Exercise per Week:   . Minutes of Exercise per Session:   Stress:   . Feeling of Stress :   Social Connections:   . Frequency of Communication with Friends and Family:   . Frequency of Social Gatherings with Friends and Family:   . Attends Religious Services:   . Active Member of Clubs or Organizations:   . Attends Banker Meetings:   Marland Kitchen Marital Status:   Intimate Partner Violence:   . Fear of Current or Ex-Partner:   . Emotionally Abused:   Marland Kitchen Physically Abused:   . Sexually Abused:     Past Medical History, Surgical history, Social history, and Family history were reviewed and updated as appropriate.   Please see  review of systems for further details on the patient's review from today.   Objective:   Physical Exam:  There were no vitals taken for this visit.  Physical Exam Neurological:     Mental Status: She is alert and oriented to person, place, and time.     Cranial Nerves: No dysarthria.  Psychiatric:        Attention and Perception: Attention and perception normal.        Mood and Affect: Mood normal.        Speech: Speech normal.        Behavior: Behavior is cooperative.        Thought Content: Thought content normal. Thought content is not paranoid or delusional. Thought content does not include homicidal or suicidal ideation. Thought content does not include homicidal or suicidal plan.        Cognition and Memory: Cognition and memory normal.        Judgment: Judgment normal.     Comments: Insight intact     Lab Review:     Component Value Date/Time   NA 142 12/09/2018 0852   K 4.2 12/09/2018 0852   CL 108 12/09/2018 0852   CO2 25 12/09/2018 0852   GLUCOSE 90 12/09/2018 0852   BUN 7 12/09/2018 0852   CREATININE 0.71 12/09/2018 0852   CALCIUM 8.8 12/09/2018 0852   PROT 6.7 12/09/2018 0852   ALBUMIN 4.2 12/09/2018 0852   AST 14 12/09/2018 0852   ALT 8 12/09/2018 0852   ALKPHOS 68 12/09/2018 0852   BILITOT 0.5 12/09/2018 0852   GFRNONAA >60 03/18/2018 0347   GFRAA >60 03/18/2018 0347       Component Value Date/Time   WBC 5.0 03/11/2019 1107   RBC 4.73 03/11/2019 1107  HGB 14.2 03/11/2019 1107   HCT 40.8 03/11/2019 1107   PLT 187.0 03/11/2019 1107   MCV 86.3 03/11/2019 1107   MCH 30.7 03/18/2018 0347   MCHC 34.7 03/11/2019 1107   RDW 16.1 (H) 03/11/2019 1107   LYMPHSABS 1.8 03/11/2019 1107   MONOABS 0.2 03/11/2019 1107   EOSABS 0.1 03/11/2019 1107   BASOSABS 0.0 03/11/2019 1107    No results found for: POCLITH, LITHIUM   No results found for: PHENYTOIN, PHENOBARB, VALPROATE, CBMZ   .res Assessment: Plan:   Plan:  1. Prozac 20mg  daily 2. Hydroxyzine  25mg  tablet - 1 to 3 at bedtime  3. Xanax 0.25mg  TID prn anxiety  4. Latuda 20mg  daily 5. Armodafonil 150mg  daily - sleep apnea  Follow up with Neurologist - Nuvigil or Provigil   RTC 4 weeks  Patient advised to contact office with any questions, adverse effects, or acute worsening in signs and symptoms.  Discussed potential benefits, risk, and side effects of benzodiazepines to include potential risk of tolerance and dependence, as well as possible drowsiness.  Advised patient not to drive if experiencing drowsiness and to take lowest possible effective dose to minimize risk of dependence and tolerance.  Discussed potential metabolic side effects associated with atypical antipsychotics, as well as potential risk for movement side effects. Advised pt to contact office if movement side effects occur.   There are no diagnoses linked to this encounter.   Please see After Visit Summary for patient specific instructions.  Future Appointments  Date Time Provider Department Center  09/02/2019  8:20 AM [place], , NP [facility] None  12/23/2019  8:20 AM , MD [facility]    No orders of the defined types were placed in this encounter.     -------------------------------

## 2019-09-02 NOTE — Telephone Encounter (Signed)
[name], rorrer are scheduled for a virtual visit with your provider today.    Just as we do with appointments in the office, we must obtain your consent to participate.  Your consent will be active for this visit and any virtual visit you may have with one of our providers in the next 365 days.    If you have a MyChart account, I can also send a copy of this consent to you electronically.  All virtual visits are billed to your insurance company just like a traditional visit in the office.  As this is a virtual visit, video technology does not allow for your provider to perform a traditional examination.  This may limit your provider's ability to fully assess your condition.  If your provider identifies any concerns that need to be evaluated in person or the need to arrange testing such as labs, EKG, etc, we will make arrangements to do so.    Although advances in technology are sophisticated, we cannot ensure that it will always work on either your end or our end.  If the connection with a video visit is poor, we may have to switch to a telephone visit.  With either a video or telephone visit, we are not always able to ensure that we have a secure connection.   I need to obtain your verbal consent now.   Are you willing to proceed with your visit today?   Leslie Rice has provided verbal consent on 09/02/2019 for a virtual visit (video or telephone).   Dorothyann Gibbs, NP 09/02/2019  8:19 AM

## 2019-09-23 ENCOUNTER — Telehealth: Payer: Self-pay | Admitting: Adult Health

## 2019-09-23 MED ORDER — ARMODAFINIL 250 MG PO TABS: 250.0000 mg | ORAL_TABLET | Freq: Every day | ORAL | 2 refills | Status: DC

## 2019-09-23 NOTE — Telephone Encounter (Signed)
She's aware of the increase. Advised her to watch caffeine intake and watch for symptoms of feeling jittery. Informed her to call back with any further questions or concerns.

## 2019-09-23 NOTE — Telephone Encounter (Signed)
Leslie Rice called and LM reporting the the Armodafinil is working well but feels that if it was increased in dose it would be better.  Please call discuss you thoughts on this.

## 2019-09-23 NOTE — Telephone Encounter (Signed)
It comes in a 250mg  - I sent that in for her to try.

## 2019-10-07 ENCOUNTER — Telehealth: Payer: Self-pay | Admitting: Adult Health

## 2019-10-07 ENCOUNTER — Telehealth: Payer: Self-pay

## 2019-10-07 NOTE — Telephone Encounter (Signed)
Prior approval received for Armodafinil 250 mg effective 10/07/2019-10/06/2020  Patient aware and pharmacy contacted.

## 2019-10-07 NOTE — Telephone Encounter (Signed)
Prior authorization submitted and approved for ARMODAFINIL 250 MG effective 10/07/2019-10/06/2020 with [employer] ID# [id number], PA# [id number]

## 2019-10-07 NOTE — Telephone Encounter (Signed)
Prior authorization submitted for ARMODAFINIL 250 MG with [employer] ID# [id number], pending response at this time.

## 2019-10-07 NOTE — Telephone Encounter (Signed)
Per pt PA is needed on the new dose of Armodafanil 250mg .  It is at the [facility] on [address] Can we please get this PA started?

## 2019-10-20 ENCOUNTER — Encounter: Payer: Self-pay | Admitting: Internal Medicine

## 2019-11-28 ENCOUNTER — Other Ambulatory Visit: Payer: Self-pay | Admitting: Cardiology

## 2019-12-22 ENCOUNTER — Other Ambulatory Visit: Payer: Self-pay | Admitting: Adult Health

## 2019-12-22 DIAGNOSIS — F411 Generalized anxiety disorder: Secondary | ICD-10-CM

## 2019-12-23 ENCOUNTER — Encounter: Payer: Self-pay | Admitting: Internal Medicine

## 2019-12-23 ENCOUNTER — Ambulatory Visit (INDEPENDENT_AMBULATORY_CARE_PROVIDER_SITE_OTHER): Payer: Managed Care, Other (non HMO) | Admitting: Internal Medicine

## 2019-12-23 ENCOUNTER — Other Ambulatory Visit: Payer: Self-pay

## 2019-12-23 VITALS — BP 123/86 | HR 67 | Temp 98.1°F | Resp 16 | Ht 63.0 in | Wt 223.0 lb

## 2019-12-23 DIAGNOSIS — E785 Hyperlipidemia, unspecified: Secondary | ICD-10-CM

## 2019-12-23 DIAGNOSIS — E559 Vitamin D deficiency, unspecified: Secondary | ICD-10-CM | POA: Diagnosis not present

## 2019-12-23 DIAGNOSIS — Z23 Encounter for immunization: Secondary | ICD-10-CM

## 2019-12-23 DIAGNOSIS — Z0000 Encounter for general adult medical examination without abnormal findings: Secondary | ICD-10-CM | POA: Diagnosis not present

## 2019-12-23 DIAGNOSIS — Z1159 Encounter for screening for other viral diseases: Secondary | ICD-10-CM

## 2019-12-23 DIAGNOSIS — G4733 Obstructive sleep apnea (adult) (pediatric): Secondary | ICD-10-CM

## 2019-12-23 DIAGNOSIS — Z1231 Encounter for screening mammogram for malignant neoplasm of breast: Secondary | ICD-10-CM

## 2019-12-23 MED ORDER — RIVAROXABAN 20 MG PO TABS
20.0000 mg | ORAL_TABLET | Freq: Every day | ORAL | 3 refills | Status: DC
Start: 2019-12-23 — End: 2021-02-13

## 2019-12-23 MED ORDER — PROMETHAZINE HCL 25 MG PO TABS: 25.0000 mg | ORAL_TABLET | Freq: Four times a day (QID) | ORAL | 1 refills | Status: DC | PRN

## 2019-12-23 NOTE — Assessment & Plan Note (Signed)
-  [id number] -Had C-19 shots -Flu shot today -[facility]  +FHSister age 52 dx w/ colon cancer .Mother and aunt had colon ca age dx in their 66s Pt had a cscope at age 39, normal @ [facility] S/p cscope #2 on 03-2015, normal per report, 5 years.  Will await GI letter -female care:Per gynecology, order mammogram. -Labs: CMP, FLP, CBC, vitamin D, hep C -Diet and exercise discussed

## 2019-12-23 NOTE — Progress Notes (Signed)
Pre visit review using our clinic review tool, if applicable. No additional management support is needed unless otherwise documented below in the visit note. 

## 2019-12-23 NOTE — Progress Notes (Signed)
Subjective:    Patient ID: Leslie Rice, female    DOB: 02-23-1966, 54 y.o.   MRN: [id number]  DOS:  12/23/2019 Type of visit - description: CPX Here for CPX. No new symptoms Had a recent migraine, not unusual or different. Needs multiple refills. anxiety, depression: Now under the care of psychiatry.    Review of Systems  Other than above, a 14 point review of systems is negative    Past Medical History:  Diagnosis Date  . *Depression 07/21/2008   Used to see a Veterinary surgeon. Meds RF by previous PCP    . B12 deficiency 07/21/2008   Qualifier: Diagnosis of  By: Koleen Distance CMA (AAMA), Hulan Saas    . Chronic pain syndrome    thoracic back pain, seeing Dr. Ethelene Hal  . DEPRESSION   . DVT (deep venous thrombosis) (HCC)   . Edema 07/21/2008   Qualifier: Diagnosis of  By: Koleen Distance CMA (AAMA), Hulan Saas    . FATIGUE 07/21/2008   Qualifier: Diagnosis of  By: Koleen Distance CMA (AAMA), Hulan Saas    . GERD   . Hyperlipidemia 07/21/2008   Intolerant to simvastatin and Pravachol   . Hypersomnia with sleep apnea 10/20/2015  . Insomnia 07/21/2008   Qualifier: Diagnosis of  By: Koleen Distance CMA (AAMA), Hulan Saas    . Irritable bowel syndrome   . Migraine headache 07/21/2008   Menstrual related, used to take HRT, now unable to due to clots Used to see Dr Sandria Manly     . Morbid obesity due to excess calories (HCC) 10/20/2015  . OSA (obstructive sleep apnea)    not on Cpap @ present 11-2013  . Primary hypercoagulable state (HCC)   . PULMONARY EMBOLISM, HX OF 2002 07/21/2008   Qualifier: Diagnosis of  By: Koleen Distance CMA (AAMA), Hulan Saas    . Seasonal allergies 07/26/2015  . Sleep related headaches 10/20/2015  . Vitamin D deficiency 07/21/2008   Qualifier: Diagnosis of  By: Koleen Distance CMA Duncan Dull), Hulan Saas      Past Surgical History:  Procedure Laterality Date  . c section     x 2   . CARPAL TUNNEL RELEASE     B  . HYSTEROSCOPY W/ ENDOMETRIAL ABLATION  01/16/2019  . NASAL SINUS SURGERY      Allergies as of 12/23/2019      Reactions    Ciprofloxacin    REACTION: vomiting   Codeine    REACTION: vomiting   Sulfonamide Derivatives    hives      Medication List       Accurate as of December 23, 2019  9:46 PM. If you have any questions, ask your nurse or doctor.        STOP taking these medications   Vitamin D (Ergocalciferol) 1.25 MG (50000 UNIT) Caps capsule Commonly known as: DRISDOL Stopped by: Willow Ora, MD     TAKE these medications   ALPRAZolam 0.5 MG tablet Commonly known as: XANAX TAKE 1 TABLET BY MOUTH THREE TIMES DAILY AS NEEDED FOR ANXIETY   Armodafinil 250 MG tablet Take 1 tablet (250 mg total) by mouth daily. What changed: Another medication with the same name was removed. Continue taking this medication, and follow the directions you see here. Changed by: Willow Ora, MD   carvedilol 6.25 MG tablet Commonly known as: COREG Take 1 tablet by mouth twice daily   FLUoxetine 20 MG capsule Commonly known as: PROzac Take 1 capsule (20 mg total) by mouth daily.   HYDROcodone-acetaminophen 10-325 MG tablet Commonly known as: NORCO Take  1 tablet by mouth every 6 (six) hours as needed for moderate pain.   hydrOXYzine 25 MG tablet Commonly known as: ATARAX/VISTARIL Take 1 tablet (25 mg total) by mouth 3 (three) times daily as needed.   lurasidone 40 MG Tabs tablet Commonly known as: Latuda Take 1 tablet (40 mg total) by mouth daily with breakfast.   promethazine 25 MG tablet Commonly known as: PHENERGAN Take 1 tablet (25 mg total) by mouth every 6 (six) hours as needed for nausea or vomiting.   rivaroxaban 20 MG Tabs tablet Commonly known as: Xarelto Take 1 tablet (20 mg total) by mouth daily with supper.   SF 5000 Plus 1.1 % Crea dental cream Generic drug: sodium fluoride   SUMAtriptan 50 MG tablet Commonly known as: IMITREX One tablet by mouth at start of migraine.  May repeat in 2 hours if no improvement x 1 dose.   VITAMIN B 12 PO Take 1 capsule by mouth.   Vitamin D 50 MCG (2000  UT) Caps Take 1 capsule by mouth.          Objective:   Physical Exam BP 123/86 (BP Location: Left Arm, Patient Position: Sitting, Cuff Size: Normal)   Pulse 67   Temp 98.1 F (36.7 C) (Oral)   Resp 16   Ht 5\' 3"  (1.6 m)   Wt 223 lb (101.2 kg)   SpO2 96%   BMI 39.50 kg/m  General: Well developed, NAD, BMI noted Neck: No  thyromegaly  HEENT:  Normocephalic . Face symmetric, atraumatic Lungs:  CTA B Normal respiratory effort, no intercostal retractions, no accessory muscle use. Heart: RRR,  no murmur.  Abdomen:  Not distended, soft, non-tender. No rebound or rigidity.   Lower extremities: no pretibial edema bilaterally  Skin: Exposed areas without rash. Not pale. Not jaundice Neurologic:  alert & oriented X3.  Speech normal, gait appropriate for age and unassisted Strength symmetric and appropriate for age.  Psych: Cognition and judgment appear intact.  Cooperative with normal attention span and concentration.  Behavior appropriate. No anxious or depressed appearing.     Assessment    Assessment HTN, mild. Primary hypercoagulable state, change Coumadin to Xarelto 05-2014 -PE 2002 while on HRT, saw Dr 2003 ;+ FH, testing on her was (-);  rx coumadin since 2002 for life  -superficial phlebitis 11-2014 - PE DVT 03/2018 (d/t non compliant w/ xarelto) Hyperlipidemia-- Pravachol, simvastatin caused aches  Morbidly obese Depression, anxiety,  insomnia:  since divorce 2007 Lamictal helped according to my notes however she self dc it ~ 08-2014  Migraine headaches: sees neuro as of 12/2018 .on imitrex per pcp.  Used to take BB, Topamax IBS  OSA : + test before , repeat sleep study + 2017,   Vit B12 and Vit D def-- poor compliance w/ supplements Chronic pain syndrome ---hydrocodone Rx per Dr. 2018 Chest pain: Saw cards 12/2018, echo: LVH, nuclear stress test negative. Endometrial ablation 01/2019  PLAN Here for CPX HTN: Well-controlled on  carvedilol. Hypercoagulable state, refill Xarelto Hyperlipidemia, diet controlled. Depression, anxiety, insomnia: Follow-up elsewhere since the last visit. Migraine headaches: Refill Phenergan. OSA: Request a referral to neurology, will do. Vitamin B and D deficiency: Had ergocalciferol, continue OTCs, check vitamin D RTC 1 year     This visit occurred during the SARS-CoV-2 public health emergency.  Safety protocols were in place, including screening questions prior to the visit, additional usage of staff PPE, and extensive cleaning of exam room while observing appropriate contact time as indicated  for disinfecting solutions.

## 2019-12-23 NOTE — Patient Instructions (Addendum)
   GO TO THE LAB : Get the blood work     GO TO THE FRONT DESK, PLEASE SCHEDULE YOUR APPOINTMENTS Come back for a physical exam in 1 year  STOP BY THE FIRST FLOOR: Schedule mammogram

## 2019-12-23 NOTE — Assessment & Plan Note (Signed)
Here for CPX HTN: Well-controlled on carvedilol. Hypercoagulable state, refill Xarelto Hyperlipidemia, diet controlled. Depression, anxiety, insomnia: Follow-up elsewhere since the last visit. Migraine headaches: Refill Phenergan. OSA: Request a referral to neurology, will do. Vitamin B and D deficiency: Had ergocalciferol, continue OTCs, check vitamin D RTC 1 year

## 2019-12-24 LAB — CBC WITH DIFFERENTIAL/PLATELET
Absolute Monocytes: 193 cells/uL — ABNORMAL LOW (ref 200–950)
Basophils Absolute: 18 cells/uL (ref 0–200)
Basophils Relative: 0.4 %
Eosinophils Absolute: 83 cells/uL (ref 15–500)
Eosinophils Relative: 1.8 %
HCT: 40.5 % (ref 35.0–45.0)
Hemoglobin: 13.8 g/dL (ref 11.7–15.5)
Lymphs Abs: 1357 cells/uL (ref 850–3900)
MCH: 30.8 pg (ref 27.0–33.0)
MCHC: 34.1 g/dL (ref 32.0–36.0)
MCV: 90.4 fL (ref 80.0–100.0)
MPV: 9.6 fL (ref 7.5–12.5)
Monocytes Relative: 4.2 %
Neutro Abs: 2949 cells/uL (ref 1500–7800)
Neutrophils Relative %: 64.1 %
Platelets: 229 10*3/uL (ref 140–400)
RBC: 4.48 10*6/uL (ref 3.80–5.10)
RDW: 12.1 % (ref 11.0–15.0)
Total Lymphocyte: 29.5 %
WBC: 4.6 10*3/uL (ref 3.8–10.8)

## 2019-12-24 LAB — LIPID PANEL
Cholesterol: 270 mg/dL — ABNORMAL HIGH (ref <200)
HDL: 38 mg/dL — ABNORMAL LOW (ref >=50)
LDL Cholesterol (Calc): 193 mg/dL (calc) — ABNORMAL HIGH
Non-HDL Cholesterol (Calc): 232 mg/dL (calc) — ABNORMAL HIGH (ref <130)
Total CHOL/HDL Ratio: 7.1 (calc) — ABNORMAL HIGH (ref <5.0)
Triglycerides: 209 mg/dL — ABNORMAL HIGH (ref <150)

## 2019-12-24 LAB — COMPREHENSIVE METABOLIC PANEL
AG Ratio: 1.8 (calc) (ref 1.0–2.5)
ALT: 12 U/L (ref 6–29)
AST: 14 U/L (ref 10–35)
Albumin: 4.2 g/dL (ref 3.6–5.1)
Alkaline phosphatase (APISO): 57 U/L (ref 37–153)
BUN: 12 mg/dL (ref 7–25)
CO2: 24 mmol/L (ref 20–32)
Calcium: 9 mg/dL (ref 8.6–10.4)
Chloride: 107 mmol/L (ref 98–110)
Creat: 0.85 mg/dL (ref 0.50–1.05)
Globulin: 2.4 g/dL (calc) (ref 1.9–3.7)
Glucose, Bld: 94 mg/dL (ref 65–99)
Potassium: 4.4 mmol/L (ref 3.5–5.3)
Sodium: 141 mmol/L (ref 135–146)
Total Bilirubin: 0.6 mg/dL (ref 0.2–1.2)
Total Protein: 6.6 g/dL (ref 6.1–8.1)

## 2019-12-24 LAB — HEPATITIS C ANTIBODY
Hepatitis C Ab: NONREACTIVE
SIGNAL TO CUT-OFF: 0.02 (ref <1.00)

## 2019-12-24 LAB — VITAMIN D 25 HYDROXY (VIT D DEFICIENCY, FRACTURES): Vit D, 25-Hydroxy: 41 ng/mL (ref 30–100)

## 2019-12-25 ENCOUNTER — Ambulatory Visit (HOSPITAL_BASED_OUTPATIENT_CLINIC_OR_DEPARTMENT_OTHER): Payer: Managed Care, Other (non HMO)

## 2019-12-29 ENCOUNTER — Ambulatory Visit (HOSPITAL_BASED_OUTPATIENT_CLINIC_OR_DEPARTMENT_OTHER): Payer: Managed Care, Other (non HMO)

## 2019-12-29 NOTE — Addendum Note (Signed)
Addended [place]rad [place] on: 12/29/2019 09:50 AM   Modules accepted: Orders

## 2019-12-30 ENCOUNTER — Other Ambulatory Visit: Payer: Self-pay

## 2019-12-30 ENCOUNTER — Ambulatory Visit (HOSPITAL_BASED_OUTPATIENT_CLINIC_OR_DEPARTMENT_OTHER)
Admission: RE | Admit: 2019-12-30 | Discharge: 2019-12-30 | Disposition: A | Discharge status: Home / Self Care | Payer: Managed Care, Other (non HMO) | Source: Ambulatory Visit | Attending: Internal Medicine | Admitting: Internal Medicine

## 2019-12-30 DIAGNOSIS — Z1231 Encounter for screening mammogram for malignant neoplasm of breast: Secondary | ICD-10-CM | POA: Diagnosis not present

## 2020-01-04 ENCOUNTER — Other Ambulatory Visit: Payer: Self-pay | Admitting: Adult Health

## 2020-01-18 ENCOUNTER — Telehealth: Payer: Self-pay | Admitting: Adult Health

## 2020-01-18 ENCOUNTER — Other Ambulatory Visit: Payer: Self-pay

## 2020-01-18 DIAGNOSIS — F411 Generalized anxiety disorder: Secondary | ICD-10-CM

## 2020-01-18 MED ORDER — ALPRAZOLAM 0.5 MG PO TABS: 0.5000 mg | ORAL_TABLET | Freq: Three times a day (TID) | ORAL | 0 refills | Status: DC | PRN

## 2020-01-18 NOTE — Telephone Encounter (Signed)
Pt called and said that she needs a refill on her xanax she has an appointment 10/18.  Please send to [facility] on [address]

## 2020-01-18 NOTE — Telephone Encounter (Signed)
Last refill 12/22/19 pended for Almira Coaster to send

## 2020-01-25 ENCOUNTER — Telehealth (INDEPENDENT_AMBULATORY_CARE_PROVIDER_SITE_OTHER): Payer: 59 | Admitting: Adult Health

## 2020-01-25 ENCOUNTER — Encounter: Payer: Self-pay | Admitting: Adult Health

## 2020-01-25 DIAGNOSIS — F3181 Bipolar II disorder: Secondary | ICD-10-CM

## 2020-01-25 DIAGNOSIS — F411 Generalized anxiety disorder: Secondary | ICD-10-CM

## 2020-01-25 DIAGNOSIS — F331 Major depressive disorder, recurrent, moderate: Secondary | ICD-10-CM

## 2020-01-25 DIAGNOSIS — G4700 Insomnia, unspecified: Secondary | ICD-10-CM | POA: Diagnosis not present

## 2020-01-25 MED ORDER — FLUOXETINE HCL 20 MG PO CAPS: 20.0000 mg | ORAL_CAPSULE | Freq: Every day | ORAL | 1 refills | Status: DC

## 2020-01-25 MED ORDER — LURASIDONE HCL 40 MG PO TABS: 40.0000 mg | ORAL_TABLET | Freq: Every day | ORAL | 1 refills | Status: DC

## 2020-01-25 MED ORDER — ARMODAFINIL 250 MG PO TABS
250.0000 mg | ORAL_TABLET | Freq: Every day | ORAL | 2 refills | Status: DC
Start: 2020-01-25 — End: 2020-02-04

## 2020-01-25 MED ORDER — HYDROXYZINE HCL 25 MG PO TABS: 25.0000 mg | ORAL_TABLET | Freq: Three times a day (TID) | ORAL | 1 refills | Status: DC | PRN

## 2020-01-25 MED ORDER — ALPRAZOLAM 0.5 MG PO TABS: 0.5000 mg | ORAL_TABLET | Freq: Three times a day (TID) | ORAL | 2 refills | Status: DC | PRN

## 2020-01-25 NOTE — Progress Notes (Signed)
Leslie Rice [id number] [birth year] 54 y.o.  Virtual Visit via Video Note  I connected with pt @ on 01/25/20 at  5:20 PM EDT by a video enabled telemedicine application and verified that I am speaking with the correct person using two identifiers.   I discussed the limitations of evaluation and management by telemedicine and the availability of in person appointments. The patient expressed understanding and agreed to proceed.  I discussed the assessment and treatment plan with the patient. The patient was provided an opportunity to ask questions and all were answered. The patient agreed with the plan and demonstrated an understanding of the instructions.   The patient was advised to call back or seek an in-person evaluation if the symptoms worsen or if the condition fails to improve as anticipated.  I provided 30 minutes of non-face-to-face time during this encounter.  The patient was located at home.  [facility] provider [facility]   Dorothyann Gibbsegina N Kordelia Severin, NP   Subjective:   Patient ID:  Leslie Rice is a 54 y.o. (DOB [birth year]) female.  Chief Complaint: No chief complaint on file.   HPI Leslie Rice presents for follow-up of MDD, GAD, insomnia. BPD2.   Referred by PCP  Describes mood today as "ok". Pleasant. Mood symptoms - reports some depression - "situational". Feels anxious and irritable at times. Stating "I'm doing alright". [place] Has been "adjusting" to her being gone. Feels like Modafinil has been a Physiological scientist"lifesaver". Stating "it has been a night and day change for me". Stable interest and motivation. Taking medications as prescribed.  Energy levels stable. Walking three days a week at gym.  Enjoys some usual interests and activities. Single. Has 2 children 24 and 21. Younger [facility] at [employer] [facility] [employer]. Mostly staying home.  Appetite adequate. Weight stable. Sleeps well most nights. Averages 8 to 9 hours - with  Hydroxyzine. Using CPAP machine.  Focus and concentration improved. Completing tasks. Managing aspects of household. 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  Denies SI or HI. Denies AH or VH.  Previously seen by: Valinda HoarMeredith Baker   Previous medications: Zoloft, Wellbutrin, Lexapro, Effexor   Review of Systems:  Review of Systems  Musculoskeletal: Negative for gait problem.  Neurological: Negative for tremors.  Psychiatric/Behavioral:       Please refer to HPI    Medications: I have reviewed the patient's current medications.  Current Outpatient Medications  Medication Sig Dispense Refill   ALPRAZolam (XANAX) 0.5 MG tablet Take 1 tablet (0.5 mg total) by mouth 3 (three) times daily as needed. 90 tablet 2   Armodafinil 250 MG tablet Take 1 tablet (250 mg total) by mouth daily. 30 tablet 2   carvedilol (COREG) 6.25 MG tablet Take 1 tablet by mouth twice daily 180 tablet 1   Cholecalciferol (VITAMIN D) 50 MCG (2000 UT) CAPS Take 1 capsule by mouth.     Cyanocobalamin (VITAMIN B 12 PO) Take 1 capsule by mouth.     FLUoxetine (PROZAC) 20 MG capsule Take 1 capsule (20 mg total) by mouth daily. 90 capsule 1   HYDROcodone-acetaminophen (NORCO) 10-325 MG per tablet Take 1 tablet by mouth every 6 (six) hours as needed for moderate pain.      hydrOXYzine (ATARAX/VISTARIL) 25 MG tablet Take 1 tablet (25 mg total) by mouth 3 (three) times daily as needed. 270 tablet 1   lurasidone (LATUDA) 40 MG TABS tablet Take 1 tablet (40 mg total) by mouth daily with breakfast. 90 tablet  1   promethazine (PHENERGAN) 25 MG tablet Take 1 tablet (25 mg total) by mouth every 6 (six) hours as needed for nausea or vomiting. 30 tablet 1   rivaroxaban (XARELTO) 20 MG TABS tablet Take 1 tablet (20 mg total) by mouth daily with supper. 90 tablet 3   SF 5000 PLUS 1.1 % CREA dental cream      SUMAtriptan (IMITREX) 50 MG tablet One tablet by mouth at start of migraine.  May repeat in 2 hours if no improvement x 1 dose. 10 tablet  2   No current facility-administered medications for this visit.    Medication Side Effects: None  Allergies:  Allergies  Allergen Reactions   Ciprofloxacin     REACTION: vomiting   Codeine     REACTION: vomiting   Sulfonamide Derivatives     hives    Past Medical History:  Diagnosis Date   *Depression 07/21/2008   Used to see a Veterinary surgeon. Meds RF by previous PCP     B12 deficiency 07/21/2008   Qualifier: Diagnosis of  By: Koleen Distance CMA Duncan Dull), Leisha     Chronic pain syndrome    thoracic back pain, seeing Dr. Ethelene Hal   DEPRESSION    DVT (deep venous thrombosis) (HCC)    Edema 07/21/2008   Qualifier: Diagnosis of  By: Koleen Distance CMA (AAMA), Hulan Saas     FATIGUE 07/21/2008   Qualifier: Diagnosis of  By: Koleen Distance CMA (AAMA), Leisha     GERD    Hyperlipidemia 07/21/2008   Intolerant to simvastatin and Pravachol    Hypersomnia with sleep apnea 10/20/2015   Insomnia 07/21/2008   Qualifier: Diagnosis of  By: Koleen Distance CMA (AAMA), Hulan Saas     Irritable bowel syndrome    Migraine headache 07/21/2008   Menstrual related, used to take HRT, now unable to due to clots Used to see Dr Sandria Manly      Morbid obesity due to excess calories (HCC) 10/20/2015   OSA (obstructive sleep apnea)    not on Cpap @ present 11-2013   Primary hypercoagulable state (HCC)    PULMONARY EMBOLISM, HX OF 2002 07/21/2008   Qualifier: Diagnosis of  By: Koleen Distance CMA (AAMA), Leisha     Seasonal allergies 07/26/2015   Sleep related headaches 10/20/2015   Vitamin D deficiency 07/21/2008   Qualifier: Diagnosis of  By: Koleen Distance CMA Duncan Dull), Hulan Saas      Family History  Problem Relation Age of Onset   Colon cancer Other        M, aunt, nephew   CAD Father        age? 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   Tobacco Use   Smoking status: Never Smoker   Smokeless tobacco: Never Used  Vaping Use   Vaping Use: Never used  Substance and Sexual Activity   Alcohol use: Yes    Comment: occ   Drug use: No  Sexual activity: Not Currently  Other Topics Concern   Not on file  Social History Narrative   Live by herself   One graduated from [employer], lives in [place]       Social Determinants of Health   Financial Resource Strain:    Difficulty of Paying Living Expenses: Not on file  Food Insecurity:    Worried About Programme researcher, broadcasting/film/video in the Last Year: Not on file   [employer] of Food in the Last Year: Not on file  Transportation Needs:    Lack of Transportation (Medical): Not on file   Lack of Transportation (Non-Medical): Not on file  Physical Activity:    Days of Exercise per Week: Not on file   Minutes of Exercise per Session: Not on file  Stress:    Feeling of Stress : Not on file  Social Connections:    Frequency of Communication with Friends and Family: Not on file   Frequency of Social Gatherings with Friends and Family: Not on file   Attends Religious Services: Not on file   Active Member of Clubs or Organizations: Not on file   Attends Banker Meetings: Not on file   Marital Status: Not on file  Intimate Partner Violence:    Fear of Current or Ex-Partner: Not on file   Emotionally Abused: Not on file   Physically Abused: Not on file   Sexually Abused: Not on file    Past Medical History, Surgical history, Social history, and Family history were reviewed and updated as appropriate.   Please see review of systems for further details on the patient's review from today.   Objective:   Physical Exam:  There were no vitals taken for this  visit.  Physical Exam Constitutional:      General: She is not in acute distress. Musculoskeletal:        General: No deformity.  Neurological:     Mental Status: She is alert and oriented to person, place, and time.     Coordination: Coordination normal.  Psychiatric:        Attention and Perception: Attention and perception normal. She does not perceive auditory or visual hallucinations.        Mood and Affect: Mood normal. Mood is not anxious or depressed. Affect is not labile, blunt, angry or inappropriate.        Speech: Speech normal.        Behavior: Behavior normal.        Thought Content: Thought content normal. Thought content is not paranoid or delusional. Thought content does not include homicidal or suicidal ideation. Thought content does not include homicidal or suicidal plan.        Cognition and Memory: Cognition and memory normal.        Judgment: Judgment normal.     Comments: Insight intact     Lab Review:     Component Value Date/Time   NA 141 12/23/2019 0852   K 4.4 12/23/2019 0852   CL 107 12/23/2019 0852   CO2 24 12/23/2019 0852   GLUCOSE 94 12/23/2019 0852   BUN 12 12/23/2019 0852   CREATININE 0.85 12/23/2019 0852   CALCIUM 9.0 12/23/2019 0852   PROT 6.6 12/23/2019 0852   ALBUMIN 4.2 12/09/2018 0852   AST 14 12/23/2019 0852   ALT 12 12/23/2019 0852   ALKPHOS 68 12/09/2018 0852   BILITOT 0.6 12/23/2019 0852   GFRNONAA >60 03/18/2018 0347   GFRAA >60 03/18/2018 4967  Component Value Date/Time   WBC 4.6 12/23/2019 0852   RBC 4.48 12/23/2019 0852   HGB 13.8 12/23/2019 0852   HCT 40.5 12/23/2019 0852   PLT 229 12/23/2019 0852   MCV 90.4 12/23/2019 0852   MCH 30.8 12/23/2019 0852   MCHC 34.1 12/23/2019 0852   RDW 12.1 12/23/2019 0852   LYMPHSABS 1,357 12/23/2019 0852   MONOABS 0.2 03/11/2019 1107   EOSABS 83 12/23/2019 0852   BASOSABS 18 12/23/2019 0852    No results found for: POCLITH, LITHIUM   No results found for: PHENYTOIN,  PHENOBARB, VALPROATE, CBMZ   .res Assessment: Plan:    Plan:  1. Prozac 20mg  daily 2. Hydroxyzine 25mg  tablet - 1 to 3 at bedtime  3. Xanax 0.25mg  TID prn anxiety  4. Latuda 20mg  daily 5. Armodafonil 250mg  daily - sleep apnea  Follow up with Neurologist - Nuvigil or Provigil   RTC 3 months  Patient advised to contact office with any questions, adverse effects, or acute worsening in signs and symptoms.  Discussed potential benefits, risk, and side effects of benzodiazepines to include potential risk of tolerance and dependence, as well as possible drowsiness.  Advised patient not to drive if experiencing drowsiness and to take lowest possible effective dose to minimize risk of dependence and tolerance.  Discussed potential metabolic side effects associated with atypical antipsychotics, as well as potential risk for movement side effects. Advised pt to contact office if movement side effects occur.   Diagnoses and all orders for this visit:  Bipolar II disorder (HCC) -     FLUoxetine (PROZAC) 20 MG capsule; Take 1 capsule (20 mg total) by mouth daily. -     lurasidone (LATUDA) 40 MG TABS tablet; Take 1 tablet (40 mg total) by mouth daily with breakfast.  Insomnia, unspecified type -     hydrOXYzine (ATARAX/VISTARIL) 25 MG tablet; Take 1 tablet (25 mg total) by mouth 3 (three) times daily as needed.  Major depressive disorder, recurrent episode, moderate (HCC) -     FLUoxetine (PROZAC) 20 MG capsule; Take 1 capsule (20 mg total) by mouth daily.  Generalized anxiety disorder -     ALPRAZolam (XANAX) 0.5 MG tablet; Take 1 tablet (0.5 mg total) by mouth 3 (three) times daily as needed. -     FLUoxetine (PROZAC) 20 MG capsule; Take 1 capsule (20 mg total) by mouth daily. -     hydrOXYzine (ATARAX/VISTARIL) 25 MG tablet; Take 1 tablet (25 mg total) by mouth 3 (three) times daily as needed.  Other orders -     Armodafinil 250 MG tablet; Take 1 tablet (250 mg total) by mouth daily.      Please see After Visit Summary for patient specific instructions.  Future Appointments  Date Time Provider Department Center  02/02/2020  9:00 AM Dohmeier, , MD [facility] None  12/23/2020  8:00 AM , MD [facility]    No orders of the defined types were placed in this encounter.     -------------------------------

## 2020-01-31 ENCOUNTER — Encounter: Payer: Self-pay | Admitting: Neurology

## 2020-02-02 ENCOUNTER — Other Ambulatory Visit: Payer: Self-pay

## 2020-02-02 ENCOUNTER — Encounter: Payer: Self-pay | Admitting: Neurology

## 2020-02-02 ENCOUNTER — Ambulatory Visit: Payer: Managed Care, Other (non HMO) | Admitting: Neurology

## 2020-02-02 VITALS — BP 112/76 | HR 69 | Ht 63.0 in | Wt 223.0 lb

## 2020-02-02 DIAGNOSIS — G478 Other sleep disorders: Secondary | ICD-10-CM

## 2020-02-02 DIAGNOSIS — G4733 Obstructive sleep apnea (adult) (pediatric): Secondary | ICD-10-CM

## 2020-02-02 DIAGNOSIS — I2699 Other pulmonary embolism without acute cor pulmonale: Secondary | ICD-10-CM

## 2020-02-02 DIAGNOSIS — J302 Other seasonal allergic rhinitis: Secondary | ICD-10-CM

## 2020-02-02 DIAGNOSIS — Z7901 Long term (current) use of anticoagulants: Secondary | ICD-10-CM

## 2020-02-02 DIAGNOSIS — D6859 Other primary thrombophilia: Secondary | ICD-10-CM

## 2020-02-02 DIAGNOSIS — T45515A Adverse effect of anticoagulants, initial encounter: Secondary | ICD-10-CM

## 2020-02-02 NOTE — Patient Instructions (Signed)
Screening for Sleep Apnea  Sleep apnea is a condition in which breathing pauses or becomes shallow during sleep. Sleep apnea screening is a test to determine if you are at risk for sleep apnea. The test is easy and only takes a few minutes. Your health care provider may ask you to have this test in preparation for surgery or as part of a physical exam. What are the symptoms of sleep apnea? Common symptoms of sleep apnea include:  Snoring.  Restless sleep.  Daytime sleepiness.  Pauses in breathing.  Choking during sleep.  Irritability.  Forgetfulness.  Trouble thinking clearly.  Depression.  Personality changes. Most people with sleep apnea are not aware that they have it. Why should I get screened? Getting screened for sleep apnea can help:  Ensure your safety. It is important for your health care providers to know whether or not you have sleep apnea, especially if you are having surgery or have other long-term (chronic) health conditions.  Improve your health and allow you to get a better night's rest. Restful sleep can help you: ? Have more energy. ? Lose weight. ? Improve high blood pressure. ? Improve diabetes management. ? Prevent stroke. ? Prevent car accidents. How is screening done? Screening usually includes being asked a list of questions about your sleep quality. Some questions you may be asked include:  Do you snore?  Is your sleep restless?  Do you have daytime sleepiness?  Has a partner or spouse told you that you stop breathing during sleep?  Have you had trouble concentrating or memory loss? If your screening test is positive, you are at risk for the condition. Further testing may be needed to confirm a diagnosis of sleep apnea. Where to find more information You can find screening tools online or at your health care clinic. For more information about sleep apnea screening and healthy sleep, visit these websites:  Centers for Disease Control and  Prevention: www.cdc.gov/sleep/index.html  American Sleep Apnea Association: www.sleepapnea.org Contact a health care provider if:  You think that you may have sleep apnea. Summary  Sleep apnea screening can help determine if you are at risk for sleep apnea.  It is important for your health care providers to know whether or not you have sleep apnea, especially if you are having surgery or have other chronic health conditions.  You may be asked to take a screening test for sleep apnea in preparation for surgery or as part of a physical exam. This information is not intended to replace advice given to you by your health care provider. Make sure you discuss any questions you have with your health care provider. Document Revised: 01/10/2018 Document Reviewed: 07/06/2016 Elsevier Patient Education  2020 Elsevier Inc. Quality Sleep Information, Adult Quality sleep is important for your mental and physical health. It also improves your quality of life. Quality sleep means you:  Are asleep for most of the time you are in bed.  Fall asleep within 30 minutes.  Wake up no more than once a night.  Are awake for no longer than 20 minutes if you do wake up during the night. Most adults need 7-8 hours of quality sleep each night. How can poor sleep affect me? If you do not get enough quality sleep, you may have:  Mood swings.  Daytime sleepiness.  Confusion.  Decreased reaction time.  Sleep disorders, such as insomnia and sleep apnea.  Difficulty with: ? Solving problems. ? Coping with stress. ? Paying attention. These issues may   affect your performance and productivity at work, school, and at home. Lack of sleep may also put you at higher risk for accidents, suicide, and risky behaviors. If you do not get quality sleep you may also be at higher risk for several health problems, including:  Infections.  Type 2 diabetes.  Heart disease.  High blood  pressure.  Obesity.  Worsening of long-term conditions, like arthritis, kidney disease, depression, Parkinson's disease, and epilepsy. What actions can I take to get more quality sleep?      Stick to a sleep schedule. Go to sleep and wake up at about the same time each day. Do not try to sleep less on weekdays and make up for lost sleep on weekends. This does not work.  Try to get about 30 minutes of exercise on most days. Do not exercise 2-3 hours before going to bed.  Limit naps during the day to 30 minutes or less.  Do not use any products that contain nicotine or tobacco, such as cigarettes or e-cigarettes. If you need help quitting, ask your health care provider.  Do not drink caffeinated beverages for at least 8 hours before going to bed. Coffee, tea, and some sodas contain caffeine.  Do not drink alcohol close to bedtime.  Do not eat large meals close to bedtime.  Do not take naps in the late afternoon.  Try to get at least 30 minutes of sunlight every day. Morning sunlight is best.  Make time to relax before bed. Reading, listening to music, or taking a hot bath promotes quality sleep.  Make your bedroom a place that promotes quality sleep. Keep your bedroom dark, quiet, and at a comfortable room temperature. Make sure your bed is comfortable. Take out sleep distractions like TV, a computer, smartphone, and bright lights.  If you are lying awake in bed for longer than 20 minutes, get up and do a relaxing activity until you feel sleepy.  Work with your health care provider to treat medical conditions that may affect sleeping, such as: ? Nasal obstruction. ? Snoring. ? Sleep apnea and other sleep disorders.  Talk to your health care provider if you think any of your prescription medicines may cause you to have difficulty falling or staying asleep.  If you have sleep problems, talk with a sleep consultant. If you think you have a sleep disorder, talk with your health  care provider about getting evaluated by a specialist. Where to find more information  National Sleep Foundation website: https://sleepfoundation.org  National Heart, Lung, and Blood Institute (NHLBI): www.nhlbi.nih.gov/files/docs/public/sleep/healthy_sleep.pdf  Centers for Disease Control and Prevention (CDC): www.cdc.gov/sleep/index.html Contact a health care provider if you:  Have trouble getting to sleep or staying asleep.  Often wake up very early in the morning and cannot get back to sleep.  Have daytime sleepiness.  Have daytime sleep attacks of suddenly falling asleep and sudden muscle weakness (narcolepsy).  Have a tingling sensation in your legs with a strong urge to move your legs (restless legs syndrome).  Stop breathing briefly during sleep (sleep apnea).  Think you have a sleep disorder or are taking a medicine that is affecting your quality of sleep. Summary  Most adults need 7-8 hours of quality sleep each night.  Getting enough quality sleep is an important part of health and well-being.  Make your bedroom a place that promotes quality sleep and avoid things that may cause you to have poor sleep, such as alcohol, caffeine, smoking, and large meals.  Talk to   your health care provider if you have trouble falling asleep or staying asleep. This information is not intended to replace advice given to you by your health care provider. Make sure you discuss any questions you have with your health care provider. Document Revised: 07/03/2017 Document Reviewed: 07/03/2017 Elsevier Patient Education  2020 Elsevier Inc.  

## 2020-02-02 NOTE — Addendum Note (Signed)
Addended by: Melvyn Novas on: 02/02/2020 09:49 AM   Modules accepted: Orders

## 2020-02-02 NOTE — Progress Notes (Signed)
SLEEP MEDICINE CLINIC   Provider:  Melvyn Novas, M D  Referring Provider: Wanda Plump, MD Primary Care Physician:  Wanda Plump, MD  Chief Complaint  Patient presents with  . New Patient (Initial Visit)    Room 10. Alone. Last seen for OSA and migraines 3+ years ago. She has not been wearing her CPAP machine due to the mask being uncomfortable. She did not pursue getting a different type of mask. She is only getting one migraine every couple of months now.     HPI:  Leslie Rice is a 54 y.o. female , seen here as a referral from Dr. Drue Novel for a new sleep Consultation- Former patient of Dr Sandria Manly for [employer] migraines. Last seen 01-2017- and at the time diagnosed with OSA and started on CPAP- but she soon felt it had no benefits, no improvement. She feels now fatigued again, doesn't rest well and is not restored in the morning, but a habitual mouth breather. She has been told by her bed partner her snoring is thunderous! And he observed apneas. She has no longer migraines - on Ajovy- a positive development.  She is still at the same , stressful ,job- working as IT trainer.       Last note from 01-25-2016. Leslie Rice reports that she was diagnosed with sleep apnea approximately 10 years ago, she actively pursued weight loss as treatment options and was successful for about 5 years has started to regain weight back. She is not back to her weight but was present at the time of diagnosis. Recently her sister had stayed with her at the house and witnessed her to snore heavily but also witnessed apneas again. Leslie Rice reported this to Dr. Darrin Nipper who would like her to be reevaluated for possible sleep apnea. Besides the weight gain she endorsed a decreased level of daytime energy, increased daytime sleepiness, and memory difficulties. She has a history of migraine and depression, pulmonary emboli-but she has family members affected by factor V Leiden she was tested negative  repeatedly. At the time of PE she was on hormonal birth control.  She was placed on Xeralto  and oxygen at night. Had also carpal tunnel surgery and C-section. She is a mother of 2 daughters 68 and 59.  Chief complaint according to patient : " snoring and pausing my breath "  Sleep habits are as follows: The patient has an early bedtime habits she usually goes to bed between 8 and 9 PM and she falls asleep promptly. She feels exhausted throughout the afternoon and early evening which leads to the early bedtime. She does  watch TV in bed, but goes to sleep nonetheless. She wakes up at least once when she goes to the bathroom, but usually goes back to sleep quickly. She sleeps on one pillow and usually on her left side and her bedroom is usually quiet and cool (once she switches of the TV). She has to rise usually around 6:30 AM but often wakes up at 5 and sometimes wishes to sleep longer but can't. She has a very dry mouth at night and in the morning. Some mornings she will have a headache when waking, she has a headache today.  There is no nausea with these sleep related headaches. She also has migraine , only once in 3 month now.    Her insurance did not pay for treatment of sleep apnea 14 years ago and she advised me that her insurance will  change again on August 1 for this reason I would like her to go into the sleep study ASAP.   Sleep medical history and family sleep history:  Sister has OSA on CPAP. All family members snore , she is the youngest of 59. Farming family, german father one of eight ,mother one of 7.   Social history: Divorced, 2 daughters 35 now 41 and 3 years old, daytime worker- but constantly on call, never feels off. Never used tobacco, ETOH : rarely- cay using headaches, caffeine: no coffee, but some soda 1 a day , no tea.     Review of Systems: Out of a complete 14 system review, the patient complains of only the following symptoms, and all other reviewed systems are  negative.   Snoring, witnessed apnea.   How likely are you to doze in the following situations: 0 = not likely, 1 = slight chance, 2 = moderate chance, 3 = high chance  Sitting and Reading? Watching Television? Sitting inactive in a public place (theater or meeting)? Lying down in the afternoon when circumstances permit? Sitting and talking to someone? Sitting quietly after lunch without alcohol? In a car, while stopped for a few minutes in traffic? As a passenger in a car for an hour without a break?  Total = 10     Epworth score now up to 10 from  8/ 24  , Fatigue severity score  Up to 63 from 44/ 63 - very  high  , depression score improved over the last 2 years - counseling. On armodafinil. She reports better concentration and productivity. Sleeps  8-10 hours daily! Still not refreshed, not restored.    Social History   Socioeconomic History  . Marital status: Single    Spouse name: Not on file  . Number of children: 2  . Years of education: some college  . Highest education level: Not on file  Occupational History  . 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 Smoking status: Never Smoker  . Smokeless tobacco: Never Used  Vaping Use  . Vaping Use: Never used  Substance and Sexual Activity  . Alcohol use: Yes    Comment: occ  . Drug use: No  . Sexual activity: Not Currently  Other Topics Concern  . Not on file  Social History Narrative   Live by herself   One graduated from [employer], lives in [place]   Right-handed.   No daily use of caffeine.          Social Determinants of Health   Financial Resource Strain:   . Difficulty of Paying Living Expenses: Not on file  Food Insecurity:   . Worried About Programme researcher, broadcasting/film/video in the Last Year: Not on file  . Ran Out of Food in the Last Year: Not on file  Transportation Needs:   . Lack of Transportation (Medical): Not on file  . Lack of Transportation (Non-Medical): Not on file  Physical Activity:   .  Days of Exercise per Week: Not on file  . Minutes of Exercise per Session: Not on file  Stress:   . Feeling of Stress : Not on file  Social Connections:   . Frequency of Communication with Friends and Family: Not on file  . Frequency of Social Gatherings with Friends and Family: Not on file  . Attends Religious Services: Not on file  . Active Member of Clubs or Organizations: Not on file  . Attends Banker Meetings: Not  on file  . Marital Status: Not on file  Intimate Partner Violence:   . Fear of Current or Ex-Partner: Not on file  . Emotionally Abused: Not on file  . Physically Abused: Not on file  . Sexually Abused: Not on file    Family History  Problem Relation Age of Onset  . Colon cancer Other        M, aunt, nephew  . CAD Father        age? "silent MIs"  . Lung cancer Father   . Diabetes Father        F, sisters x2  . Thyroid cancer Sister   . Colon cancer Sister        dx age 98  . Breast cancer Neg Hx   . Stroke Neg Hx     Past Medical History:  Diagnosis Date  . *Depression 07/21/2008   Used to see a Veterinary surgeon. Meds RF by previous PCP    . B12 deficiency 07/21/2008   Qualifier: Diagnosis of  By: Koleen Distance CMA (AAMA), Hulan Saas    . Chronic pain syndrome    thoracic back pain, seeing Dr. Ethelene Hal  . DEPRESSION   . DVT (deep venous thrombosis) (HCC)   . Edema 07/21/2008   Qualifier: Diagnosis of  By: Koleen Distance CMA (AAMA), Hulan Saas    . FATIGUE 07/21/2008   Qualifier: Diagnosis of  By: Koleen Distance CMA (AAMA), Hulan Saas    . GERD   . Hyperlipidemia 07/21/2008   Intolerant to simvastatin and Pravachol   . Hypersomnia with sleep apnea 10/20/2015  . Insomnia 07/21/2008   Qualifier: Diagnosis of  By: Koleen Distance CMA (AAMA), Hulan Saas    . Irritable bowel syndrome   . Migraine headache 07/21/2008   Menstrual related, used to take HRT, now unable to due to clots Used to see Dr Sandria Manly     . Morbid obesity due to excess calories (HCC) 10/20/2015  . OSA (obstructive sleep apnea)    not  on Cpap @ present 11-2013  . Primary hypercoagulable state (HCC)   . PULMONARY EMBOLISM, HX OF 2002 07/21/2008   Qualifier: Diagnosis of  By: Koleen Distance CMA (AAMA), Hulan Saas    . Seasonal allergies 07/26/2015  . Sleep related headaches 10/20/2015  . Vitamin D deficiency 07/21/2008   Qualifier: Diagnosis of  By: Koleen Distance CMA Duncan Dull), Hulan Saas      Past Surgical History:  Procedure Laterality Date  . c section     x 2   . CARPAL TUNNEL RELEASE     B  . HYSTEROSCOPY W/ ENDOMETRIAL ABLATION  01/16/2019  . NASAL SINUS SURGERY      Current Outpatient Medications  Medication Sig Dispense Refill  . ALPRAZolam (XANAX) 0.5 MG tablet Take 1 tablet (0.5 mg total) by mouth 3 (three) times daily as needed. 90 tablet 2  . Armodafinil 250 MG tablet Take 1 tablet (250 mg total) by mouth daily. 30 tablet 2  . carvedilol (COREG) 6.25 MG tablet Take 1 tablet by mouth twice daily 180 tablet 1  . Cholecalciferol (VITAMIN D) 50 MCG (2000 UT) CAPS Take 1 capsule by mouth.    . Cyanocobalamin (VITAMIN B 12 PO) Take 1 capsule by mouth.    Marland Kitchen FLUoxetine (PROZAC) 20 MG capsule Take 1 capsule (20 mg total) by mouth daily. 90 capsule 1  . HYDROcodone-acetaminophen (NORCO) 10-325 MG per tablet Take 1 tablet by mouth every 6 (six) hours as needed for moderate pain.     . hydrOXYzine (ATARAX/VISTARIL) 25 MG tablet Take  1 tablet (25 mg total) by mouth 3 (three) times daily as needed. 270 tablet 1  . lurasidone (LATUDA) 40 MG TABS tablet Take 1 tablet (40 mg total) by mouth daily with breakfast. 90 tablet 1  . promethazine (PHENERGAN) 25 MG tablet Take 1 tablet (25 mg total) by mouth every 6 (six) hours as needed for nausea or vomiting. 30 tablet 1  . rivaroxaban (XARELTO) 20 MG TABS tablet Take 1 tablet (20 mg total) by mouth daily with supper. 90 tablet 3  . SF 5000 PLUS 1.1 % CREA dental cream     . SUMAtriptan (IMITREX) 50 MG tablet One tablet by mouth at start of migraine.  May repeat in 2 hours if no improvement x 1 dose. 10  tablet 2   No current facility-administered medications for this visit.    Allergies as of 02/02/2020 - Review Complete 02/02/2020  Allergen Reaction Noted  . Ciprofloxacin    . Codeine    . Sulfonamide derivatives      Vitals: BP 112/76   Pulse 69   Ht 5\' 3"  (1.6 m)   Wt 223 lb (101.2 kg)   BMI 39.50 kg/m  Last Weight:  Wt Readings from Last 1 Encounters:  02/02/20 223 lb (101.2 kg)   EAV:WUJWBMI:Body mass index is 39.5 kg/m.     Last Height:   Ht Readings from Last 1 Encounters:  02/02/20 5\' 3"  (1.6 m)    Physical exam:  General: The patient is awake, alert and appears not in acute distress. The patient is well groomed. Head: Normocephalic, atraumatic. Neck is supple. Mallampati 3, lateral crowding and peaked palate. neck circumference:1&. Nasal airflow patent ,  TMJ click is not evident . Prognathia is seen. Status post sinus surgery.  Cardiovascular:  Regular rate and rhythm, without  murmurs or carotid bruit, and without distended neck veins. Respiratory: Lungs are clear to auscultation. Skin:  Without evidence of edema, or rash Trunk: BMI is  39.5 . The patient's posture is erect.  Neurologic exam : The patient is awake and alert, oriented to place and time.   Memory subjective described as intact.  Attention span & concentration ability appears normal.  Speech is fluent,  without  dysarthria, nasal dysphonia or aphasia.  Mood and affect are appropriate.  Cranial nerves: Pupils are equal and briskly reactive to light. Funduscopic exam without evidence of pallor or edema. Extraocular movements  in vertical and horizontal planes intact and without nystagmus. Visual fields by finger perimetry are intact. Hearing to finger rub intact.  Facial sensation intact to fine touch. Facial motor strength is symmetric and tongue and and her uvula move midline. Shoulder shrug was symmetrical.   Motor exam: Normal tone, muscle bulk and symmetric strength in all  extremities.  Sensory:  Fine touch and vibration were tested in all extremities. Proprioception tested in the upper extremities was normal.  Coordination: Rapid alternating movements in the fingers/hands was normal. No changes in penmanship.   Finger-to-nose maneuver  normal without evidence of ataxia, dysmetria or tremor.  Gait and station: Patient walks without assistive device .  Stance is stable and normal.  Tandem gait is unfragmented. Turns with  3 Steps.  Deep tendon reflexes: in the  upper and lower extremities are symmetric and intact. Babinski maneuver response is downgoing.   The last time I encountered Leslie Rice we were ordering a home sleep test for her based on her insurance guidelines.  The study resulted in a total recording time of  556 minutes with an AHI of 14.9/h and a larger higher snoring index of 17.6/h.  She had 32.6 minutes of desaturations, the trend to bradycardia was noted the home sleep test at the time could not distinguish between non-REM and REM sleep and truly did not distinguish between sleep time of recording time.  The patient was placed on CPAP after being titrated the same pressure was 16 cmH2O with 3 cm EPR and her AHI was 1.3.  However she felt that there was no improvement in her overall fatigue and sleepiness, she still woke unrefreshed and and restored.  Her headaches have minimally controlled by Ajovy and sleep-related headaches are very rare now.  She does still struggle with some sinus congestion in spite of having had sinus surgery.  She had compliantly used the machine in 2017 and early 2018.  She is currently on Ajovy for her headaches, her GERD has been controlled she has hyperlipidemia, fatigue has been treated with armodafinil, she is seeing Dr. Modesta Messing for chronic thoracic back pain.  In the past she had a B12 deficiency which has also been supplemented.  She is status post carpal tunnel release hysterectomy and nasal sinus surgery.  She was diagnosed  with mild OSA- I would recommend to repeat HST by Apnea watch pat.  She would prefer a N30 I mask if CPAP is still needed.    The patient was advised of the nature of the diagnosed sleep disorder , the treatment options and risks for general a health and wellness arising from not treating the condition.  I spent more than 30 minutes of face to face time with the patient.We have discussed the diagnosis and differential and I answered the patient's questions.     Assessment:  After physical and neurologic examination, review of laboratory studies,  Personal review of imaging studies, reports of other /same  Imaging studies ,  Results of polysomnography/ neurophysiology testing and pre-existing records as far as provided in visit., my assessment is   1)  Need to establish baseline again, [employer] will only endorse HST.     Plan:  Treatment plan and additional workup : HST ordered, will need to look at REM versus NREM sleep apnea indicis and try a new mask that covers her mouth but not her nose.    Porfirio Mylar Kerwin Augustus MD  02/02/2020   CC: Wanda Plump, Md [address] [place],  [place] [address]

## 2020-02-04 ENCOUNTER — Other Ambulatory Visit: Payer: Self-pay | Admitting: Adult Health

## 2020-02-05 IMAGING — CT CT ANGIO CHEST
2 of 8 series · 19 of 36 positions shown · IV contrast (iopamidol)
Comparison: Report of CT scan dated 12/07/2008

CLINICAL DATA: Chest pain and shortness of breath. History of
pulmonary emboli.

EXAM:
CT ANGIOGRAPHY CHEST WITH CONTRAST
TECHNIQUE: Multidetector CT imaging of the chest was performed using the
standard protocol during bolus administration of intravenous
contrast. Multiplanar CT image reconstructions and MIPs were
obtained to evaluate the vascular anatomy.
CONTRAST:  100mL SACA5O-JQW IOPAMIDOL (SACA5O-JQW) INJECTION 76%

[Series 6: pe coronal mpr · coronal · 0.55mm/px · 1 of 152 slices shown]
[im 76/152  mediastinal]
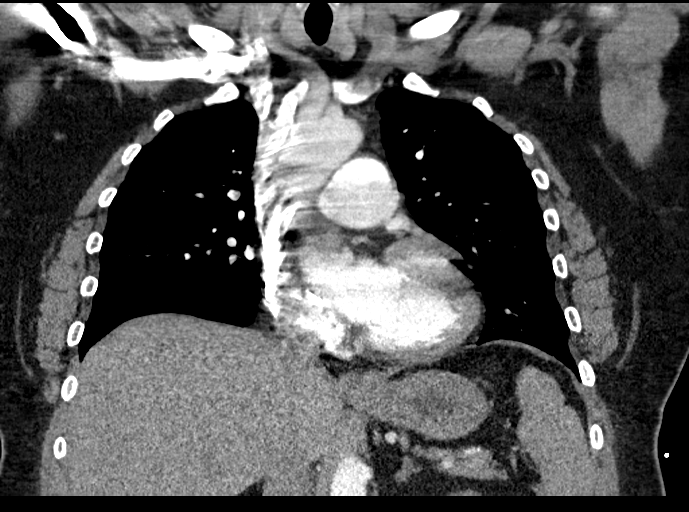

[Series 10: pe thins · axial · 0.72mm/px · z∈[-295,-56]mm · 18 of 269 slices shown]
[im 15/269  lung]
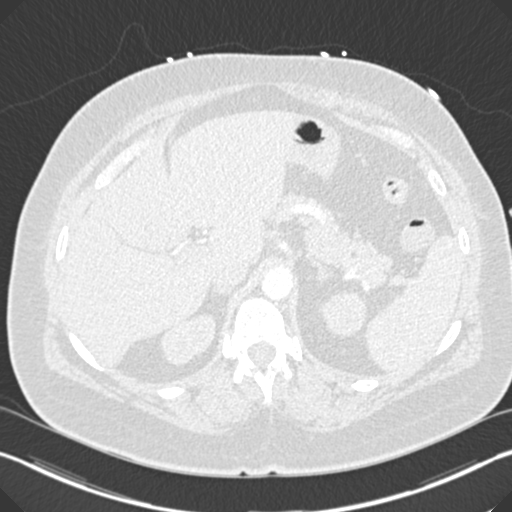
[im 29/269  mediastinal]
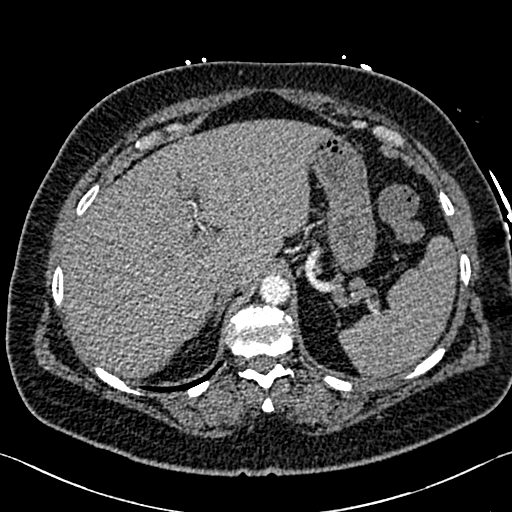
[im 43/269  lung]
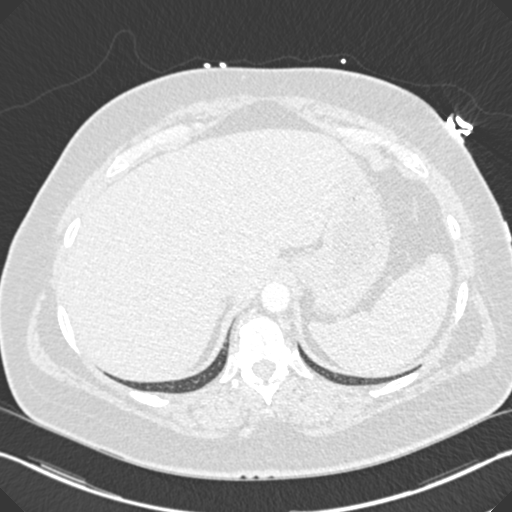
[im 57/269  mediastinal]
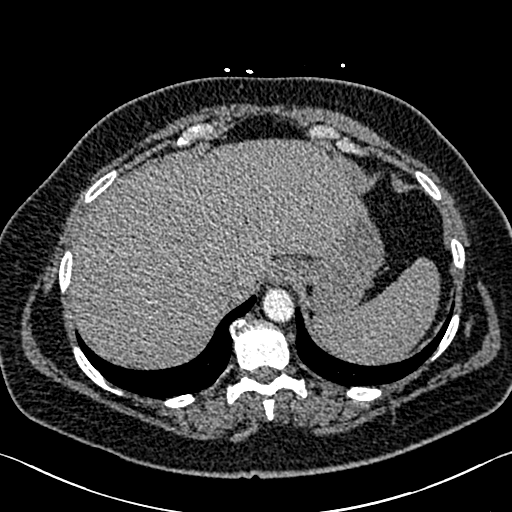
[im 71/269  lung]
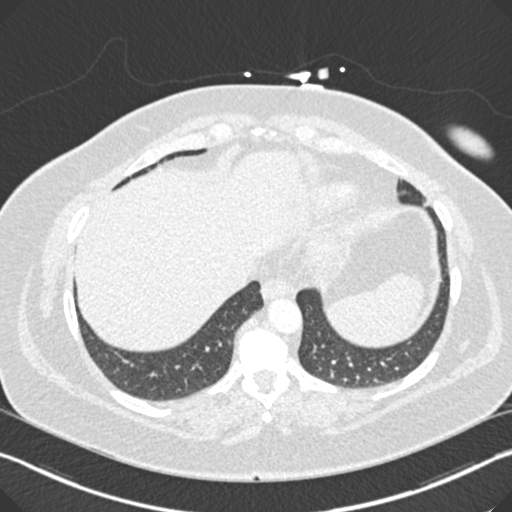
[im 85/269  mediastinal]
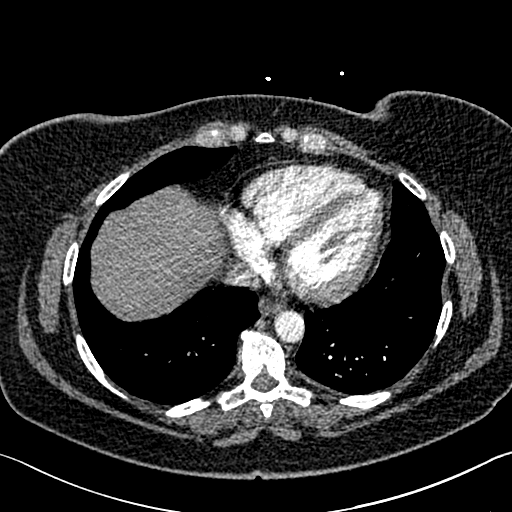
[im 99/269  lung]
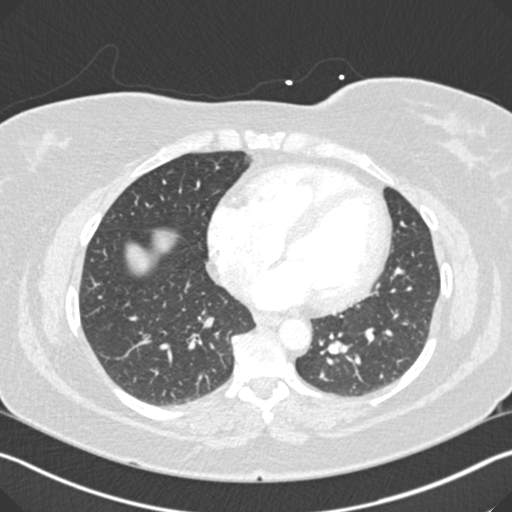
[im 113/269  mediastinal]
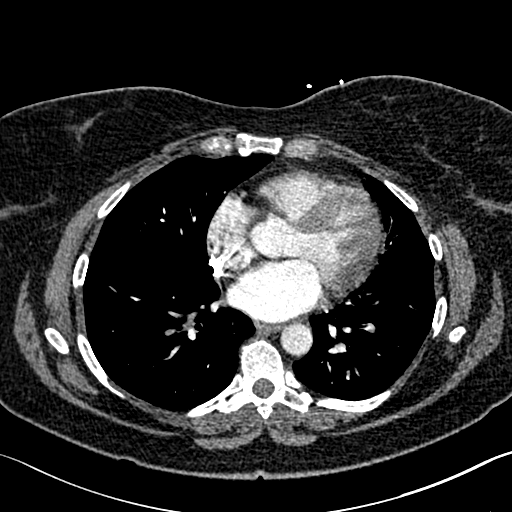
[im 127/269  lung]
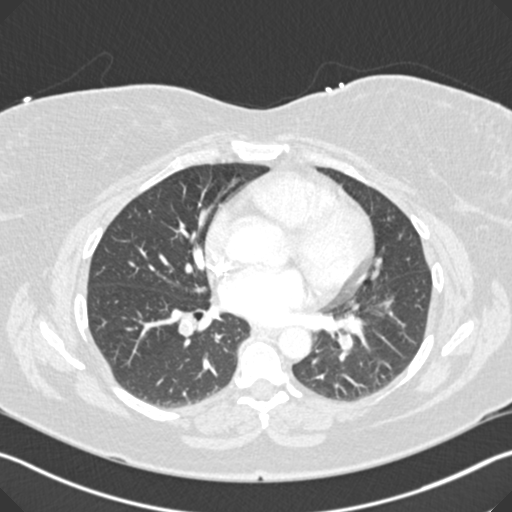
[im 142/269  mediastinal]
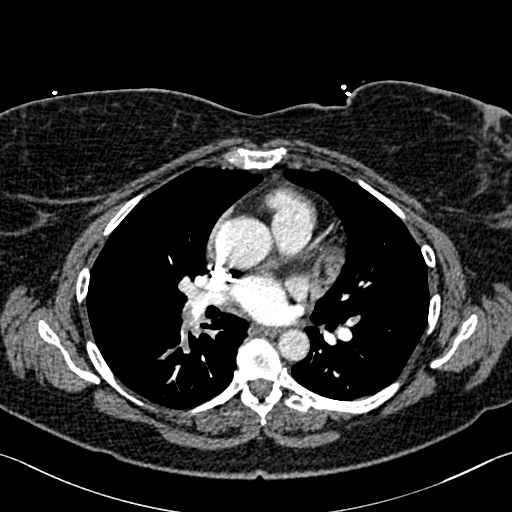
[im 156/269  lung]
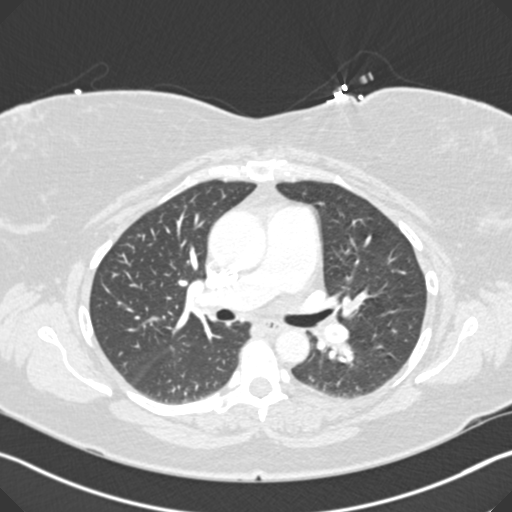
[im 170/269  mediastinal]
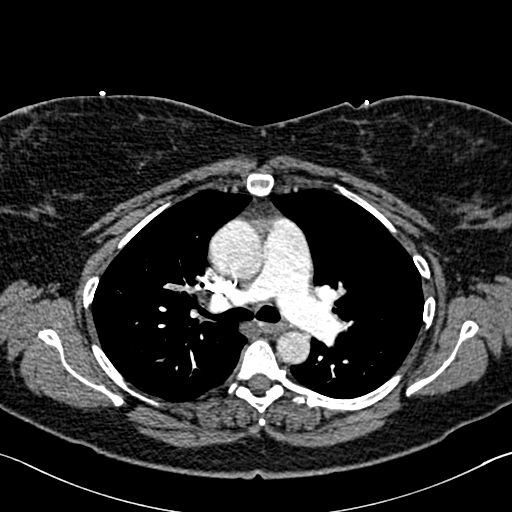
[im 184/269  lung]
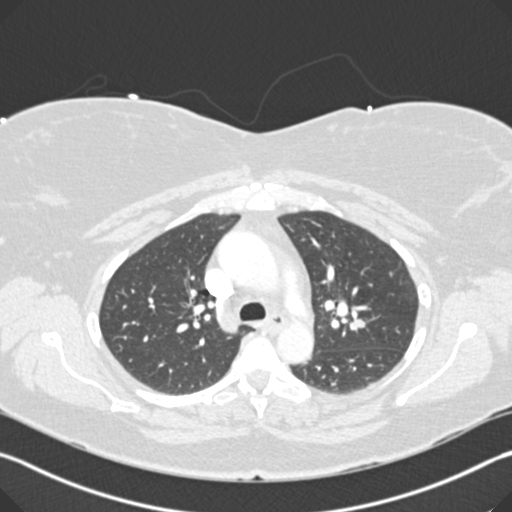
[im 198/269  mediastinal]
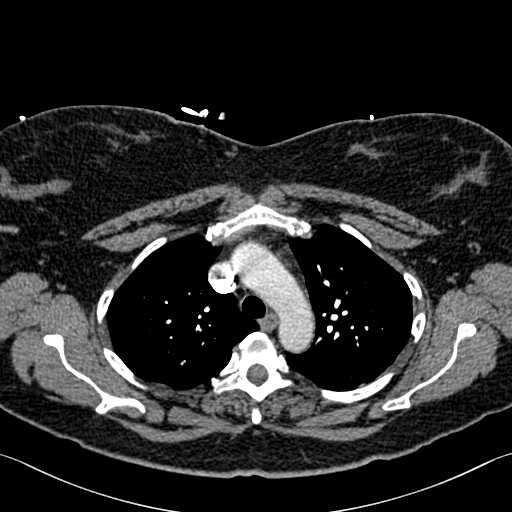
[im 212/269  lung]
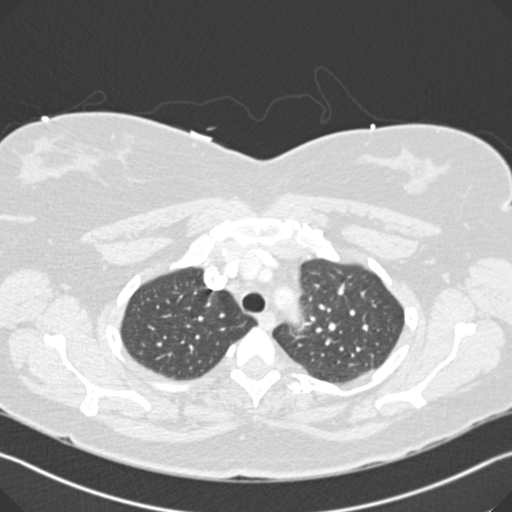
[im 226/269  mediastinal]
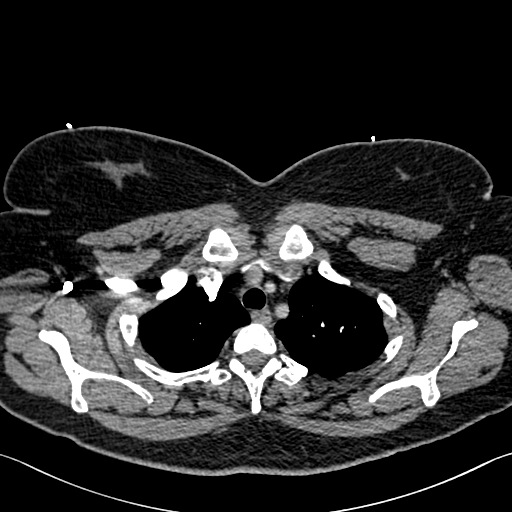
[im 240/269  lung]
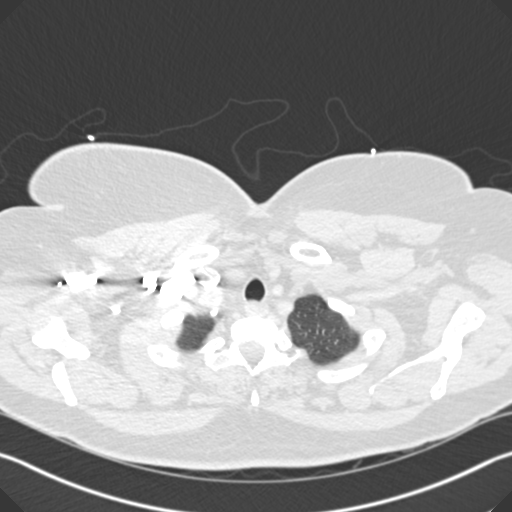
[im 254/269  mediastinal]
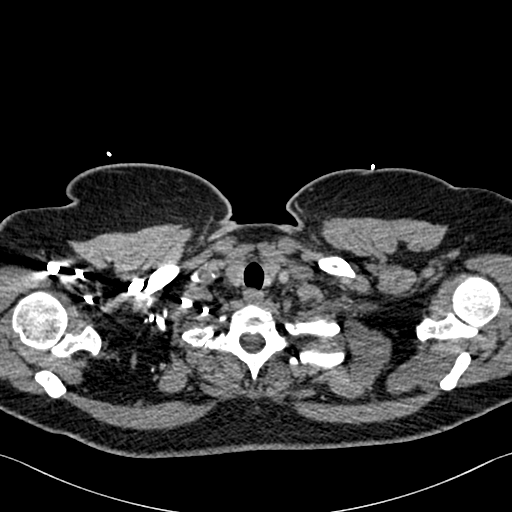

[19 of 36 positions shown; findings below may reference images not displayed]

FINDINGS: Cardiovascular: There multiple bilateral pulmonary emboli involving
all lobes of both lungs. RV LV ratio is 1.0. Heart size is normal.
No pericardial effusion.

Mediastinum/Nodes: No enlarged mediastinal, hilar, or axillary lymph
nodes. Thyroid gland, trachea, and esophagus demonstrate no
significant findings.

Lungs/Pleura: Lungs are clear. No pleural effusion or pneumothorax.

Upper Abdomen: Normal.

Musculoskeletal: No chest wall abnormality. No acute or significant
osseous findings.

Review of the MIP images confirms the above findings.
IMPRESSION: Extensive bilateral pulmonary emboli. Slightly elevated RV/LV ratio.

Critical Value/emergent results were called by telephone at the time
of interpretation on 03/17/2018 at [DATE] to Dr. YOSNIEL YUNG
, who verbally acknowledged these results.

## 2020-02-16 ENCOUNTER — Other Ambulatory Visit: Payer: Self-pay | Admitting: Adult Health

## 2020-02-16 DIAGNOSIS — F411 Generalized anxiety disorder: Secondary | ICD-10-CM

## 2020-02-16 DIAGNOSIS — G4700 Insomnia, unspecified: Secondary | ICD-10-CM

## 2020-03-09 ENCOUNTER — Ambulatory Visit (INDEPENDENT_AMBULATORY_CARE_PROVIDER_SITE_OTHER): Payer: Managed Care, Other (non HMO) | Admitting: Neurology

## 2020-03-09 DIAGNOSIS — J302 Other seasonal allergic rhinitis: Secondary | ICD-10-CM

## 2020-03-09 DIAGNOSIS — G4733 Obstructive sleep apnea (adult) (pediatric): Secondary | ICD-10-CM

## 2020-03-09 DIAGNOSIS — Z7901 Long term (current) use of anticoagulants: Secondary | ICD-10-CM

## 2020-03-09 DIAGNOSIS — G478 Other sleep disorders: Secondary | ICD-10-CM

## 2020-03-09 DIAGNOSIS — I2699 Other pulmonary embolism without acute cor pulmonale: Secondary | ICD-10-CM

## 2020-03-23 DIAGNOSIS — G478 Other sleep disorders: Secondary | ICD-10-CM | POA: Insufficient documentation

## 2020-03-23 NOTE — Progress Notes (Signed)
   [facility]  HOME SLEEP TEST (Watch PAT)  STUDY DATE: 03/09/20/ data loaded 03-16-2020  DOB: [birth year]  MRN: [id number]  ORDERING CLINICIAN: Melvyn Novas, MD   REFERRING CLINICIAN: Wanda Plump, MD   CLINICAL INFORMATION/HISTORY: Leslie Rice is a 54 y.o. female , seen here as a referral from Dr. Drue Novel for a new sleep Consultation- Former patient of Dr Valinda Party for katamenial migraines. Last seen 01-2017- and at the time diagnosed with OSA and started on CPAP- but she soon felt it had no benefits, no improvement. She feels now fatigued again, doesn't rest well and is not restored in the morning, but a habitual mouth breather. She has been told by her bed partner her snoring is thunderous! And he observed apneas. She has no longer migraines - on Ajovy- a positive development.  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  Epworth sleepiness score: 8/24.  BMI: 39.5 kg/m  FINDINGS:   Total Record Time (hours, min): 7 h13 min  Total Sleep Time (hours, min):  6 h 46 min   Percent REM (%):    25.21 %   Calculated pAHI (per hour): 3.7       REM pAHI:   6.5     NREM pAHI:  2.8  Supine AHI: N/A   Oxygen Saturation (%) Mean: 95  Minimum oxygen saturation (%):        90   O2 Saturation Range (%): 49-83  O2Saturation (minutes) <=88%: 0 min  Pulse Mean (bpm):    61  Pulse Range (49-83)   IMPRESSION: Clinically insignificant degree of OSA (obstructive sleep apnea) with AHI under 5/h. No positional data were available. No hypoxemia in sleep was noted.     RECOMMENDATION:  No intervention form sleep medical standpoint needed.    INTERPRETING PHYSICIAN:  Melvyn Novas, MD

## 2020-03-23 NOTE — Progress Notes (Signed)
IMPRESSION: Clinically insignificant degree of OSA (obstructive sleep apnea) with AHI under 5/h. No positional data were available. No hypoxemia in sleep was noted.     RECOMMENDATION:  No intervention is needed from sleep-medical stand point .    INTERPRETING PHYSICIAN:  Porfirio Mylar Hadlyn Amero,MD

## 2020-03-24 ENCOUNTER — Telehealth: Payer: Self-pay | Admitting: Neurology

## 2020-03-24 NOTE — Telephone Encounter (Signed)
-----   Message from Melvyn Novas, MD sent at 03/23/2020  7:31 PM EST ----- IMPRESSION: Clinically insignificant degree of OSA (obstructive sleep apnea) with AHI under 5/h. No positional data were available. No hypoxemia in sleep was noted.     RECOMMENDATION:  No intervention is needed from sleep-medical stand point .    INTERPRETING PHYSICIAN:  Porfirio Mylar Dohmeier,MD

## 2020-03-24 NOTE — Telephone Encounter (Signed)
Called the patient and reviewed the results. Informed her that this HST indicated there was minimal apnea present. The AHI events were under 5. Informed the patient that oxygen level and heart rate also stayed in normal range. Informed the patient no sleep intervention is needed at this time. Pt states that she will continue to follow up yearly for migraine management. Apt made for oct 2022 with Butch Penny, NP

## 2020-04-18 ENCOUNTER — Other Ambulatory Visit: Payer: Self-pay | Admitting: Adult Health

## 2020-04-18 DIAGNOSIS — F411 Generalized anxiety disorder: Secondary | ICD-10-CM

## 2020-04-18 DIAGNOSIS — F3181 Bipolar II disorder: Secondary | ICD-10-CM

## 2020-04-18 DIAGNOSIS — F331 Major depressive disorder, recurrent, moderate: Secondary | ICD-10-CM

## 2020-05-16 ENCOUNTER — Telehealth: Payer: Self-pay | Admitting: Adult Health

## 2020-05-16 ENCOUNTER — Other Ambulatory Visit: Payer: Self-pay | Admitting: Adult Health

## 2020-05-16 DIAGNOSIS — G4700 Insomnia, unspecified: Secondary | ICD-10-CM

## 2020-05-16 DIAGNOSIS — F411 Generalized anxiety disorder: Secondary | ICD-10-CM

## 2020-05-16 NOTE — Telephone Encounter (Signed)
Scripts already sent

## 2020-05-16 NOTE — Telephone Encounter (Signed)
[facility] pharmacy is going to be sending a refill request on Armodafinil, Hydroxyzine and Alprazolam. Leslie Rice wanted me to inform you she will be going out of town on Wednesday morning and needs them before this date. Next appt is 06/01/20.

## 2020-06-01 ENCOUNTER — Telehealth (INDEPENDENT_AMBULATORY_CARE_PROVIDER_SITE_OTHER): Payer: 59 | Admitting: Adult Health

## 2020-06-01 ENCOUNTER — Encounter: Payer: Self-pay | Admitting: Adult Health

## 2020-06-01 DIAGNOSIS — F331 Major depressive disorder, recurrent, moderate: Secondary | ICD-10-CM

## 2020-06-01 DIAGNOSIS — F3181 Bipolar II disorder: Secondary | ICD-10-CM | POA: Diagnosis not present

## 2020-06-01 DIAGNOSIS — G4700 Insomnia, unspecified: Secondary | ICD-10-CM | POA: Diagnosis not present

## 2020-06-01 DIAGNOSIS — F411 Generalized anxiety disorder: Secondary | ICD-10-CM | POA: Diagnosis not present

## 2020-06-01 DIAGNOSIS — G4733 Obstructive sleep apnea (adult) (pediatric): Secondary | ICD-10-CM

## 2020-06-01 MED ORDER — FLUOXETINE HCL 20 MG PO CAPS: 20.0000 mg | ORAL_CAPSULE | Freq: Every day | ORAL | 3 refills | Status: DC

## 2020-06-01 MED ORDER — HYDROXYZINE HCL 25 MG PO TABS: 25.0000 mg | ORAL_TABLET | Freq: Three times a day (TID) | ORAL | 3 refills | Status: DC | PRN

## 2020-06-01 MED ORDER — LURASIDONE HCL 40 MG PO TABS: 40.0000 mg | ORAL_TABLET | Freq: Every day | ORAL | 1 refills | Status: DC

## 2020-06-01 MED ORDER — ALPRAZOLAM 0.5 MG PO TABS: 0.5000 mg | ORAL_TABLET | Freq: Three times a day (TID) | ORAL | 2 refills | Status: DC | PRN

## 2020-06-01 MED ORDER — ARMODAFINIL 250 MG PO TABS: 250.0000 mg | ORAL_TABLET | Freq: Every day | ORAL | 2 refills | Status: DC

## 2020-06-01 NOTE — Progress Notes (Signed)
Leslie Rice [id number] 12/17/1965 55 y.o.  Virtual Visit via Video Note  I connected with pt @ on 06/01/20 at 12:00 PM EST by a video enabled telemedicine application and verified that I am speaking with the correct person using two identifiers.   I discussed the limitations of evaluation and management by telemedicine and the availability of in person appointments. The patient expressed understanding and agreed to proceed.  I discussed the assessment and treatment plan with the patient. The patient was provided an opportunity to ask questions and all were answered. The patient agreed with the plan and demonstrated an understanding of the instructions.   The patient was advised to call back or seek an in-person evaluation if the symptoms worsen or if the condition fails to improve as anticipated.  I provided 30 minutes of non-face-to-face time during this encounter.  The patient was located at home.  The provider was located at [facility].   Dorothyann Gibbs, NP   Subjective:   Patient ID:  Leslie Rice is a 55 y.o. (DOB 07/18/1965) female.  Chief Complaint: No chief complaint on file.   HPI Leslie Rice presents for follow-up of MDD, GAD, insomnia. BPD2.   Referred by PCP  Describes mood today as "ok". Pleasant. Mood symptoms - reports decreased depression, anxious and irritable at times. Stating "I lose my temper sometimes". Struggled when daughter moved to [place]. Stating "I'm doing better". Feels like Modafinil continues to work well for her. Stable interest and motivation. Taking medications as prescribed.  Energy levels stable. Active, does not have a regular exercise routine.  Enjoys some usual interests and activities. Single. Has 2 children 24 and 21. Oldest daughter in [place]. Younger daughter at [facility]. Mostly staying home.  Appetite adequate. Weight loss - 30 pounds. Sleeping less. Averages 4 hours with Hydroxyzine - then 2 more hours if  taking a Xanax. Focus and concentration improved with Provigil. Completing tasks. Managing aspects of household. 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  Denies SI or HI. Denies AH or VH.  Previously seen by: Valinda Hoar   Previous medications: Zoloft, Wellbutrin, Lexapro, Effexor  Review of Systems:  Review of Systems  Musculoskeletal: Negative for gait problem.  Neurological: Negative for tremors.  Psychiatric/Behavioral:       Please refer to HPI    Medications: I have reviewed the patient's current medications.  Current Outpatient Medications  Medication Sig Dispense Refill  . ALPRAZolam (XANAX) 0.5 MG tablet Take 1 tablet (0.5 mg total) by mouth 3 (three) times daily as needed. 90 tablet 2  . Armodafinil 250 MG tablet Take 1 tablet (250 mg total) by mouth daily. 30 tablet 2  . carvedilol (COREG) 6.25 MG tablet Take 1 tablet by mouth twice daily 180 tablet 1  . Cholecalciferol (VITAMIN D) 50 MCG (2000 UT) CAPS Take 1 capsule by mouth.    . Cyanocobalamin (VITAMIN B 12 PO) Take 1 capsule by mouth.    Marland Kitchen FLUoxetine (PROZAC) 20 MG capsule Take 1 capsule (20 mg total) by mouth daily. 90 capsule 3  . HYDROcodone-acetaminophen (NORCO) 10-325 MG per tablet Take 1 tablet by mouth every 6 (six) hours as needed for moderate pain.     . hydrOXYzine (ATARAX/VISTARIL) 25 MG tablet Take 1 tablet (25 mg total) by mouth 3 (three) times daily as needed. 270 tablet 3  . lurasidone (LATUDA) 40 MG TABS tablet Take 1 tablet (40 mg total) by mouth daily with breakfast. 90 tablet  1  . promethazine (PHENERGAN) 25 MG tablet Take 1 tablet (25 mg total) by mouth every 6 (six) hours as needed for nausea or vomiting. 30 tablet 1  . rivaroxaban (XARELTO) 20 MG TABS tablet Take 1 tablet (20 mg total) by mouth daily with supper. 90 tablet 3  . SF 5000 PLUS 1.1 % CREA dental cream     . SUMAtriptan (IMITREX) 50 MG tablet One tablet by mouth at start of migraine.  May repeat in 2 hours if no improvement x 1  dose. 10 tablet 2   No current facility-administered medications for this visit.    Medication Side Effects: None  Allergies:  Allergies  Allergen Reactions  . Ciprofloxacin     REACTION: vomiting  . Codeine     REACTION: vomiting  . Sulfonamide Derivatives     hives    Past Medical History:  Diagnosis Date  . *Depression 07/21/2008   Used to see a Veterinary surgeoncounselor. Meds RF by previous PCP    . B12 deficiency 07/21/2008   Qualifier: Diagnosis of  By: Koleen DistanceKowalk CMA (AAMA), Hulan SaasLeisha    . Chronic pain syndrome    thoracic back pain, seeing Dr. Ethelene Halamos  . DEPRESSION   . DVT (deep venous thrombosis) (HCC)   . Edema 07/21/2008   Qualifier: Diagnosis of  By: Koleen DistanceKowalk CMA (AAMA), Hulan SaasLeisha    . FATIGUE 07/21/2008   Qualifier: Diagnosis of  By: Koleen DistanceKowalk CMA (AAMA), Hulan SaasLeisha    . GERD   . Hyperlipidemia 07/21/2008   Intolerant to simvastatin and Pravachol   . Hypersomnia with sleep apnea 10/20/2015  . Insomnia 07/21/2008   Qualifier: Diagnosis of  By: Koleen DistanceKowalk CMA (AAMA), Hulan SaasLeisha    . Irritable bowel syndrome   . Migraine headache 07/21/2008   Menstrual related, used to take HRT, now unable to due to clots Used to see Dr Sandria ManlyLove     . Morbid obesity due to excess calories (HCC) 10/20/2015  . OSA (obstructive sleep apnea)    not on Cpap @ present 11-2013  . Primary hypercoagulable state (HCC)   . PULMONARY EMBOLISM, HX OF 2002 07/21/2008   Qualifier: Diagnosis of  By: Koleen DistanceKowalk CMA (AAMA), Hulan SaasLeisha    . Seasonal allergies 07/26/2015  . Sleep related headaches 10/20/2015  . Vitamin D deficiency 07/21/2008   Qualifier: Diagnosis of  By: Koleen DistanceKowalk CMA Duncan Dull(AAMA), Hulan SaasLeisha      Family History  Problem Relation Age of Onset  . Colon cancer Other        M, aunt, nephew  . CAD Father        age? "silent MIs"  . Lung cancer Father   . Diabetes Father        F, sisters x2  . Thyroid cancer Sister   . Colon cancer Sister        dx age 53  . Breast cancer Neg Hx   . Stroke Neg Hx     Social History   Socioeconomic History   . Marital status: Single    Spouse name: Not on file  . Number of children: 2  . Years of education: some college  . Highest education level: Not on file  Occupational History  . 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 Smoking status: Never Smoker  . Smokeless tobacco: Never Used  Vaping Use  . Vaping Use: Never used  Substance and Sexual Activity  . Alcohol use: Yes    Comment: occ  . Drug use: No  .  Sexual activity: Not Currently  Other Topics Concern  . Not on file  Social History Narrative   Live by herself   One graduated from [employer], lives in [place]   Right-handed.   No daily use of caffeine.          Social Determinants of Health   Financial Resource Strain: Not on file  Food Insecurity: Not on file  Transportation Needs: Not on file  Physical Activity: Not on file  Stress: Not on file  Social Connections: Not on file  Intimate Partner Violence: Not on file    Past Medical History, Surgical history, Social history, and Family history were reviewed and updated as appropriate.   Please see review of systems for further details on the patient's review from today.   Objective:   Physical Exam:  There were no vitals taken for this visit.  Physical Exam Constitutional:      General: She is not in acute distress. Musculoskeletal:        General: No deformity.  Neurological:     Mental Status: She is alert and oriented to person, place, and time.     Coordination: Coordination normal.  Psychiatric:        Attention and Perception: Attention and perception normal. She does not perceive auditory or visual hallucinations.        Mood and Affect: Mood normal. Mood is not anxious or depressed. Affect is not labile, blunt, angry or inappropriate.        Speech: Speech normal.        Behavior: Behavior normal.        Thought Content: Thought content normal. Thought content is not paranoid or delusional. Thought content does not include homicidal or  suicidal ideation. Thought content does not include homicidal or suicidal plan.        Cognition and Memory: Cognition and memory normal.        Judgment: Judgment normal.     Comments: Insight intact     Lab Review:     Component Value Date/Time   NA 141 12/23/2019 0852   K 4.4 12/23/2019 0852   CL 107 12/23/2019 0852   CO2 24 12/23/2019 0852   GLUCOSE 94 12/23/2019 0852   BUN 12 12/23/2019 0852   CREATININE 0.85 12/23/2019 0852   CALCIUM 9.0 12/23/2019 0852   PROT 6.6 12/23/2019 0852   ALBUMIN 4.2 12/09/2018 0852   AST 14 12/23/2019 0852   ALT 12 12/23/2019 0852   ALKPHOS 68 12/09/2018 0852   BILITOT 0.6 12/23/2019 0852   GFRNONAA >60 03/18/2018 0347   GFRAA >60 03/18/2018 0347       Component Value Date/Time   WBC 4.6 12/23/2019 0852   RBC 4.48 12/23/2019 0852   HGB 13.8 12/23/2019 0852   HCT 40.5 12/23/2019 0852   PLT 229 12/23/2019 0852   MCV 90.4 12/23/2019 0852   MCH 30.8 12/23/2019 0852   MCHC 34.1 12/23/2019 0852   RDW 12.1 12/23/2019 0852   LYMPHSABS 1,357 12/23/2019 0852   MONOABS 0.2 03/11/2019 1107   EOSABS 83 12/23/2019 0852   BASOSABS 18 12/23/2019 0852    No results found for: POCLITH, LITHIUM   No results found for: PHENYTOIN, PHENOBARB, VALPROATE, CBMZ   .res Assessment: Plan:     Plan:  1. Prozac 20mg  daily 2. Hydroxyzine 25mg  tablet - 1 to 3 at bedtime  3. Xanax 0.25mg  TID prn anxiety  4. Latuda 20mg  daily 5. Armodafonil 250mg  daily - sleep apnea  Follow up with Neurologist - Nuvigil or Provigil   RTC 6 months  Patient advised to contact office with any questions, adverse effects, or acute worsening in signs and symptoms.  Discussed potential benefits, risk, and side effects of benzodiazepines to include potential risk of tolerance and dependence, as well as possible drowsiness.  Advised patient not to drive if experiencing drowsiness and to take lowest possible effective dose to minimize risk of dependence and  tolerance.  Discussed potential metabolic side effects associated with atypical antipsychotics, as well as potential risk for movement side effects. Advised pt to contact office if movement side effects occur.     Diagnoses and all orders for this visit:  Bipolar II disorder (HCC) -     lurasidone (LATUDA) 40 MG TABS tablet; Take 1 tablet (40 mg total) by mouth daily with breakfast. -     FLUoxetine (PROZAC) 20 MG capsule; Take 1 capsule (20 mg total) by mouth daily.  Insomnia, unspecified type -     hydrOXYzine (ATARAX/VISTARIL) 25 MG tablet; Take 1 tablet (25 mg total) by mouth 3 (three) times daily as needed.  Major depressive disorder, recurrent episode, moderate (HCC) -     FLUoxetine (PROZAC) 20 MG capsule; Take 1 capsule (20 mg total) by mouth daily.  Generalized anxiety disorder -     ALPRAZolam (XANAX) 0.5 MG tablet; Take 1 tablet (0.5 mg total) by mouth 3 (three) times daily as needed. -     FLUoxetine (PROZAC) 20 MG capsule; Take 1 capsule (20 mg total) by mouth daily. -     hydrOXYzine (ATARAX/VISTARIL) 25 MG tablet; Take 1 tablet (25 mg total) by mouth 3 (three) times daily as needed.  OSA (obstructive sleep apnea) -     Armodafinil 250 MG tablet; Take 1 tablet (250 mg total) by mouth daily.     Please see After Visit Summary for patient specific instructions.  Future Appointments  Date Time Provider Department Center  12/23/2020  8:00 AM Wanda Plump, MD [facility]  01/31/2021  7:30 AM Dohmeier, Porfirio Mylar, MD [facility] None    No orders of the defined types were placed in this encounter.     -------------------------------

## 2020-07-29 ENCOUNTER — Other Ambulatory Visit: Payer: Self-pay | Admitting: Cardiology

## 2020-07-29 ENCOUNTER — Other Ambulatory Visit: Payer: Self-pay

## 2020-07-29 MED ORDER — CARVEDILOL 6.25 MG PO TABS: 6.2500 mg | ORAL_TABLET | Freq: Two times a day (BID) | ORAL | 0 refills | Status: DC

## 2020-07-29 NOTE — Progress Notes (Signed)
Refill sent to pharmacy.   

## 2020-08-08 ENCOUNTER — Encounter: Payer: Self-pay | Admitting: Adult Health

## 2020-08-08 ENCOUNTER — Telehealth (INDEPENDENT_AMBULATORY_CARE_PROVIDER_SITE_OTHER): Payer: 59 | Admitting: Adult Health

## 2020-08-08 DIAGNOSIS — F331 Major depressive disorder, recurrent, moderate: Secondary | ICD-10-CM

## 2020-08-08 DIAGNOSIS — G4733 Obstructive sleep apnea (adult) (pediatric): Secondary | ICD-10-CM

## 2020-08-08 DIAGNOSIS — F3181 Bipolar II disorder: Secondary | ICD-10-CM

## 2020-08-08 DIAGNOSIS — G4700 Insomnia, unspecified: Secondary | ICD-10-CM | POA: Diagnosis not present

## 2020-08-08 DIAGNOSIS — F411 Generalized anxiety disorder: Secondary | ICD-10-CM

## 2020-08-08 MED ORDER — ALPRAZOLAM 1 MG PO TABS: 0.5000 mg | ORAL_TABLET | Freq: Three times a day (TID) | ORAL | 2 refills | Status: DC | PRN

## 2020-08-08 MED ORDER — FLUOXETINE HCL 40 MG PO CAPS: 40.0000 mg | ORAL_CAPSULE | Freq: Every day | ORAL | 5 refills | Status: DC

## 2020-08-08 MED ORDER — LURASIDONE HCL 60 MG PO TABS: 60.0000 mg | ORAL_TABLET | Freq: Every day | ORAL | 5 refills | Status: DC

## 2020-08-08 NOTE — Progress Notes (Signed)
Leslie Rice [id number] Oct 13, 1965 55 y.o.  Virtual Visit via Video Note  I connected with pt @ on 08/08/20 at 12:00 PM EDT by a video enabled telemedicine application and verified that I am speaking with the correct person using two identifiers.   I discussed the limitations of evaluation and management by telemedicine and the availability of in person appointments. The patient expressed understanding and agreed to proceed.  I discussed the assessment and treatment plan with the patient. The patient was provided an opportunity to ask questions and all were answered. The patient agreed with the plan and demonstrated an understanding of the instructions.   The patient was advised to call back or seek an in-person evaluation if the symptoms worsen or if the condition fails to improve as anticipated.  I provided 30 minutes of non-face-to-face time during this encounter.  The patient was located at home.  The provider was located at [facility].   Leslie Gibbs, NP   Subjective:   Patient ID:  Leslie Rice is a 55 y.o. (DOB [birth year]) female.  Chief Complaint: No chief complaint on file.   HPI Leslie Rice presents for follow-up of MDD, GAD, insomnia. BPD2.   Referred by PCP  Describes mood today as "not so good". Pleasant. Tearful. Mood symptoms - reports depression, anxiety and irritability. Stating "I'm having a hard time". She and boyfriend unexpectedly broke up. Stating "I can't quit crying". Wanting to increase medications temporarily while she gets her mood stabilized. Has been staying home and in the bed. Works remotely and is signing on, but not as productive. Her daughters have sent flowers to cheer her up.  Decreased interest and motivation. Taking medications as prescribed.  Energy levels decreased.  Active, does not have a regular exercise routine.  Enjoys some usual interests and activities. Single. Has 2 children 24 and 21. Oldest daughter in  [place]. Younger daughter at [facility]. Mostly staying home.  Appetite decreased. Weight loss - 10 pounds. Sleeping better some nights than others - "it's ok". Averages 4-6 hours. Focus and concentration improved with Provigil - taking daily. Completing tasks. Managing aspects of household. 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  Denies SI or HI. Denies AH or VH.  Previously seen by: Leslie Rice   Previous medications: Zoloft, Wellbutrin, Lexapro, Effexor    Review of Systems:  Review of Systems  Musculoskeletal: Negative for gait problem.  Neurological: Negative for tremors.  Psychiatric/Behavioral:       Please refer to HPI    Medications: I have reviewed the patient's current medications.  Current Outpatient Medications  Medication Sig Dispense Refill  . ALPRAZolam (XANAX) 1 MG tablet Take 0.5 tablets (0.5 mg total) by mouth 3 (three) times daily as needed. 60 tablet 2  . Armodafinil 250 MG tablet Take 1 tablet (250 mg total) by mouth daily. 30 tablet 2  . carvedilol (COREG) 6.25 MG tablet Take 1 tablet (6.25 mg total) by mouth 2 (two) times daily. Patient needs appointment for further refills. 2nd attempt 60 tablet 0  . Cholecalciferol (VITAMIN D) 50 MCG (2000 UT) CAPS Take 1 capsule by mouth.    . Cyanocobalamin (VITAMIN B 12 PO) Take 1 capsule by mouth.    Leslie Kitchen Rice (PROZAC) 40 MG capsule Take 1 capsule (40 mg total) by mouth daily. 30 capsule 5  . HYDROcodone-acetaminophen (NORCO) 10-325 MG per tablet Take 1 tablet by mouth every 6 (six) hours as needed for moderate pain.     Leslie Kitchen  hydrOXYzine (ATARAX/VISTARIL) 25 MG tablet Take 1 tablet (25 mg total) by mouth 3 (three) times daily as needed. 270 tablet 3  . Lurasidone HCl (LATUDA) 60 MG TABS Take 1 tablet (60 mg total) by mouth daily with breakfast. 30 tablet 5  . promethazine (PHENERGAN) 25 MG tablet Take 1 tablet (25 mg total) by mouth every 6 (six) hours as needed for nausea or vomiting. 30 tablet 1  . rivaroxaban  (XARELTO) 20 MG TABS tablet Take 1 tablet (20 mg total) by mouth daily with supper. 90 tablet 3  . SF 5000 PLUS 1.1 % CREA dental cream     . SUMAtriptan (IMITREX) 50 MG tablet One tablet by mouth at start of migraine.  May repeat in 2 hours if no improvement x 1 dose. 10 tablet 2   No current facility-administered medications for this visit.    Medication Side Effects: None  Allergies:  Allergies  Allergen Reactions  . Ciprofloxacin     REACTION: vomiting  . Codeine     REACTION: vomiting  . Sulfonamide Derivatives     hives    Past Medical History:  Diagnosis Date  . *Depression 07/21/2008   Used to see a Veterinary surgeoncounselor. Meds RF by previous PCP    . B12 deficiency 07/21/2008   Qualifier: Diagnosis of  By: Leslie DistanceKowalk CMA (AAMA), Leslie Rice    . Chronic pain syndrome    thoracic back pain, seeing Dr. Ethelene Halamos  . DEPRESSION   . DVT (deep venous thrombosis) (HCC)   . Edema 07/21/2008   Qualifier: Diagnosis of  By: Leslie DistanceKowalk CMA (AAMA), Leslie Rice    . FATIGUE 07/21/2008   Qualifier: Diagnosis of  By: Leslie DistanceKowalk CMA (AAMA), Leslie Rice    . GERD   . Hyperlipidemia 07/21/2008   Intolerant to simvastatin and Pravachol   . Hypersomnia with sleep apnea 10/20/2015  . Insomnia 07/21/2008   Qualifier: Diagnosis of  By: Leslie DistanceKowalk CMA (AAMA), Leslie Rice    . Irritable bowel syndrome   . Migraine headache 07/21/2008   Menstrual related, used to take HRT, now unable to due to clots Used to see Dr Sandria ManlyLove     . Morbid obesity due to excess calories (HCC) 10/20/2015  . OSA (obstructive sleep apnea)    not on Cpap @ present 11-2013  . Primary hypercoagulable state (HCC)   . PULMONARY EMBOLISM, HX OF 2002 07/21/2008   Qualifier: Diagnosis of  By: Leslie DistanceKowalk CMA (AAMA), Leslie Rice    . Seasonal allergies 07/26/2015  . Sleep related headaches 10/20/2015  . Vitamin D deficiency 07/21/2008   Qualifier: Diagnosis of  By: Leslie DistanceKowalk CMA Duncan Dull(AAMA), Leslie Rice      Family History  Problem Relation Age of Onset  . Colon cancer Other        M, aunt, nephew  .  CAD Father        age? "silent MIs"  . Lung cancer Father   . Diabetes Father        F, sisters x2  . Thyroid cancer Sister   . Colon cancer Sister        dx age 53  . Breast cancer Neg Hx   . Stroke Neg Hx     Social History   Socioeconomic History  . Marital status: Single    Spouse name: Not on file  . Number of children: 2  . Years of education: some college  . Highest education level: Not on file  Occupational History  . 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  Smoking status: Never Smoker  . Smokeless tobacco: Never Used  Vaping Use  . Vaping Use: Never used  Substance and Sexual Activity  . Alcohol use: Yes    Comment: occ  . Drug use: No  . Sexual activity: Not Currently  Other Topics Concern  . Not on file  Social History Narrative   Live by herself   One graduated from [employer], lives in [place]   Right-handed.   No daily use of caffeine.          Social Determinants of Health   Financial Resource Strain: Not on file  Food Insecurity: Not on file  Transportation Needs: Not on file  Physical Activity: Not on file  Stress: Not on file  Social Connections: Not on file  Intimate Partner Violence: Not on file    Past Medical History, Surgical history, Social history, and Family history were reviewed and updated as appropriate.   Please see review of systems for further details on the patient's review from today.   Objective:   Physical Exam:  There were no vitals taken for this visit.  Physical Exam Constitutional:      General: She is not in acute distress. Musculoskeletal:        General: No deformity.  Neurological:     Mental Status: She is alert and oriented to person, place, and time.     Coordination: Coordination normal.  Psychiatric:        Attention and Perception: Attention and perception normal. She does not perceive auditory or visual hallucinations.        Mood and Affect: Mood normal. Mood is not anxious or depressed.  Affect is not labile, blunt, angry or inappropriate.        Speech: Speech normal.        Behavior: Behavior normal.        Thought Content: Thought content normal. Thought content is not paranoid or delusional. Thought content does not include homicidal or suicidal ideation. Thought content does not include homicidal or suicidal plan.        Cognition and Memory: Cognition and memory normal.        Judgment: Judgment normal.     Comments: Insight intact     Lab Review:     Component Value Date/Time   NA 141 12/23/2019 0852   K 4.4 12/23/2019 0852   CL 107 12/23/2019 0852   CO2 24 12/23/2019 0852   GLUCOSE 94 12/23/2019 0852   BUN 12 12/23/2019 0852   CREATININE 0.85 12/23/2019 0852   CALCIUM 9.0 12/23/2019 0852   PROT 6.6 12/23/2019 0852   ALBUMIN 4.2 12/09/2018 0852   AST 14 12/23/2019 0852   ALT 12 12/23/2019 0852   ALKPHOS 68 12/09/2018 0852   BILITOT 0.6 12/23/2019 0852   GFRNONAA >60 03/18/2018 0347   GFRAA >60 03/18/2018 0347       Component Value Date/Time   WBC 4.6 12/23/2019 0852   RBC 4.48 12/23/2019 0852   HGB 13.8 12/23/2019 0852   HCT 40.5 12/23/2019 0852   PLT 229 12/23/2019 0852   MCV 90.4 12/23/2019 0852   MCH 30.8 12/23/2019 0852   MCHC 34.1 12/23/2019 0852   RDW 12.1 12/23/2019 0852   LYMPHSABS 1,357 12/23/2019 0852   MONOABS 0.2 03/11/2019 1107   EOSABS 83 12/23/2019 0852   BASOSABS 18 12/23/2019 0852    No results found for: POCLITH, LITHIUM   No results found for: PHENYTOIN, PHENOBARB, VALPROATE, CBMZ   .res Assessment:  Plan:    Plan:  1. Prozac 20mg  to 40 daily 2. Hydroxyzine 25mg  tablet - 1 to 3 at bedtime  3. Xanax 0.25mg  TID to 1mg  BID prn anxiety  4. Latuda 40mg  to 60mg  daily 5. Armodafonil 250mg  daily - sleep apnea  Offered therapy  Advised to call in 2 weeks for follow up  Follow up with Neurologist - Nuvigil or Provigil   RTC 2 to 4 weeks  Patient advised to contact office with any questions, adverse effects, or  acute worsening in signs and symptoms.  Discussed potential benefits, risk, and side effects of benzodiazepines to include potential risk of tolerance and dependence, as well as possible drowsiness.  Advised patient not to drive if experiencing drowsiness and to take lowest possible effective dose to minimize risk of dependence and tolerance.  Discussed potential metabolic side effects associated with atypical antipsychotics, as well as potential risk for movement side effects. Advised pt to contact office if movement side effects occur.     Diagnoses and all orders for this visit:  Bipolar II disorder (HCC) -     Lurasidone HCl (LATUDA) 60 MG TABS; Take 1 tablet (60 mg total) by mouth daily with breakfast. -     Rice (PROZAC) 40 MG capsule; Take 1 capsule (40 mg total) by mouth daily.  Insomnia, unspecified type  Major depressive disorder, recurrent episode, moderate (HCC) -     Rice (PROZAC) 40 MG capsule; Take 1 capsule (40 mg total) by mouth daily.  Generalized anxiety disorder -     ALPRAZolam (XANAX) 1 MG tablet; Take 0.5 tablets (0.5 mg total) by mouth 3 (three) times daily as needed. -     Rice (PROZAC) 40 MG capsule; Take 1 capsule (40 mg total) by mouth daily.  OSA (obstructive sleep apnea)     Please see After Visit Summary for patient specific instructions.  Future Appointments  Date Time Provider Department Center  12/23/2020  8:00 AM , MD [facility]  01/31/2021  7:30 AM Dohmeier, , MD [facility] None    No orders of the defined types were placed in this encounter.     -------------------------------

## 2020-08-11 ENCOUNTER — Telehealth: Payer: Self-pay | Admitting: Internal Medicine

## 2020-08-11 NOTE — Telephone Encounter (Signed)
Called, left message.  She sees behavioral care, recommend to discuss with them further steps. If severe symptoms including suicidality, needs to go to the ER

## 2020-08-11 NOTE — Telephone Encounter (Signed)
Daughter Joyice Faster is calling requesting to speak to Dr. Drue Novel to discuss her mothers Mental health.   She states her mother depressions Anxiety and [place] is getting worst. I explained to [place] that she may need to seen in office since her last visit was 2021.

## 2020-08-11 NOTE — Telephone Encounter (Signed)
Daughter, Joyice Faster is on [place].

## 2020-10-03 ENCOUNTER — Other Ambulatory Visit: Payer: Self-pay | Admitting: Adult Health

## 2020-10-03 DIAGNOSIS — G4733 Obstructive sleep apnea (adult) (pediatric): Secondary | ICD-10-CM

## 2020-10-04 NOTE — Telephone Encounter (Signed)
Had apt 5/02 but was in Er on 05/06 please review . No apt scheduled

## 2020-10-07 ENCOUNTER — Other Ambulatory Visit: Payer: Self-pay

## 2020-10-07 ENCOUNTER — Telehealth: Payer: Self-pay | Admitting: Adult Health

## 2020-10-07 DIAGNOSIS — G4733 Obstructive sleep apnea (adult) (pediatric): Secondary | ICD-10-CM

## 2020-10-07 NOTE — Telephone Encounter (Signed)
Pended.

## 2020-10-07 NOTE — Telephone Encounter (Signed)
Pt requesting refill for ARMODAFINIL 250 mg @ [facility] [address] [facility].

## 2020-10-13 ENCOUNTER — Encounter: Payer: Self-pay | Admitting: Adult Health

## 2020-10-13 ENCOUNTER — Telehealth (INDEPENDENT_AMBULATORY_CARE_PROVIDER_SITE_OTHER): Payer: 59 | Admitting: Adult Health

## 2020-10-13 DIAGNOSIS — F411 Generalized anxiety disorder: Secondary | ICD-10-CM | POA: Diagnosis not present

## 2020-10-13 DIAGNOSIS — G4700 Insomnia, unspecified: Secondary | ICD-10-CM | POA: Diagnosis not present

## 2020-10-13 DIAGNOSIS — F331 Major depressive disorder, recurrent, moderate: Secondary | ICD-10-CM | POA: Diagnosis not present

## 2020-10-13 DIAGNOSIS — G4733 Obstructive sleep apnea (adult) (pediatric): Secondary | ICD-10-CM

## 2020-10-13 DIAGNOSIS — F3181 Bipolar II disorder: Secondary | ICD-10-CM

## 2020-10-13 MED ORDER — LURASIDONE HCL 40 MG PO TABS: 40.0000 mg | ORAL_TABLET | Freq: Every day | ORAL | 3 refills | Status: DC

## 2020-10-13 MED ORDER — VENLAFAXINE HCL ER 75 MG PO CP24
75.0000 mg | ORAL_CAPSULE | Freq: Every day | ORAL | 3 refills | Status: DC
Start: 2020-10-13 — End: 2021-09-12

## 2020-10-13 NOTE — Progress Notes (Addendum)
Leslie Rice [id number] 1965/05/28 55 y.o.  Virtual Visit via Video Note  I connected with pt @ on 10/13/20 at 11:40 AM EDT by a video enabled telemedicine application and verified that I am speaking with the correct person using two identifiers.   I discussed the limitations of evaluation and management by telemedicine and the availability of in person appointments. The patient expressed understanding and agreed to proceed.  I discussed the assessment and treatment plan with the patient. The patient was provided an opportunity to ask questions and all were answered. The patient agreed with the plan and demonstrated an understanding of the instructions.   The patient was advised to call back or seek an in-person evaluation if the symptoms worsen or if the condition fails to improve as anticipated.  I provided 30 minutes of non-face-to-face time during this encounter.  The patient was located at home.  The provider was located at [facility].   Dorothyann Gibbsegina N Khylin Gutridge, NP   Subjective:   Patient ID:  Leslie HewsWendy L Fedak is a 55 y.o. (DOB 1965/05/28) female.  Chief Complaint: No chief complaint on file.   HPI Leslie HewsWendy L Bozich presents for follow-up of  MDD, GAD, insomnia. BPD2.   Describes mood today as "ok". Pleasant. Tearful. Mood symptoms - reports decreased depression, anxiety and irritability. Stating "I'm doing a little bit better". [facility] for mood stabilization. Feels like there were multiple situational stressors that led to the decompensation. Medications were changed during the visit and she feels like that are helpful. Once out of the hospital her older sister recently passed away on June 5th, 2022. Stating "that was really hard for me". Trying to work through all the changes and losses. Would like to get started with a therapist and continue medications. Medications were changed while hospitalized and she feels they are helping. Has returned  to work and is staying busy. Has regular communication with daughters.Improved interest and motivation. Taking medications as prescribed.  Energy levels decreased.  Active, does not have a regular exercise routine.  Enjoys some usual interests and activities. Single. Has 2 children 24 and 21. Oldest daughter in [place]. Younger daughter senior at [employer]. Mostly staying home.  Appetite improved. Weight stable. Sleeping well most nights. Averages 8 to 9 hours. Focus and concentration "ok". Has been off the Nuvigil for a bout a week and has noticed a big difference. Completing tasks. Managing aspects of household. Works full-time in [employer] - 7 days a week.  Denies SI or HI. Denies AH or VH.  Previously seen by: Valinda HoarMeredith Baker   Previous medications: Zoloft, Wellbutrin, Lexapro, Effexor  Review of Systems:  Review of Systems  Musculoskeletal:  Negative for gait problem.  Neurological:  Negative for tremors.  Psychiatric/Behavioral:         Please refer to HPI   Medications: I have reviewed the patient's current medications.  Current Outpatient Medications  Medication Sig Dispense Refill   lurasidone (LATUDA) 40 MG TABS tablet Take 1 tablet (40 mg total) by mouth daily with breakfast. 90 tablet 3   venlafaxine XR (EFFEXOR XR) 75 MG 24 hr capsule Take 1 capsule (75 mg total) by mouth daily with breakfast. 90 capsule 3   Armodafinil 250 MG tablet Take 1 tablet (250 mg total) by mouth daily. 30 tablet 2   carvedilol (COREG) 6.25 MG tablet Take 1 tablet (6.25 mg total) by mouth 2 (two) times daily. Patient needs appointment for further refills. 2nd attempt  60 tablet 0   Cholecalciferol (VITAMIN D) 50 MCG (2000 UT) CAPS Take 1 capsule by mouth.     Cyanocobalamin (VITAMIN B 12 PO) Take 1 capsule by mouth.     HYDROcodone-acetaminophen (NORCO) 10-325 MG per tablet Take 1 tablet by mouth every 6 (six) hours as needed for moderate pain.      hydrOXYzine (ATARAX/VISTARIL) 25 MG tablet Take 1  tablet (25 mg total) by mouth 3 (three) times daily as needed. 270 tablet 3   promethazine (PHENERGAN) 25 MG tablet Take 1 tablet (25 mg total) by mouth every 6 (six) hours as needed for nausea or vomiting. 30 tablet 1   rivaroxaban (XARELTO) 20 MG TABS tablet Take 1 tablet (20 mg total) by mouth daily with supper. 90 tablet 3   SF 5000 PLUS 1.1 % CREA dental cream      SUMAtriptan (IMITREX) 50 MG tablet One tablet by mouth at start of migraine.  May repeat in 2 hours if no improvement x 1 dose. 10 tablet 2   No current facility-administered medications for this visit.    Medication Side Effects: None  Allergies:  Allergies  Allergen Reactions   Ciprofloxacin     REACTION: vomiting   Codeine     REACTION: vomiting   Sulfonamide Derivatives     hives    Past Medical History:  Diagnosis Date   *Depression 07/21/2008   Used to see a Veterinary surgeon. Meds RF by previous PCP     B12 deficiency 07/21/2008   Qualifier: Diagnosis of  By: Koleen Distance CMA (AAMA), Leisha     Chronic pain syndrome    thoracic back pain, seeing Dr. Ethelene Hal   DEPRESSION    DVT (deep venous thrombosis) (HCC)    Edema 07/21/2008   Qualifier: Diagnosis of  By: Koleen Distance CMA (AAMA), Hulan Saas     FATIGUE 07/21/2008   Qualifier: Diagnosis of  By: Koleen Distance CMA (AAMA), Leisha     GERD    Hyperlipidemia 07/21/2008   Intolerant to simvastatin and Pravachol    Hypersomnia with sleep apnea 10/20/2015   Insomnia 07/21/2008   Qualifier: Diagnosis of  By: Koleen Distance CMA (AAMA), Hulan Saas     Irritable bowel syndrome    Migraine headache 07/21/2008   Menstrual related, used to take HRT, now unable to due to clots Used to see Dr Sandria Manly      Morbid obesity due to excess calories (HCC) 10/20/2015   OSA (obstructive sleep apnea)    not on Cpap @ present 11-2013   Primary hypercoagulable state (HCC)    PULMONARY EMBOLISM, HX OF 2002 07/21/2008   Qualifier: Diagnosis of  By: Koleen Distance CMA (AAMA), Leisha     Seasonal allergies 07/26/2015   Sleep related  headaches 10/20/2015   Vitamin D deficiency 07/21/2008   Qualifier: Diagnosis of  By: Koleen Distance CMA Duncan Dull), Hulan Saas      Family History  Problem Relation Age of Onset   Colon cancer Other        M, aunt, nephew   CAD Father        age? 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   No daily use of caffeine.          Social Determinants of Health   Financial Resource Strain: Not on file  Food Insecurity: Not on file  Transportation Needs: Not on file  Physical Activity: Not on file  Stress: Not on file  Social Connections: Not on file  Intimate Partner Violence: Not on file    Past Medical History, Surgical history, Social history, and Family history were reviewed and updated as appropriate.   Please see review of systems for further details on the patient's review from today.   Objective:   Physical Exam:  There were no vitals taken for this visit.  Physical Exam Constitutional:      General: She is not in acute distress. Musculoskeletal:        General: No deformity.  Neurological:     Mental Status: She is alert and oriented to person, place, and time.     Coordination: Coordination normal.  Psychiatric:         Attention and Perception: Attention and perception normal. She does not perceive auditory or visual hallucinations.        Mood and Affect: Mood normal. Mood is not anxious or depressed. Affect is not labile, blunt, angry or inappropriate.        Speech: Speech normal.        Behavior: Behavior normal.        Thought Content: Thought content normal. Thought content is not paranoid or delusional. Thought content does not include homicidal or suicidal ideation. Thought content does not include homicidal or suicidal plan.        Cognition and Memory: Cognition and memory normal.        Judgment: Judgment normal.     Comments: Insight intact    Lab Review:     Component Value Date/Time   NA 141 12/23/2019 0852   K 4.4 12/23/2019 0852   CL 107 12/23/2019 0852   CO2 24 12/23/2019 0852   GLUCOSE 94 12/23/2019 0852   BUN 12 12/23/2019 0852   CREATININE 0.85 12/23/2019 0852   CALCIUM 9.0 12/23/2019 0852   PROT 6.6 12/23/2019 0852   ALBUMIN 4.2 12/09/2018 0852   AST 14 12/23/2019 0852   ALT 12 12/23/2019 0852   ALKPHOS 68 12/09/2018 0852   BILITOT 0.6 12/23/2019 0852   GFRNONAA >60 03/18/2018 0347   GFRAA >60 03/18/2018 0347       Component Value Date/Time   WBC 4.6 12/23/2019 0852   RBC 4.48 12/23/2019 0852   HGB 13.8 12/23/2019 0852   HCT 40.5 12/23/2019 0852   PLT 229 12/23/2019 0852   MCV 90.4 12/23/2019 0852   MCH 30.8 12/23/2019 0852   MCHC 34.1 12/23/2019 0852   RDW 12.1 12/23/2019 0852   LYMPHSABS 1,357 12/23/2019 0852   MONOABS  0.2 03/11/2019 1107   EOSABS 83 12/23/2019 0852   BASOSABS 18 12/23/2019 0852    No results found for: POCLITH, LITHIUM   No results found for: PHENYTOIN, PHENOBARB, VALPROATE, CBMZ   .res Assessment: Plan:    Plan:  D/C Prozac 40mg  daily  Add Effexor XR 75mg  daily - started at hospital  Latuda 40mg  daily Armodafonil 250mg  daily - sleep apnea Hydroxyzine 25mg  tablet - 1 to 3 at bedtime   Neurologist will resume at next  appointment in November - Nuvigil or Provigil.  Collateral reviewed from recent hospital stay.   RTC 6 weeks  Patient advised to contact office with any questions, adverse effects, or acute worsening in signs and symptoms.  Discussed potential benefits, risk, and side effects of benzodiazepines to include potential risk of tolerance and dependence, as well as possible drowsiness.  Advised patient not to drive if experiencing drowsiness and to take lowest possible effective dose to minimize risk of dependence and tolerance.  Discussed potential metabolic side effects associated with atypical antipsychotics, as well as potential risk for movement side effects. Advised pt to contact office if movement side effects occur.    Diagnoses and all orders for this visit:  Bipolar II disorder (HCC) -     lurasidone (LATUDA) 40 MG TABS tablet; Take 1 tablet (40 mg total) by mouth daily with breakfast.  Insomnia, unspecified type  Major depressive disorder, recurrent episode, moderate (HCC) -     venlafaxine XR (EFFEXOR XR) 75 MG 24 hr capsule; Take 1 capsule (75 mg total) by mouth daily with breakfast.  Generalized anxiety disorder -     venlafaxine XR (EFFEXOR XR) 75 MG 24 hr capsule; Take 1 capsule (75 mg total) by mouth daily with breakfast.  OSA (obstructive sleep apnea)    Please see After Visit Summary for patient specific instructions.  Future Appointments  Date Time Provider Department Center  12/23/2020  8:00 AM , MD [facility]  01/31/2021  7:30 AM Dohmeier, December, MD [facility] None    No orders of the defined types were placed in this encounter.     -------------------------------

## 2020-10-14 ENCOUNTER — Other Ambulatory Visit: Payer: Self-pay | Admitting: Adult Health

## 2020-10-14 DIAGNOSIS — G4733 Obstructive sleep apnea (adult) (pediatric): Secondary | ICD-10-CM

## 2020-10-14 NOTE — Telephone Encounter (Signed)
Please approve

## 2020-10-18 ENCOUNTER — Telehealth: Payer: Self-pay

## 2020-10-18 NOTE — Telephone Encounter (Signed)
Prior Approval received for ARMODAFINIL 250 MG with [employer] ID# [id number], effective 10/18/2020-10/18/2021.

## 2020-11-01 ENCOUNTER — Other Ambulatory Visit: Payer: Self-pay | Admitting: Cardiology

## 2020-11-02 NOTE — Telephone Encounter (Signed)
Carvedilol  Mg # 30 tablets only, message to patient appointment required for future refills/ 2nd attempt sent to [id number] - [place], [place] - [address]

## 2020-11-25 ENCOUNTER — Telehealth: Payer: Self-pay | Admitting: Adult Health

## 2020-11-25 NOTE — Telephone Encounter (Signed)
Pt called and made an appt for 12/15/20. She needs refills on her xanax she wants the lower dose os 0.5 mg  and she also needs armodafinil 250 mg to be sent to the [facility] on [address] in [place]

## 2020-11-28 ENCOUNTER — Other Ambulatory Visit: Payer: Self-pay

## 2020-11-28 DIAGNOSIS — G4733 Obstructive sleep apnea (adult) (pediatric): Secondary | ICD-10-CM

## 2020-11-28 NOTE — Telephone Encounter (Signed)
Pended.

## 2020-11-29 ENCOUNTER — Other Ambulatory Visit: Payer: Self-pay | Admitting: Adult Health

## 2020-11-29 DIAGNOSIS — F411 Generalized anxiety disorder: Secondary | ICD-10-CM

## 2020-11-29 DIAGNOSIS — G4733 Obstructive sleep apnea (adult) (pediatric): Secondary | ICD-10-CM

## 2020-12-01 NOTE — Telephone Encounter (Signed)
Armodafinil last filled 7/13 appt on 9/6

## 2020-12-13 ENCOUNTER — Ambulatory Visit: Payer: 59 | Admitting: Adult Health

## 2020-12-13 ENCOUNTER — Telehealth (INDEPENDENT_AMBULATORY_CARE_PROVIDER_SITE_OTHER): Payer: 59 | Admitting: Adult Health

## 2020-12-13 ENCOUNTER — Encounter: Payer: Self-pay | Admitting: Adult Health

## 2020-12-13 DIAGNOSIS — G4700 Insomnia, unspecified: Secondary | ICD-10-CM | POA: Diagnosis not present

## 2020-12-13 DIAGNOSIS — F411 Generalized anxiety disorder: Secondary | ICD-10-CM

## 2020-12-13 DIAGNOSIS — F331 Major depressive disorder, recurrent, moderate: Secondary | ICD-10-CM

## 2020-12-13 DIAGNOSIS — F3181 Bipolar II disorder: Secondary | ICD-10-CM

## 2020-12-13 MED ORDER — BUSPIRONE HCL 10 MG PO TABS
ORAL_TABLET | ORAL | 2 refills | Status: DC
Start: 2020-12-13 — End: 2021-09-12

## 2020-12-13 NOTE — Progress Notes (Signed)
Leslie Rice [id number] 02-02-1966 55 y.o.  Virtual Visit via Video Note  I connected with pt @ on 12/13/20 at  5:00 PM EDT by a video enabled telemedicine application and verified that I am speaking with the correct person using two identifiers.   I discussed the limitations of evaluation and management by telemedicine and the availability of in person appointments. The patient expressed understanding and agreed to proceed.  I discussed the assessment and treatment plan with the patient. The patient was provided an opportunity to ask questions and all were answered. The patient agreed with the plan and demonstrated an understanding of the instructions.   The patient was advised to call back or seek an in-person evaluation if the symptoms worsen or if the condition fails to improve as anticipated.  I provided 25 minutes of non-face-to-face time during this encounter.  The patient was located at home.  The provider was located at [facility].   Dorothyann Gibbs, NP   Subjective:   Patient ID:  Leslie Rice is a 55 y.o. (DOB [birth year]) female.  Chief Complaint: No chief complaint on file.   HPI Leslie Rice presents for follow-up of MDD, GAD, insomnia. BPD2.   Describes mood today as "ok". Pleasant. Tearful at times. Mood symptoms - reports decreased depression. Feeling more anxious and irritable. Has been more snappy in the work setting. Stating "I don't like to feel this way". Feels like the Xanax was helpful previously, but was stopped while in the hospital. Is willing to try alternative medication for symptoms. 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 Talking with daughters. Improved interest and motivation. Taking medications as prescribed.  Energy levels improved. Active, does not have a regular exercise routine.  Enjoys some usual interests and activities. Single. Has 2 daughters 52 and 60. Oldest daughter in  [place]. Younger daughter senior at [employer]. Mostly staying home.  Appetite improved. Weight stable. Sleeps well most nights. Averages 8 to 9 hours. Focus and concentration improved. Restarted Armodafinil. Completing tasks. Managing aspects of household. 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  Denies SI or HI. Denies AH or VH.  Previously seen by: Valinda Hoar   Previous medications: Zoloft, Wellbutrin, Lexapro, Effexor    Review of Systems:  Review of Systems  Musculoskeletal:  Negative for gait problem.  Neurological:  Negative for tremors.  Psychiatric/Behavioral:         Please refer to HPI   Medications: I have reviewed the patient's current medications.  Current Outpatient Medications  Medication Sig Dispense Refill   Armodafinil 250 MG tablet Take 1 tablet by mouth once daily 30 tablet 0   carvedilol (COREG) 6.25 MG tablet Take 1 tablet (6.25 mg total) by mouth 2 (two) times daily with a meal. Appointment required for future refills / 2nd attempt 30 tablet 0   Cholecalciferol (VITAMIN D) 50 MCG (2000 UT) CAPS Take 1 capsule by mouth.     Cyanocobalamin (VITAMIN B 12 PO) Take 1 capsule by mouth.     HYDROcodone-acetaminophen (NORCO) 10-325 MG per tablet Take 1 tablet by mouth every 6 (six) hours as needed for moderate pain.      hydrOXYzine (ATARAX/VISTARIL) 25 MG tablet Take 1 tablet (25 mg total) by mouth 3 (three) times daily as needed. 270 tablet 3   lurasidone (LATUDA) 40 MG TABS tablet Take 1 tablet (40 mg total) by mouth daily with breakfast. 90 tablet 3  promethazine (PHENERGAN) 25 MG tablet Take 1 tablet (25 mg total) by mouth every 6 (six) hours as needed for nausea or vomiting. 30 tablet 1   rivaroxaban (XARELTO) 20 MG TABS tablet Take 1 tablet (20 mg total) by mouth daily with supper. 90 tablet 3   SF 5000 PLUS 1.1 % CREA dental cream      SUMAtriptan (IMITREX) 50 MG tablet One tablet by mouth at start of migraine.  May repeat in 2 hours if no improvement  x 1 dose. 10 tablet 2   venlafaxine XR (EFFEXOR XR) 75 MG 24 hr capsule Take 1 capsule (75 mg total) by mouth daily with breakfast. 90 capsule 3   No current facility-administered medications for this visit.    Medication Side Effects: None  Allergies:  Allergies  Allergen Reactions   Ciprofloxacin     REACTION: vomiting   Codeine     REACTION: vomiting   Sulfonamide Derivatives     hives    Past Medical History:  Diagnosis Date   *Depression 07/21/2008   Used to see a Veterinary surgeoncounselor. Meds RF by previous PCP     B12 deficiency 07/21/2008   Qualifier: Diagnosis of  By: Koleen DistanceKowalk CMA Duncan Dull(AAMA), Leisha     Chronic pain syndrome    thoracic back pain, seeing Dr. Ethelene Halamos   DEPRESSION    DVT (deep venous thrombosis) (HCC)    Edema 07/21/2008   Qualifier: Diagnosis of  By: Koleen DistanceKowalk CMA (AAMA), Hulan SaasLeisha     FATIGUE 07/21/2008   Qualifier: Diagnosis of  By: Koleen DistanceKowalk CMA (AAMA), Leisha     GERD    Hyperlipidemia 07/21/2008   Intolerant to simvastatin and Pravachol    Hypersomnia with sleep apnea 10/20/2015   Insomnia 07/21/2008   Qualifier: Diagnosis of  By: Koleen DistanceKowalk CMA (AAMA), Hulan SaasLeisha     Irritable bowel syndrome    Migraine headache 07/21/2008   Menstrual related, used to take HRT, now unable to due to clots Used to see Dr Sandria ManlyLove      Morbid obesity due to excess calories (HCC) 10/20/2015   OSA (obstructive sleep apnea)    not on Cpap @ present 11-2013   Primary hypercoagulable state (HCC)    PULMONARY EMBOLISM, HX OF 2002 07/21/2008   Qualifier: Diagnosis of  By: Koleen DistanceKowalk CMA (AAMA), Leisha     Seasonal allergies 07/26/2015   Sleep related headaches 10/20/2015   Vitamin D deficiency 07/21/2008   Qualifier: Diagnosis of  By: Koleen DistanceKowalk CMA Duncan Dull(AAMA), Hulan SaasLeisha      Family History  Problem Relation Age of Onset   Colon cancer Other        M, aunt, nephew   CAD Father        age? 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   No daily use of caffeine.          Social Determinants of Health   Financial Resource Strain: Not on file  Food Insecurity: Not on file  Transportation Needs: Not on file  Physical Activity: Not on file  Stress: Not on file  Social Connections: Not on file  Intimate Partner Violence: Not on file    Past Medical History, Surgical history, Social history, and Family history were reviewed and updated as appropriate.   Please see review of systems for further details on the patient's review from today.   Objective:   Physical Exam:  There were no vitals taken for this visit.  Physical Exam Constitutional:      General: She is not in acute distress. Musculoskeletal:        General: No deformity.  Neurological:     Mental Status: She is alert and oriented to person, place, and time.     Coordination: Coordination normal.  Psychiatric:        Attention and Perception: Attention and perception normal. She does not perceive auditory or visual hallucinations.        Mood and Affect: Mood normal. Mood is not anxious or depressed. Affect is not labile, blunt, angry or inappropriate.        Speech: Speech normal.        Behavior: Behavior normal.        Thought Content: Thought content normal. Thought content is not  paranoid or delusional. Thought content does not include homicidal or suicidal ideation. Thought content does not include homicidal or suicidal plan.        Cognition and Memory: Cognition and memory normal.        Judgment: Judgment normal.     Comments: Insight intact    Lab Review:     Component Value Date/Time   NA 141 12/23/2019 0852   K 4.4 12/23/2019 0852   CL 107 12/23/2019 0852   CO2 24 12/23/2019 0852   GLUCOSE 94 12/23/2019 0852   BUN 12 12/23/2019 0852   CREATININE 0.85 12/23/2019 0852   CALCIUM 9.0 12/23/2019 0852   PROT 6.6 12/23/2019 0852   ALBUMIN 4.2 12/09/2018 0852   AST 14 12/23/2019 0852   ALT 12 12/23/2019 0852   ALKPHOS 68 12/09/2018 0852   BILITOT 0.6 12/23/2019 0852   GFRNONAA >60 03/18/2018 0347   GFRAA >60 03/18/2018 0347       Component Value Date/Time   WBC 4.6 12/23/2019 0852   RBC 4.48 12/23/2019 0852   HGB 13.8 12/23/2019 0852   HCT 40.5 12/23/2019 0852   PLT 229 12/23/2019 0852   MCV 90.4 12/23/2019 0852   MCH 30.8 12/23/2019 0852   MCHC 34.1 12/23/2019 0852   RDW 12.1 12/23/2019 0852   LYMPHSABS 1,357 12/23/2019 0852   MONOABS 0.2 03/11/2019 1107   EOSABS 83 12/23/2019 0852   BASOSABS 18 12/23/2019 0852    No results found for: POCLITH, LITHIUM   No results found for: PHENYTOIN, PHENOBARB, VALPROATE, CBMZ   .res Assessment: Plan:    Plan:  Add Buspar 10mg  TID - 1/2 TID x 7 days, then one tablet 3 times for  anxiety and irritability  Effexor XR 75mg  daily - started at hospital Latuda 40mg  daily Hydroxyzine 25mg  tablet - 1 to 3 at bedtime   Armodafonil 250mg  daily - sleep apnea. Neurologist will resume at next appointment in November - Nuvigil or Provigil.  RTC 4 weeks  Patient advised to contact office with any questions, adverse effects, or acute worsening in signs and symptoms.  Discussed potential benefits, risk, and side effects of benzodiazepines to include potential risk of tolerance and dependence, as well as  possible drowsiness.  Advised patient not to drive if experiencing drowsiness and to take lowest possible effective dose to minimize risk of dependence and tolerance.  Discussed potential metabolic side effects associated with atypical antipsychotics, as well as potential risk for movement side effects. Advised pt to contact office if movement side effects occur.    There are no diagnoses linked to this encounter.   Please see After Visit Summary for patient specific instructions.  Future Appointments  Date Time Provider Department Center  12/23/2020  8:00 AM , MD [facility]  01/31/2021  7:30 AM Dohmeier, 12/25/2020, MD [facility] None    No orders of the defined types were placed in this encounter.     -------------------------------

## 2020-12-13 NOTE — Progress Notes (Signed)
Patient no show appointment. ? ?

## 2020-12-14 ENCOUNTER — Encounter: Payer: Self-pay | Admitting: Adult Health

## 2020-12-16 ENCOUNTER — Ambulatory Visit: Payer: 59 | Admitting: Adult Health

## 2020-12-23 ENCOUNTER — Telehealth (HOSPITAL_BASED_OUTPATIENT_CLINIC_OR_DEPARTMENT_OTHER): Payer: Self-pay

## 2020-12-23 ENCOUNTER — Encounter: Payer: Self-pay | Admitting: Internal Medicine

## 2020-12-23 ENCOUNTER — Ambulatory Visit (INDEPENDENT_AMBULATORY_CARE_PROVIDER_SITE_OTHER): Payer: 59 | Admitting: Internal Medicine

## 2020-12-23 ENCOUNTER — Other Ambulatory Visit: Payer: Self-pay

## 2020-12-23 VITALS — BP 122/84 | HR 82 | Temp 98.2°F | Resp 16 | Ht 63.0 in | Wt 199.2 lb

## 2020-12-23 DIAGNOSIS — F32A Depression, unspecified: Secondary | ICD-10-CM

## 2020-12-23 DIAGNOSIS — I10 Essential (primary) hypertension: Secondary | ICD-10-CM

## 2020-12-23 DIAGNOSIS — E785 Hyperlipidemia, unspecified: Secondary | ICD-10-CM

## 2020-12-23 DIAGNOSIS — Z1231 Encounter for screening mammogram for malignant neoplasm of breast: Secondary | ICD-10-CM

## 2020-12-23 DIAGNOSIS — Z0000 Encounter for general adult medical examination without abnormal findings: Secondary | ICD-10-CM

## 2020-12-23 DIAGNOSIS — Z23 Encounter for immunization: Secondary | ICD-10-CM | POA: Diagnosis not present

## 2020-12-23 DIAGNOSIS — E559 Vitamin D deficiency, unspecified: Secondary | ICD-10-CM | POA: Diagnosis not present

## 2020-12-23 DIAGNOSIS — R7989 Other specified abnormal findings of blood chemistry: Secondary | ICD-10-CM

## 2020-12-23 DIAGNOSIS — F419 Anxiety disorder, unspecified: Secondary | ICD-10-CM

## 2020-12-23 DIAGNOSIS — E538 Deficiency of other specified B group vitamins: Secondary | ICD-10-CM | POA: Diagnosis not present

## 2020-12-23 DIAGNOSIS — G43009 Migraine without aura, not intractable, without status migrainosus: Secondary | ICD-10-CM

## 2020-12-23 DIAGNOSIS — Z1211 Encounter for screening for malignant neoplasm of colon: Secondary | ICD-10-CM

## 2020-12-23 LAB — CBC WITH DIFFERENTIAL/PLATELET
Basophils Absolute: 0 10*3/uL (ref 0.0–0.1)
Basophils Relative: 0.7 % (ref 0.0–3.0)
Eosinophils Absolute: 0.1 10*3/uL (ref 0.0–0.7)
Eosinophils Relative: 3.5 % (ref 0.0–5.0)
HCT: 41.6 % (ref 36.0–46.0)
Hemoglobin: 14 g/dL (ref 12.0–15.0)
Lymphocytes Relative: 43.4 % (ref 12.0–46.0)
Lymphs Abs: 1.9 10*3/uL (ref 0.7–4.0)
MCHC: 33.6 g/dL (ref 30.0–36.0)
MCV: 91.4 fl (ref 78.0–100.0)
Monocytes Absolute: 0.3 10*3/uL (ref 0.1–1.0)
Monocytes Relative: 5.9 % (ref 3.0–12.0)
Neutro Abs: 2 10*3/uL (ref 1.4–7.7)
Neutrophils Relative %: 46.5 % (ref 43.0–77.0)
Platelets: 195 10*3/uL (ref 150.0–400.0)
RBC: 4.56 Mil/uL (ref 3.87–5.11)
RDW: 11.9 % (ref 11.5–15.5)
WBC: 4.3 10*3/uL (ref 4.0–10.5)

## 2020-12-23 LAB — COMPREHENSIVE METABOLIC PANEL
ALT: 64 U/L — ABNORMAL HIGH (ref 0–35)
AST: 83 U/L — ABNORMAL HIGH (ref 0–37)
Albumin: 4.4 g/dL (ref 3.5–5.2)
Alkaline Phosphatase: 91 U/L (ref 39–117)
BUN: 12 mg/dL (ref 6–23)
CO2: 28 mEq/L (ref 19–32)
Calcium: 9.4 mg/dL (ref 8.4–10.5)
Chloride: 106 mEq/L (ref 96–112)
Creatinine, Ser: 0.93 mg/dL (ref 0.40–1.20)
GFR: 70.46 mL/min (ref >60.00)
Glucose, Bld: 88 mg/dL (ref 70–99)
Potassium: 4.9 mEq/L (ref 3.5–5.1)
Sodium: 142 mEq/L (ref 135–145)
Total Bilirubin: 0.4 mg/dL (ref 0.2–1.2)
Total Protein: 6.9 g/dL (ref 6.0–8.3)

## 2020-12-23 LAB — TSH: TSH: 2.03 u[IU]/mL (ref 0.35–5.50)

## 2020-12-23 LAB — B12 AND FOLATE PANEL
Folate: 10.1 ng/mL (ref >5.9)
Vitamin B-12: 491 pg/mL (ref 211–911)

## 2020-12-23 LAB — LIPID PANEL
Cholesterol: 274 mg/dL — ABNORMAL HIGH (ref 0–200)
HDL: 54.4 mg/dL (ref >39.00)
LDL Cholesterol: 195 mg/dL — ABNORMAL HIGH (ref 0–99)
NonHDL: 220.07
Total CHOL/HDL Ratio: 5
Triglycerides: 125 mg/dL (ref 0.0–149.0)
VLDL: 25 mg/dL (ref 0.0–40.0)

## 2020-12-23 LAB — VITAMIN D 25 HYDROXY (VIT D DEFICIENCY, FRACTURES): VITD: 28.39 ng/mL — ABNORMAL LOW (ref 30.00–100.00)

## 2020-12-23 MED ORDER — PROMETHAZINE HCL 25 MG PO TABS: 25.0000 mg | ORAL_TABLET | Freq: Four times a day (QID) | ORAL | 1 refills | Status: DC | PRN

## 2020-12-23 MED ORDER — SUMATRIPTAN SUCCINATE 50 MG PO TABS: ORAL_TABLET | ORAL | 2 refills | Status: DC

## 2020-12-23 NOTE — Progress Notes (Signed)
Subjective:    Patient ID: Leslie Rice, female    DOB: December 05, 1965, 55 y.o.   MRN: [id number]  DOS:  12/23/2020 Type of visit - description: CPX  Since the last office visit she was admitted to hospital due to issues with Xanax. Currently off Xanax feeling well emotionally. + Weight loss noted, due to portion control per patient.    Wt Readings from Last 3 Encounters:  12/23/20 199 lb 4 oz (90.4 kg)  02/02/20 223 lb (101.2 kg)  12/23/19 223 lb (101.2 kg)     Review of Systems  Other than above, a 14 point review of systems is negative       Past Medical History:  Diagnosis Date   *Depression 07/21/2008   Used to see a Veterinary surgeon. Meds RF by previous PCP     B12 deficiency 07/21/2008   Qualifier: Diagnosis of  By: Koleen Distance CMA Duncan Dull), Leisha     Chronic pain syndrome    thoracic back pain, seeing Dr. Ethelene Hal   DEPRESSION    DVT (deep venous thrombosis) (HCC)    Edema 07/21/2008   Qualifier: Diagnosis of  By: Koleen Distance CMA (AAMA), Hulan Saas     FATIGUE 07/21/2008   Qualifier: Diagnosis of  By: Koleen Distance CMA (AAMA), Leisha     GERD    Hyperlipidemia 07/21/2008   Intolerant to simvastatin and Pravachol    Hypersomnia with sleep apnea 10/20/2015   Insomnia 07/21/2008   Qualifier: Diagnosis of  By: Koleen Distance CMA (AAMA), Hulan Saas     Irritable bowel syndrome    Migraine headache 07/21/2008   Menstrual related, used to take HRT, now unable to due to clots Used to see Dr Sandria Manly      Morbid obesity due to excess calories (HCC) 10/20/2015   OSA (obstructive sleep apnea)    not on Cpap @ present 11-2013   Primary hypercoagulable state (HCC)    PULMONARY EMBOLISM, HX OF 2002 07/21/2008   Qualifier: Diagnosis of  By: Koleen Distance CMA (AAMA), Leisha     Seasonal allergies 07/26/2015   Sleep related headaches 10/20/2015   Vitamin D deficiency 07/21/2008   Qualifier: Diagnosis of  By: Koleen Distance CMA (AAMA), Hulan Saas      Past Surgical History:  Procedure Laterality Date   c section     x 2    CARPAL TUNNEL  RELEASE     B   HYSTEROSCOPY W/ ENDOMETRIAL ABLATION  01/16/2019   NASAL SINUS SURGERY     Social History   Socioeconomic History   Marital status: Single    Spouse name: Not on file   Number of children: 2   Years of education: some college   Highest education level: Not on file  Occupational History   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   No daily use of caffeine.          Social Determinants of Health   Financial Resource Strain: Not on file  Food Insecurity: Not on file  Transportation Needs: Not on file  Physical Activity: Not on file  Stress: Not on file  Social Connections: Not on file  Intimate Partner Violence: Not on file     Allergies as of 12/23/2020       Reactions   Ciprofloxacin    REACTION: vomiting   Codeine    REACTION: vomiting   Sulfonamide Derivatives    hives        Medication List        Accurate as of December 23, 2020 11:59 PM. If you have any questions, ask your nurse or doctor.          STOP taking these medications    carvedilol 6.25 MG tablet Commonly known as: COREG Stopped by: Willow Ora, MD       TAKE these medications    Armodafinil 250 MG tablet Take 1 tablet by mouth once daily   busPIRone 10 MG tablet Commonly known as: BUSPAR Take 1/2 tablet three times daily for 7 days, then increase to one tablet three times daily.   HYDROcodone-acetaminophen 10-325 MG tablet Commonly known as: NORCO Take 1 tablet by mouth every 6 (six) hours as needed for moderate pain.   hydrOXYzine 25 MG tablet Commonly known as: ATARAX/VISTARIL Take 1 tablet (25 mg  total) by mouth 3 (three) times daily as needed.   lurasidone 40 MG Tabs tablet Commonly known as: Latuda Take 1 tablet (40 mg total) by mouth daily with breakfast.   promethazine 25 MG tablet Commonly known as: PHENERGAN Take 1 tablet (25 mg total) by mouth every 6 (six) hours as needed for nausea or vomiting.   rivaroxaban 20 MG Tabs tablet Commonly known as: Xarelto Take 1 tablet (20 mg total) by mouth daily with supper.   SF 5000 Plus 1.1 % Crea dental cream Generic drug: sodium fluoride   SUMAtriptan 50 MG tablet Commonly known as: IMITREX One tablet by mouth at start of migraine.  May repeat in 2 hours if no improvement x 1 dose.   venlafaxine XR 75 MG 24 hr capsule Commonly known as: Effexor XR Take 1 capsule (75 mg total) by mouth daily with breakfast.   VITAMIN B 12 PO Take 1 capsule by mouth.   Vitamin D 50 MCG (2000 UT) Caps Take 1 capsule by mouth.           Objective:   Physical Exam BP 122/84 (BP Location: Left Arm, Patient Position: Sitting, Cuff Size: Normal)   Pulse 82   Temp 98.2 F (36.8 C) (Oral)   Resp 16   Ht 5\' 3"  (1.6 m)   Wt 199 lb 4 oz (90.4 kg)   SpO2 98%   BMI 35.30 kg/m  General: Well developed, NAD, BMI noted Neck: No  thyromegaly  HEENT:  Normocephalic . Face symmetric, atraumatic Lungs:  CTA B Normal respiratory effort, no intercostal retractions, no accessory muscle use. Heart: RRR,  no murmur.  Abdomen:  Not distended, soft, non-tender. No rebound or rigidity.   Lower extremities: no pretibial edema bilaterally  Skin: Exposed areas without rash. Not pale. Not jaundice Neurologic:  alert & oriented X3.  Speech normal, gait appropriate for age and unassisted Strength symmetric and appropriate for age.  Psych: Cognition and judgment appear intact.  Cooperative with normal attention span and concentration.  Behavior appropriate. No anxious or depressed appearing.     Assessment     Assessment HTN, mild. Primary  hypercoagulable state, change Coumadin to Xarelto 05-2014 -PE 2002 while on HRT, saw Dr 2003 ;+ FH, testing on her was (-);  rx coumadin since 2002 for life  -superficial phlebitis 11-2014 - PE DVT 03/2018 (d/t non compliant w/ xarelto) Hyperlipidemia-- Pravachol, simvastatin caused aches  Morbidly obese Psych: ---Depression, anxiety,  insomnia: since divorce 2007  ---Polysubstance abuse, admitted 08/12/2020 Migraine headaches: sees neuro as of 12/2018 .on imitrex per pcp.  Used to take BB, Topamax IBS  OSA : + test before , repeat sleep study + 2017   Vit B12 and Vit D def-- poor compliance w/ supplements Chronic pain syndrome ---hydrocodone Rx per Dr. Ethelene Hal Chest pain: Saw cards 12/2018, echo: LVH, nuclear stress test negative. Endometrial ablation 01/2019  PLAN Here for CPX HTN: Ran out of carvedilol, BP is great, similar readings at home, improvement likely due to weight loss.  Restart carvedilol if needed. Hypercoagulability: Continue Xarelto Hyperlipidemia: On no meds, labs. Depression, anxiety, insomnia:sees psych, s/p admission to the  hospital due to issues with Xanax, it was a stopped, has BuSpar but does not use it.  Symptoms currently controlled OSA: Saw neurology 01-2020, they rx HST done 03/2020: Insignificant OSA.  On Armodafinil. Vitamin deficiencies B12 and D: Not on  oral supplements consistently, checking labs. RTC 6 m   This visit occurred during the SARS-CoV-2 public health emergency.  Safety protocols were in place, including screening questions prior to the visit, additional usage of staff PPE, and extensive cleaning of exam room while observing appropriate contact time as indicated for disinfecting solutions.

## 2020-12-23 NOTE — Patient Instructions (Addendum)
Recommend to proceed with the following vaccines at your pharmacy: Covid #3 Flu shot this fall  You are due for your mammogram after 9/22- we have placed an order for you. Please stop downstairs ([address]) to schedule that at your convenience.   Today you got the Shingrix vaccine, next dose in 3 months, please make appointment for a nurse visit  Check the  blood pressure twice a month  BP GOAL is between 110/65 and  135/85. If it is consistently higher or lower, let me know  We are referring you to gastroenterology, you are due for a colonoscopy, if you will hear from them in the next 2 weeks let us know  GO TO THE LAB : Get the blood work     GO TO THE FRONT DESK, PLEASE SCHEDULE YOUR APPOINTMENTS Come back for a checkup in 6 months  STOP BY THE FIRST FLOOR: Arrange for mammogram       "Living will", "Health Care Power of attorney": Advanced care planning  (If you already have a living will or healthcare power of attorney, please bring the copy to be scanned in your chart.)  Advance care planning is a process that supports adults in  understanding and sharing their preferences regarding future medical care.   The patient's preferences are recorded in documents called Advance Directives.    Advanced directives are completed (and can be modified at any time) while the patient is in full mental capacity.   The documentation should be available at all times to the patient, the family and the healthcare providers.  Bring in a copy to be scanned in your chart is an excellent idea and is recommended   This legal documents direct treatment decision making and/or appoint a surrogate to make the decision if the patient is not capable to do so.    Advance directives can be documented in many types of formats,  documents have names such as:  Lliving will  Durable power of attorney for healthcare (healthcare proxy or healthcare power of attorney)  Combined directives  Physician orders  for life-sustaining treatment    More information at:  [email or web address]

## 2020-12-24 ENCOUNTER — Encounter: Payer: Self-pay | Admitting: Internal Medicine

## 2020-12-24 NOTE — Assessment & Plan Note (Signed)
-  Td: 2015 - Shingrix discussed.  First dose now, next in few months. -Had C-19 shots, recommend booster -Flu shot today -[facility]  +FH Sister age 53   dx w/ colon cancer  . Mother and aunt had colon ca age dx in their 3s Pt had a cscope at age 22, normal @ [facility] S/p  cscope #2 on  03-2015, normal per report, was rec f/u 5 years.Refer to GI   -female care: Per gynecology,[facility] 12-2019 ([facility]) -Labs:  CMP, FLP, CBC, TSH, vitamin D, B12 -Diet and exercise discussed -POA discussed.

## 2020-12-24 NOTE — Assessment & Plan Note (Signed)
Here for CPX HTN: Ran out of carvedilol, BP is great, similar readings at home, improvement likely due to weight loss.  Restart carvedilol if needed. Hypercoagulability: Continue Xarelto Hyperlipidemia: On no meds, labs. Depression, anxiety, insomnia:sees psych, s/p admission to the  hospital due to issues with Xanax, it was a stopped, has BuSpar but does not use it.  Symptoms currently controlled OSA: Saw neurology 01-2020, they rx HST done 03/2020: Insignificant OSA.  On Armodafinil. Vitamin deficiencies B12 and D: Not on  oral supplements consistently, checking labs. RTC 6 m

## 2020-12-26 MED ORDER — VITAMIN D (ERGOCALCIFEROL) 1.25 MG (50000 UNIT) PO CAPS: 50000.0000 [IU] | ORAL_CAPSULE | ORAL | 0 refills | Status: DC

## 2020-12-26 NOTE — Addendum Note (Signed)
Addended byConrad Goodlow D on: 12/26/2020 08:00 AM   Modules accepted: Orders

## 2021-01-06 ENCOUNTER — Other Ambulatory Visit: Payer: Self-pay | Admitting: Adult Health

## 2021-01-06 ENCOUNTER — Encounter: Payer: Self-pay | Admitting: Adult Health

## 2021-01-06 DIAGNOSIS — G4733 Obstructive sleep apnea (adult) (pediatric): Secondary | ICD-10-CM

## 2021-01-06 NOTE — Telephone Encounter (Signed)
Pt called requesting Rx for Armodfafinil 250 mg @ [facility] [address] [facility]. New insurance. Updated chart.

## 2021-01-06 NOTE — Telephone Encounter (Signed)
Last seen 9/6 last filled 8/25

## 2021-01-30 ENCOUNTER — Encounter (HOSPITAL_BASED_OUTPATIENT_CLINIC_OR_DEPARTMENT_OTHER): Payer: Self-pay

## 2021-01-30 ENCOUNTER — Other Ambulatory Visit: Payer: Self-pay

## 2021-01-30 ENCOUNTER — Ambulatory Visit (HOSPITAL_BASED_OUTPATIENT_CLINIC_OR_DEPARTMENT_OTHER)
Admission: RE | Admit: 2021-01-30 | Discharge: 2021-01-30 | Disposition: A | Discharge status: Home / Self Care | Payer: 59 | Source: Ambulatory Visit | Attending: Internal Medicine | Admitting: Internal Medicine

## 2021-01-30 DIAGNOSIS — Z1231 Encounter for screening mammogram for malignant neoplasm of breast: Secondary | ICD-10-CM | POA: Diagnosis present

## 2021-01-31 ENCOUNTER — Telehealth: Payer: Self-pay | Admitting: *Deleted

## 2021-01-31 ENCOUNTER — Ambulatory Visit: Payer: 59 | Admitting: Neurology

## 2021-01-31 ENCOUNTER — Encounter: Payer: Self-pay | Admitting: Neurology

## 2021-01-31 VITALS — BP 137/93 | HR 75 | Ht 63.0 in | Wt 205.5 lb

## 2021-01-31 DIAGNOSIS — G4719 Other hypersomnia: Secondary | ICD-10-CM

## 2021-01-31 DIAGNOSIS — G4726 Circadian rhythm sleep disorder, shift work type: Secondary | ICD-10-CM | POA: Diagnosis not present

## 2021-01-31 DIAGNOSIS — G4733 Obstructive sleep apnea (adult) (pediatric): Secondary | ICD-10-CM | POA: Diagnosis not present

## 2021-01-31 MED ORDER — ARMODAFINIL 250 MG PO TABS
250.0000 mg | ORAL_TABLET | Freq: Every day | ORAL | 5 refills | Status: DC
Start: 2021-01-31 — End: 2021-09-07

## 2021-01-31 NOTE — Patient Instructions (Signed)
Armodafinil tablets What is this medication? ARMODAFINIL (ar moe DAF i nil) is used to treat excessive sleepiness caused by certain sleep disorders. This includes narcolepsy, sleep apnea, and shift work sleep disorder. This medicine may be used for other purposes; ask your health care provider or pharmacist if you have questions. COMMON BRAND NAME(S): Nuvigil What should I tell my care team before I take this medication? They need to know if you have any of these conditions: bipolar disorder depression drug or alcohol abuse or addiction heart disease high blood pressure kidney disease liver disease schizophrenia suicidal thoughts, plans, or attempt; a previous suicide attempt by you or a family member an unusual or allergic reaction to armodafinil, modafinil, medicines, foods, dyes, or preservatives pregnant or trying to get pregnant breast-feeding How should I use this medication? Take this medicine by mouth with a glass of water. Follow the directions on the prescription label. Take your doses at regular intervals. Do not take your medicine more often than directed. Do not stop taking this medicine suddenly except upon the advice of your doctor. Stopping this medicine too quickly may cause serious side effects or your condition may worsen. A special MedGuide will be given to you by the pharmacist with each prescription and refill. Be sure to read this information carefully each time. Talk to your pediatrician regarding the use of this medicine in children. While this drug may be prescribed for children as young as 77 years of age for selected conditions, precautions do apply. Overdosage: If you think you have taken too much of this medicine contact a poison control center or emergency room at once. NOTE: This medicine is only for you. Do not share this medicine with others. What if I miss a dose? If you miss a dose, take it as soon as you can. If it is almost time for your next dose, take  only that dose. Do not take double or extra doses. What may interact with this medication? Do not take this medicine with any of the following medications: amphetamine or dextroamphetamine dexmethylphenidate or methylphenidate MAOIs like Carbex, Eldepryl, Marplan, Nardil, and Parnate pemoline procarbazine This medicine may also interact with the following medications: antifungal medicines like itraconazole or ketoconazole barbiturates, like phenobarbital birth control pills or other hormone-containing birth control devices or implants carbamazepine cyclosporine diazepam medicines for depression, anxiety, or psychotic disturbances phenytoin propranolol triazolam warfarin This list may not describe all possible interactions. Give your health care provider a list of all the medicines, herbs, non-prescription drugs, or dietary supplements you use. Also tell them if you smoke, drink alcohol, or use illegal drugs. Some items may interact with your medicine. What should I watch for while using this medication? Visit your doctor or healthcare provider for regular checks on your progress. The full effect of this medicine may not be seen right away. This medicine may affect your concentration, function, or may hide signs that you are tired. You may get dizzy. This medicine will not eliminate your abnormal tendency to fall asleep and is not a replacement for sleep. Do not change your previous behavior regarding potentially dangerous activities, such as driving, using machinery, or doing anything that needs mental alertness until you know how this drug affects you. Alcohol can make you more dizzy and may interfere with your response to this medicine or your alertness. Avoid alcoholic drinks. This medicine may cause serious skin reactions. They can happen weeks to months after starting the medicine. Contact your healthcare provider right away if  you notice fevers or flu-like symptoms with a rash. The rash  may be red or purple and then turn into blisters or peeling of the skin. Or, you might notice a red rash with swelling of the face, lips, or lymph nodes in your neck or under your arms. Birth control pills may not work properly while you are taking this medicine. While using birth control pills, you will need an additional barrier method or an alternative non-hormonal method of birth control during treatment with armodafinil and for 1 month after stopping armodafinil. Talk to your doctor about which extra method of birth control is right for you. It is unknown if the effects of this medicine will be increased by the use of caffeine. Caffeine is found in many foods, beverages, and medications. Ask your doctor if you should limit or change your intake of caffeine-containing products while on this medicine. Do not stop previously prescribed treatments for your condition, such as a CPAP machine, except on the advise of your physician or healthcare provider. What side effects may I notice from receiving this medication? Side effects that you should report to your doctor or health care professional as soon as possible: allergic reactions like skin rash, itching or hives, swelling of the face, lips, or tongue anxiety breathing or swallowing problems chest pain depressed mood elevated mood, decreased need for sleep, racing thoughts, impulsive behavior fast, irregular heartbeat hallucination, loss of contact with reality increased blood pressure mouth sores, blisters, or peeling skin rash, fever, and swollen lymph nodes redness, blistering, peeling, or loosening of the skin, including inside the mouth sore throat, fever, or chills suicidal thoughts or other mood changes tremors vomiting Side effects that usually do not require medical attention (report to your doctor or health care professional if they continue or are bothersome): headache nausea, diarrhea, or stomach upset nervousness trouble  sleeping This list may not describe all possible side effects. Call your doctor for medical advice about side effects. You may report side effects to FDA at 1-800-FDA-1088. Where should I keep my medication? Keep out of the reach of children. Store at room temperature between 20 and 25 degrees C (68 and 77 degrees F). Throw away any unused medicine after the expiration date. NOTE: This sheet is a summary. It may not cover all possible information. If you have questions about this medicine, talk to your doctor, pharmacist, or health care provider.  2022 Elsevier/Gold Standard (2018-06-17 10:01:22)

## 2021-01-31 NOTE — Telephone Encounter (Signed)
Submitted PA armodafinil on CMM. Key: [id number]. Received instant approval: "CaseId:[id number];Status:Approved;Review Type:Prior Auth;Coverage Start Date:01/01/2021;Coverage End Date:01/31/2022;"

## 2021-01-31 NOTE — Progress Notes (Signed)
SLEEP MEDICINE CLINIC   Provider:  Melvyn Novas, M D  Referring Provider: Wanda Plump, MD Primary Care Physician:  Wanda Plump, MD  Chief Complaint  Patient presents with   Follow-up    Rm 10, alone. Here for yearly migraine f/u. Pt needing refills on pn medication. Pt reports migraines are under control. Needing refill on armodafinil [facility] advised she would need to get future refills from neurologist)    HPI:  Leslie Rice is a 55 y.o. female patient who was here scheduled for 7:30 AM visit.  I am very sorry.  And on 1 8.  She is here for a yearly migraine follow-up and is also needing refills on armodafinil.  She no longer uses Ajovy but feels that her migraines have been reduced to at mild most 2 events per month which she cannot control the sumatriptan alone.  The key is to get the migraine in the early onset stage.  BMI is at this time 36.4, blood pressure is only mildly elevated and we had performed in 2021 home sleep test to confirm the presence of obstructive sleep apnea which returned with such a mild AHI that I was concerned no CPAP would be necessary.  Leslie Rice however has reported that she feels her mother is presenting with very apneic breathing that she has witnessed.  There was a question of the home sleep test may have not picked up a typical night or may have not picked up apnea that is truly there. I will repeat a home sleep test, but her insurance has changed from [place] to [place] healthcare.  I would like to add that the patient had never felt great benefit from using CPAP for the treatment of previously mild apnea obstructive and that now was over a year of not using CPAP she has still benefit in terms of her migraine not having returned.  Sleep is not always restorative but this is also related to stress at work.       seen here as a referral from Dr. Drue Novel for a new sleep Consultation- Former patient of Dr Sandria Manly for [employer] migraines. Last  seen 01-2017- and at the time diagnosed with OSA and started on CPAP- but she soon felt it had no benefits, no improvement. She feels now fatigued again, doesn't rest well and is not restored in the morning, but a habitual mouth breather. She has been told by her bed partner her snoring is thunderous! And he observed apneas. She has no longer migraines - on Ajovy- a positive development.  She is still at the same , stressful ,job- working as IT trainer.       Last note from 01-25-2016. Leslie Rice reports that she was diagnosed with sleep apnea approximately 10 years ago, she actively pursued weight loss as treatment options and was successful for about 5 years has started to regain weight back. She is not back to her weight but was present at the time of diagnosis. Recently her sister had stayed with her at the house and witnessed her to snore heavily but also witnessed apneas again. Leslie Rice reported this to Dr. Darrin Nipper who would like her to be reevaluated for possible sleep apnea. Besides the weight gain she endorsed a decreased level of daytime energy, increased daytime sleepiness, and memory difficulties. She has a history of migraine and depression, pulmonary emboli-but she has family members affected by factor V Leiden she was tested negative repeatedly. At  the time of PE she was on hormonal birth control.  She was placed on Xeralto  and oxygen at night. Had also carpal tunnel surgery and C-section. She is a mother of 2 daughters 36 and 30.  Chief complaint according to patient : " snoring and pausing my breath "  Sleep habits are as follows: The patient has an early bedtime habits she usually goes to bed between 8 and 9 PM and she falls asleep promptly. She feels exhausted throughout the afternoon and early evening which leads to the early bedtime. She does  watch TV in bed, but goes to sleep nonetheless. She wakes up at least once when she goes to the bathroom, but usually goes  back to sleep quickly. She sleeps on one pillow and usually on her left side and her bedroom is usually quiet and cool (once she switches of the TV). She has to rise usually around 6:30 AM but often wakes up at 5 and sometimes wishes to sleep longer but can't. She has a very dry mouth at night and in the morning. Some mornings she will have a headache when waking, she has a headache today.  There is no nausea with these sleep related headaches. She also has migraine , only once in 3 month now.    Her insurance did not pay for treatment of sleep apnea 14 years ago and she advised me that her insurance will change again on August 1 for this reason I would like her to go into the sleep study ASAP.   Sleep medical history and family sleep history:  Sister has OSA on CPAP. All family members snore , she is the youngest of 74. 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   Social history: Divorced, 2 daughters 6 now 34 and 26 years old, daytime worker- but constantly on call, never feels off. Never used tobacco, ETOH : rarely- cay using headaches, caffeine: no coffee, but some soda 1 a day , no tea.     Review of Systems: Out of a complete 14 system review, the patient complains of only the following symptoms, and all other reviewed systems are negative.   Snoring, witnessed apnea.   How likely are you to doze in the following situations: 0 = not likely, 1 = slight chance, 2 = moderate chance, 3 = high chance  Sitting and Reading? Watching Television? Sitting inactive in a public place (theater or meeting)? Lying down in the afternoon when circumstances permit? Sitting and talking to someone? Sitting quietly after lunch without alcohol? In a car, while stopped for a few minutes in traffic? As a passenger in a car for an hour without a break?  Total = 10     Epworth score now up to 10 from  8/ 24  , Fatigue severity score  Up to 63 from 44/ 63 - very  high  , depression  score improved over the last 2 years - counseling. On armodafinil. She reports better concentration and productivity. Sleeps  8-10 hours daily! Still not refreshed, not restored.    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   No daily use of caffeine.          Social Determinants of Health   Financial Resource Strain: Not on file  Food Insecurity: Not on file  Transportation Needs: Not on file  Physical Activity: Not on file  Stress: Not on file  Social Connections: Not on file  Intimate Partner Violence: Not on file    Family History  Problem Relation Age of Onset   Colon cancer Other        M, aunt, nephew   CAD Father        age? "silent MIs"   Lung cancer Father    Diabetes Father        F, sisters x2   Thyroid cancer Sister    Colon cancer Sister        dx age 39   Breast cancer Neg Hx    Stroke Neg Hx     Past Medical History:  Diagnosis Date   *Depression 07/21/2008   Used to see a Veterinary surgeon. Meds RF by previous PCP     B12 deficiency 07/21/2008   Qualifier: Diagnosis of  By: Koleen Distance CMA Duncan Dull), Leisha     Chronic pain syndrome    thoracic back pain, seeing Dr. Ethelene Hal   DEPRESSION    DVT (deep venous thrombosis) (HCC)    Edema 07/21/2008   Qualifier: Diagnosis of  By: Koleen Distance CMA (AAMA), Hulan Saas     FATIGUE 07/21/2008   Qualifier: Diagnosis of  By: Koleen Distance CMA (AAMA), Leisha     GERD     Hyperlipidemia 07/21/2008   Intolerant to simvastatin and Pravachol    Hypersomnia with sleep apnea 10/20/2015   Insomnia 07/21/2008   Qualifier: Diagnosis of  By: Koleen Distance CMA (AAMA), Hulan Saas     Irritable bowel syndrome    Migraine headache 07/21/2008   Menstrual related, used to take HRT, now unable to due to clots Used to see Dr Sandria Manly      Morbid obesity due to excess calories (HCC) 10/20/2015   OSA (obstructive sleep apnea)    not on Cpap @ present 11-2013   Primary hypercoagulable state (HCC)    PULMONARY EMBOLISM, HX OF 2002 07/21/2008   Qualifier: Diagnosis of  By: Koleen Distance CMA (AAMA), Leisha     Seasonal allergies 07/26/2015   Sleep related headaches 10/20/2015   Vitamin D deficiency 07/21/2008   Qualifier: Diagnosis of  By: Koleen Distance CMA (AAMA), Hulan Saas      Past Surgical History:  Procedure Laterality Date   c section     x 2    CARPAL TUNNEL RELEASE     B   HYSTEROSCOPY W/ ENDOMETRIAL ABLATION  01/16/2019   NASAL SINUS SURGERY      Current Outpatient Medications  Medication Sig Dispense Refill   Armodafinil 250 MG tablet Take 1 tablet by mouth once daily 30 tablet 0   busPIRone (BUSPAR) 10 MG tablet Take 1/2 tablet three times daily for 7 days, then increase to one tablet three times daily. 90 tablet 2   HYDROcodone-acetaminophen (NORCO) 10-325 MG per tablet Take 1 tablet by mouth every 6 (six) hours as needed for  moderate pain.      hydrOXYzine (ATARAX/VISTARIL) 25 MG tablet Take 1 tablet (25 mg total) by mouth 3 (three) times daily as needed. 270 tablet 3   lurasidone (LATUDA) 40 MG TABS tablet Take 1 tablet (40 mg total) by mouth daily with breakfast. 90 tablet 3   promethazine (PHENERGAN) 25 MG tablet Take 1 tablet (25 mg total) by mouth every 6 (six) hours as needed for nausea or vomiting. 30 tablet 1   rivaroxaban (XARELTO) 20 MG TABS tablet Take 1 tablet (20 mg total) by mouth daily with supper. 90 tablet 3   SF 5000 PLUS 1.1 % CREA dental cream      SUMAtriptan (IMITREX) 50 MG  tablet One tablet by mouth at start of migraine.  May repeat in 2 hours if no improvement x 1 dose. 10 tablet 2   venlafaxine XR (EFFEXOR XR) 75 MG 24 hr capsule Take 1 capsule (75 mg total) by mouth daily with breakfast. 90 capsule 3   Vitamin D, Ergocalciferol, (DRISDOL) 1.25 MG (50000 UNIT) CAPS capsule Take 1 capsule (50,000 Units total) by mouth every 7 (seven) days. 12 capsule 0   No current facility-administered medications for this visit.    Allergies as of 01/31/2021 - Review Complete 01/31/2021  Allergen Reaction Noted   Ciprofloxacin     Codeine     Sulfonamide derivatives      Vitals: BP (!) 137/93   Pulse 75   Ht 5\' 3"  (1.6 m)   Wt 205 lb 8 oz (93.2 kg)   BMI 36.40 kg/m  Last Weight:  Wt Readings from Last 1 Encounters:  01/31/21 205 lb 8 oz (93.2 kg)   02/02/21 mass index is 36.4 kg/m.     Last Height:   Ht Readings from Last 1 Encounters:  01/31/21 5\' 3"  (1.6 m)    Physical exam:  General: The patient is awake, alert and appears not in acute distress. The patient is well groomed. Head: Normocephalic, atraumatic. Neck is supple. Mallampati 3, lateral crowding and peaked palate. neck circumference:1&. Nasal airflow patent ,  TMJ click is not evident . Prognathia is seen. Status post sinus surgery.  Cardiovascular:  Regular rate and rhythm, without  murmurs or carotid bruit, and without distended neck veins. Respiratory: Lungs are clear to auscultation. Skin:  Without evidence of edema, or rash Trunk: BMI is  39.5 . The patient's posture is erect.  Neurologic exam : The patient is awake and alert, oriented to place and time.   Memory subjective described as intact.  Attention span & concentration ability appears normal.  Speech is fluent,  without  dysarthria, nasal dysphonia or aphasia.  Mood and affect are appropriate.  Cranial nerves: Pupils are equal and briskly reactive to light. Funduscopic exam without evidence of pallor or edema. Extraocular  movements  in vertical and horizontal planes intact and without nystagmus. Visual fields by finger perimetry are intact. Hearing to finger rub intact.  Facial sensation intact to fine touch. Facial motor strength is symmetric and tongue and and her uvula move midline. Shoulder shrug was symmetrical.   Motor exam: Normal tone, muscle bulk and symmetric strength in all extremities.  Sensory:  Fine touch and vibration were normal . Carpal tunnel in both hands.   Coordination: Rapid alternating movements in the fingers/hands was normal. No changes in penmanship.   Finger-to-nose maneuver  normal without evidence of ataxia, dysmetria or tremor.  Gait and station: Patient walks without assistive device .   Deep  tendon reflexes: in the  upper and lower extremities are symmetric and intact. Babinski maneuver response is downgoing.    The patient was advised of the nature of the diagnosed sleep disorder , the treatment options and risks for general a health and wellness arising from not treating the condition.  I spent more than 30 minutes of face to face time with the patient.We have discussed the diagnosis and differential and I answered the patient's questions.     Assessment:  After physical and neurologic examination, review of laboratory studies,  Personal review of imaging studies, reports of other /same  Imaging studies ,  Results of polysomnography/ neurophysiology testing and pre-existing records as far as provided in visit., my assessment is   1)  Need to establish baseline again, ordered  HST.  Witnessed apnea, frequent apnea per Rice.   2) Armodafinil refilled for EDS   3) Fatigue severe. Still on call all the time, shift work.     Plan:  Treatment plan and additional workup : HST ordered, will need to look at REM versus NREM sleep apnea.   Porfirio Mylar Reggie Bise MD  01/31/2021   CC: Wanda Plump, Md [address] [place],  [place] [address]

## 2021-02-07 ENCOUNTER — Telehealth: Payer: Self-pay

## 2021-02-07 NOTE — Telephone Encounter (Signed)
LVM for pt to call me back to schedule sleep study  

## 2021-02-13 ENCOUNTER — Telehealth: Payer: Self-pay

## 2021-02-13 ENCOUNTER — Other Ambulatory Visit: Payer: Self-pay | Admitting: Internal Medicine

## 2021-02-13 NOTE — Telephone Encounter (Signed)
PA initiated via Covermymeds; KEY: [id number]. PA approved.   [id number];JMEQAS:[id number];Review Type:Prior Auth;Coverage Start Date:01/14/2021;Coverage End Date:02/13/2022

## 2021-02-15 ENCOUNTER — Ambulatory Visit (INDEPENDENT_AMBULATORY_CARE_PROVIDER_SITE_OTHER): Payer: 59 | Admitting: Neurology

## 2021-02-15 DIAGNOSIS — G4726 Circadian rhythm sleep disorder, shift work type: Secondary | ICD-10-CM

## 2021-02-15 DIAGNOSIS — G4733 Obstructive sleep apnea (adult) (pediatric): Secondary | ICD-10-CM

## 2021-02-15 DIAGNOSIS — G4719 Other hypersomnia: Secondary | ICD-10-CM

## 2021-03-14 NOTE — Progress Notes (Signed)
[facility] SLEEP TEST REPORT ( by Watch PAT)   STUDY DATE:  03-08-2021 DOB:  02/22/1966    ORDERING CLINICIAN: Melvyn Novas, MD  REFERRING CLINICIAN:    CLINICAL INFORMATION/HISTORY:  Leslie Rice is a 55 y.o. female patient who was here scheduled for 7:30 AM visit. She is here for a yearly migraine follow-up and is also needing refills on armodafinil.  She no longer uses Ajovy but feels that her migraines have been reduced to at mild most 2 events per month which she cannot control the sumatriptan alone.  The key is to get the migraine in the early onset stage.  BMI is at this time 36.4, blood pressure is only mildly elevated and we had performed in 2021 home sleep test to confirm the presence of obstructive to a which returned with such a mild AHI that I was concerned no CPAP would be necessary.  [name] daughter however has reported that she feels her mother is presenting with very apneic breathing that she has witnessed.    Epworth sleepiness score: 8/24.   BMI: 39.5 kg/m   Neck Circumference: 16   FINDINGS:   Sleep Summary:   Total Recording Time (hours, min): The total recording time for this watch pat home sleep test amounted to 8 hours and 47 minutes of which a total sleep time of 7 hours and 39 minutes was calculated.  15.4% of total sleep time were REM sleep.                                Respiratory Indices:   Calculated pAHI (per hour): The AHI was 1.6/h which is a very low apnea index and not indicative of any intervention being needed.  Even during REM sleep the AHI only was 4.2/h.                           REM pAHI and NREM pAHI:   4.2/h versus 1.1/h                           Positional AHI: In supine sleep the AHI was 0.9 and flat sleeping on the right side 2.0/h.  Snoring statistics indicate the presence of snoring for about 20% of the night with a mean volume of 41 dB.  This is an average level of snoring.                                                  Oxygen Saturation Statistics:   O2 Saturation Range (%):   Varied between a nadir at 92% and a maximum of 98% with a mean oxygen saturation of 95%.                                    O2 Saturation (minutes) <89%:  0 minutes.          Pulse Rate Statistics:   Pulse Range:   Varied between 59 and 84 beats per minute, with a mean heart rate of 67 bpm.  IMPRESSION:  This HST confirms again that no sleep apnea of clinical relevance is present.    RECOMMENDATION: There is neither hypoxia, nor bradycardia tachycardia nor very loud snoring noticed and there is no intervention called for by the low degree of sleep apnea seen here this is a absolute physiologically normal finding of less than 5 apneas per hour.  The patient has chronic insomnia du to chronic pain ( Dr Ethelene Hal) and due to depression    INTERPRETING PHYSICIAN:   Melvyn Novas, MD   Medical Director of [facility].

## 2021-03-21 ENCOUNTER — Telehealth: Payer: Self-pay | Admitting: *Deleted

## 2021-03-21 DIAGNOSIS — G4726 Circadian rhythm sleep disorder, shift work type: Secondary | ICD-10-CM | POA: Insufficient documentation

## 2021-03-21 DIAGNOSIS — G4719 Other hypersomnia: Secondary | ICD-10-CM | POA: Insufficient documentation

## 2021-03-21 NOTE — Telephone Encounter (Signed)
Called and spoke with pt. Relayed results per Dr. Oliva Bustard note. Pt verbalized understanding.  She asked what could be done about severe snoring. Offered referral to ENT for further eval/treatment. She declined at this time. Has gone years ago for chronic sinus infections/had deviated septum fixed. Also declined referral to dental office for dental device for snoring. Aware she would have to pay out of pocket for this.  She will call back if she changes her mind. For now, she will continue to monitor and plan on keeping f/u for March.

## 2021-03-21 NOTE — Telephone Encounter (Signed)
-----   Message from Melvyn Novas, MD sent at 03/21/2021 11:33 AM EST ----- Mild to moderate snoring but no apnea of clinical significance. NO CPAP or dental device for apnea treatment would be justified.

## 2021-03-21 NOTE — Procedures (Signed)
[facility]   HOME SLEEP TEST REPORT ( by Watch PAT)   STUDY DATE:  03-08-2021, data loaded 03-14-2021. DOB:  1965-06-09    ORDERING CLINICIAN: Melvyn Novas, MD  REFERRING CLINICIAN:    CLINICAL INFORMATION/HISTORY:  Leslie Rice is a 55 y.o. female patient who was here scheduled for 7:30 AM visit. She is here for a yearly migraine follow-up and is also needing refills on armodafinil.  She no longer uses Ajovy but feels that her migraines have been reduced to at mild most 2 events per month which she cannot control the sumatriptan alone.  The key is to get the migraine in the early onset stage.  BMI is at this time 36.4, blood pressure is only mildly elevated and we had performed in 2021 home sleep test to confirm the presence of obstructive to a which returned with such a mild AHI that I was concerned no CPAP would be necessary.  [name] daughter however has reported that she feels her mother is presenting with very apneic breathing that she has witnessed.    Epworth sleepiness score: 8/24.   BMI: 39.5 kg/m   Neck Circumference: 16   FINDINGS:   Sleep Summary:   Total Recording Time (hours, min): The total recording time for this watch pat home sleep test amounted to 8 hours and 47 minutes of which a total sleep time of 7 hours and 39 minutes was calculated.  15.4% of total sleep time were REM sleep.                                Respiratory Indices:   Calculated pAHI (per hour): The AHI was 1.6/h which is a very low apnea index and not indicative of any intervention being needed.  Even during REM sleep the AHI only was 4.2/h.                           REM pAHI and NREM pAHI:   4.2/h versus 1.1/h                           Positional AHI: In supine sleep the AHI was 0.9 and flat sleeping on the right side 2.0/h.  Snoring statistics indicate the presence of snoring for about 20% of the night with a mean volume of 41 dB.  This is an average level of snoring.                                                  Oxygen Saturation Statistics:   O2 Saturation Range (%):   Varied between a nadir at 92% and a maximum of 98% with a mean oxygen saturation of 95%.                                    O2 Saturation (minutes) <89%:  0 minutes.          Pulse Rate Statistics:   Pulse Range:   Varied between 59 and 84 beats per minute, with a mean heart rate of 67 bpm.  IMPRESSION:  This HST confirms again that no sleep apnea of clinical relevance is present.    RECOMMENDATION: There is neither hypoxia, nor bradycardia tachycardia nor very loud snoring noticed and there is no intervention called for by the low degree of sleep apnea seen here this is a absolute physiologically normal finding of less than 5 apneas per hour.  The patient has chronic insomnia du to chronic pain ( Dr Ethelene Hal) and due to depression    INTERPRETING PHYSICIAN:   Melvyn Novas, MD   Medical Director of [facility].

## 2021-03-24 ENCOUNTER — Ambulatory Visit: Payer: 59

## 2021-03-24 NOTE — Progress Notes (Deleted)
Patient here for second shingles vaccine.  Vaccine given in left deltoid and patient tolerated well.  

## 2021-04-21 ENCOUNTER — Encounter: Payer: Self-pay | Admitting: Internal Medicine

## 2021-06-22 ENCOUNTER — Ambulatory Visit: Payer: 59 | Admitting: Internal Medicine

## 2021-06-27 ENCOUNTER — Encounter: Payer: Self-pay | Admitting: Internal Medicine

## 2021-06-27 ENCOUNTER — Ambulatory Visit: Payer: 59 | Admitting: Internal Medicine

## 2021-07-03 NOTE — Progress Notes (Signed)
? ?PATIENT: Leslie Rice ?DOB: 1965-07-23 ? ?REASON FOR VISIT: follow up ?HISTORY FROM: patient ? ?Virtual Visit via Telephone Note ? ?I connected with Leslie Rice on 07/04/21 at  8:30 AM EDT by telephone and verified that I am speaking with the correct person using two identifiers. ?  ?I discussed the limitations, risks, security and privacy concerns of performing an evaluation and management service by telephone and the availability of in person appointments. I also discussed with the patient that there may be a patient responsible charge related to this service. The patient expressed understanding and agreed to proceed. ? ? ?History of Present Illness: ? ?07/04/21 ALL: ?BILLEE MACKER is a 56 y.o. female here today for follow up for hypersomnia. She was seen 01/2021 in consult with Dr Brett Fairy for fatigue and snoring. HST 02/15/2021 did not show any concerns of sleep breathing disorder. ENT referral offered for snoring but declined. She continues modafinil 250mg  daily. She is doing well and tolerating stimulant. She does not always take on the weekends. She is feeling well today.  ? ?History (copied from Dr Dohmeier's previous note) ? ?HPI:  [name] is a 56 y.o. female patient who was here scheduled for 7:30 AM visit.  I am very sorry.  And on 1 8.  She is here for a yearly migraine follow-up and is also needing refills on armodafinil.  She no longer uses Ajovy but feels that her migraines have been reduced to at mild most 2 events per month which she cannot control the sumatriptan alone.  The key is to get the migraine in the early onset stage.  BMI is at this time 36.4, blood pressure is only mildly elevated and we had performed in 2021 home sleep test to confirm the presence of obstructive sleep apnea which returned with such a mild AHI that I was concerned no CPAP would be necessary.  Mrs. [name] daughter however has reported that she feels her mother is presenting with very apneic breathing that  she has witnessed.  There was a question of the home sleep test may have not picked up a typical night or may have not picked up apnea that is truly there. ?I will repeat a home sleep test, but her insurance has changed from [place] to [place] healthcare.  I would like to add that the patient had never felt great benefit from using CPAP for the treatment of previously mild apnea obstructive and that now was over a year of not using CPAP she has still benefit in terms of her migraine not having returned.  Sleep is not always restorative but this is also related to stress at work. ? ? ?Observations/Objective: ? ?Generalized: Well developed, in no acute distress  ?Mentation: Alert oriented to time, place, history taking. Follows all commands speech and language fluent ? ? ?Assessment and Plan: ? ?56 y.o. year old female  has a past medical history of *Depression (07/21/2008), B12 deficiency (07/21/2008), Chronic pain syndrome, DEPRESSION, DVT (deep venous thrombosis) ([place]), Edema (07/21/2008), FATIGUE (07/21/2008), GERD, Hyperlipidemia (07/21/2008), Hypersomnia with sleep apnea (10/20/2015), Insomnia (07/21/2008), Irritable bowel syndrome, Migraine headache (07/21/2008), Morbid obesity due to excess calories ([place]) (10/20/2015), OSA (obstructive sleep apnea), Primary hypercoagulable state ([place]), PULMONARY EMBOLISM, HX OF 2002 (07/21/2008), Seasonal allergies (07/26/2015), Sleep related headaches (10/20/2015), and Vitamin D deficiency (07/21/2008). here with ? ?  ICD-10-CM   ?1. Hypersomnia  G47.10   ?  ? ?Shakirra is doing well, today. We will continue armodafinil 250mg  daily.  PDMP shows appropraite refills. Last filled 06/14/2021 and has 1 additional refill remaining. She will call when refills are due. I have reviewed Dr Dohmeier's recommendations for dental versus ENT referral for snoring. She is not interested at this time. Healthy lifestyle habits encouraged. She will follow up with me in 1 year, sooner if needed.  ? ?No orders of the  defined types were placed in this encounter. ? ? ?No orders of the defined types were placed in this encounter. ? ? ? ?Follow Up Instructions: ? ?I discussed the assessment and treatment plan with the patient. The patient was provided an opportunity to ask questions and all were answered. The patient agreed with the plan and demonstrated an understanding of the instructions. ?  ?The patient was advised to call back or seek an in-person evaluation if the symptoms worsen or if the condition fails to improve as anticipated. ? ?I provided 15 minutes of non-face-to-face time during this encounter. Patient located at their place of residence during [place] visit. Provider is in the office.  ? ? ?Debbora Presto, NP  ?

## 2021-07-03 NOTE — Patient Instructions (Signed)
Below is our plan:  We will continue armodafinil 250mg daily.   Please make sure you are staying well hydrated. I recommend 50-60 ounces daily. Well balanced diet and regular exercise encouraged. Consistent sleep schedule with 6-8 hours recommended.   Please continue follow up with care team as directed.   Follow up with me in 1 year   You may receive a survey regarding today's visit. I encourage you to leave honest feed back as I do use this information to improve patient care. Thank you for seeing me today!    

## 2021-07-04 ENCOUNTER — Telehealth (INDEPENDENT_AMBULATORY_CARE_PROVIDER_SITE_OTHER): Payer: 59 | Admitting: Family Medicine

## 2021-07-04 ENCOUNTER — Encounter: Payer: Self-pay | Admitting: Family Medicine

## 2021-07-04 DIAGNOSIS — G4710 Hypersomnia, unspecified: Secondary | ICD-10-CM | POA: Diagnosis not present

## 2021-08-09 ENCOUNTER — Other Ambulatory Visit: Payer: Self-pay | Admitting: Adult Health

## 2021-08-09 DIAGNOSIS — F3181 Bipolar II disorder: Secondary | ICD-10-CM

## 2021-09-07 ENCOUNTER — Other Ambulatory Visit: Payer: Self-pay | Admitting: Internal Medicine

## 2021-09-07 ENCOUNTER — Other Ambulatory Visit: Payer: Self-pay | Admitting: Adult Health

## 2021-09-07 ENCOUNTER — Other Ambulatory Visit: Payer: Self-pay | Admitting: Neurology

## 2021-09-07 DIAGNOSIS — G4700 Insomnia, unspecified: Secondary | ICD-10-CM

## 2021-09-07 DIAGNOSIS — F411 Generalized anxiety disorder: Secondary | ICD-10-CM

## 2021-09-07 DIAGNOSIS — G4733 Obstructive sleep apnea (adult) (pediatric): Secondary | ICD-10-CM

## 2021-09-07 NOTE — Telephone Encounter (Signed)
Last OV was on 07/04/21.  Next OV is scheduled for 07/17/22.  Last RX was written on 07/28/21 for 30 tabs.   [facility] has been reviewed. Please e-scribe as work in  MD. Dr. Vickey Huger is out.

## 2021-09-08 NOTE — Telephone Encounter (Signed)
Pt has an appt  on 6/6

## 2021-09-08 NOTE — Telephone Encounter (Signed)
Please schedule appt

## 2021-09-12 ENCOUNTER — Encounter: Payer: Self-pay | Admitting: Adult Health

## 2021-09-12 ENCOUNTER — Telehealth (INDEPENDENT_AMBULATORY_CARE_PROVIDER_SITE_OTHER): Payer: 59 | Admitting: Adult Health

## 2021-09-12 DIAGNOSIS — F331 Major depressive disorder, recurrent, moderate: Secondary | ICD-10-CM | POA: Diagnosis not present

## 2021-09-12 DIAGNOSIS — G4700 Insomnia, unspecified: Secondary | ICD-10-CM | POA: Diagnosis not present

## 2021-09-12 DIAGNOSIS — F3181 Bipolar II disorder: Secondary | ICD-10-CM | POA: Diagnosis not present

## 2021-09-12 DIAGNOSIS — F411 Generalized anxiety disorder: Secondary | ICD-10-CM | POA: Diagnosis not present

## 2021-09-12 MED ORDER — VENLAFAXINE HCL ER 75 MG PO CP24: 75.0000 mg | ORAL_CAPSULE | Freq: Every day | ORAL | 3 refills | Status: DC

## 2021-09-12 MED ORDER — HYDROXYZINE HCL 25 MG PO TABS: 25.0000 mg | ORAL_TABLET | Freq: Three times a day (TID) | ORAL | 3 refills | Status: DC | PRN

## 2021-09-12 MED ORDER — LURASIDONE HCL 40 MG PO TABS: 40.0000 mg | ORAL_TABLET | Freq: Every day | ORAL | 3 refills | Status: DC

## 2021-09-12 MED ORDER — BUSPIRONE HCL 10 MG PO TABS: ORAL_TABLET | ORAL | 2 refills | Status: DC

## 2021-09-12 NOTE — Progress Notes (Addendum)
Leslie Rice [id number] [birth year] 56 y.o.  Virtual Visit via Video Note  I connected with pt @ on 09/14/21 at 11:20 AM EDT by a video enabled telemedicine application and verified that I am speaking with the correct person using two identifiers.   I discussed the limitations of evaluation and management by telemedicine and the availability of in person appointments. The patient expressed understanding and agreed to proceed.  I discussed the assessment and treatment plan with the patient. The patient was provided an opportunity to ask questions and all were answered. The patient agreed with the plan and demonstrated an understanding of the instructions.   The patient was advised to call back or seek an in-person evaluation if the symptoms worsen or if the condition fails to improve as anticipated.  I provided 25 minutes of non-face-to-face time during this encounter.  The patient was located at home.  The provider was located at [facility].   Subjective:   Patient ID:  Leslie Rice is a 56 y.o. (DOB [birth year]) female.  Chief Complaint: No chief complaint on file.   HPI STORMIE VENTOLA presents to the office today for follow-up of MDD, GAD, insomnia. BPD2.   Describes mood today as "ok". Pleasant. Denies tearfulness. Mood symptoms - denies anxiety and depression. Feels irritable at times. Stating "I'm doing alright". Trying to be more aware of self and others - "not letting the little things bother me". She and partner have bought a house together and moved - "enjoying my pool". Daughters doing well. Work going well. Improved interest and motivation. Taking medications as prescribed.  Energy levels not great. Active, does not have a regular exercise routine.  Enjoys some usual interests and activities. Lives with partner. Has 2 grown daughters. Mostly staying home.  Appetite adequate. Weight loss and gain.  Sleeps better some nights than others. Averages 4 to 8 hours -  "it's all over the board". Taking Nuvigil. Focus and concentration improved. Completing tasks. Managing aspects of household. 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  Denies SI or HI. Denies AH or VH.  Previously seen by: Valinda Hoar   Previous medications: Zoloft, Wellbutrin, Lexapro, Effexor   PHQ2-9    Flowsheet Row Office Visit from 12/23/2020 in [facility] Office Visit from 12/09/2018 in [facility] Office Visit from 11/05/2016 in [facility] Office Visit from 03/26/2016 in [facility] Office Visit from 09/30/2015 in [facility]  PHQ-2 Total Score 1 1 0 0 0  PHQ-9 Total Score 3 8 0 -- --        Review of Systems:  Review of Systems  Musculoskeletal:  Negative for gait problem.  Neurological:  Negative for tremors.  Psychiatric/Behavioral:         Please refer to HPI   Medications: I have reviewed the patient's current medications.  Current Outpatient Medications  Medication Sig Dispense Refill   Armodafinil 250 MG tablet Take 1 tablet by mouth once daily 30 tablet 0   busPIRone (BUSPAR) 10 MG tablet Take 1/2 tablet three times daily for 7 days, then increase to one tablet three times daily. 90 tablet 2   HYDROcodone-acetaminophen (NORCO) 10-325 MG per tablet Take 1 tablet by mouth every 6 (six) hours as needed for moderate pain.      hydrOXYzine (ATARAX) 25 MG  tablet Take 1 tablet (25 mg total) by mouth 3 (three) times daily as needed. 90 tablet 3   lurasidone (LATUDA) 40 MG TABS tablet Take 1 tablet (40 mg total) by mouth daily with breakfast. 90 tablet 3   promethazine (PHENERGAN) 25 MG tablet Take 1 tablet (25 mg total) by mouth every 6 (six) hours as needed for nausea or vomiting. 30 tablet 1   rivaroxaban (XARELTO) 20 MG TABS tablet TAKE 1 TABLET BY MOUTH ONCE DAILY  WITH SUPPER 30 tablet 0   SF 5000 PLUS 1.1 % CREA dental cream      SUMAtriptan (IMITREX) 50 MG tablet One tablet by mouth at start of migraine.  May repeat in 2 hours if no improvement x 1 dose. 10 tablet 2   venlafaxine XR (EFFEXOR XR) 75 MG 24 hr capsule Take 1 capsule (75 mg total) by mouth daily with breakfast. 90 capsule 3   Vitamin D, Ergocalciferol, (DRISDOL) 1.25 MG (50000 UNIT) CAPS capsule Take 1 capsule (50,000 Units total) by mouth every 7 (seven) days. 12 capsule 0   No current facility-administered medications for this visit.    Medication Side Effects: None  Allergies:  Allergies  Allergen Reactions   Ciprofloxacin     REACTION: vomiting   Codeine     REACTION: vomiting   Sulfonamide Derivatives     hives    Past Medical History:  Diagnosis Date   *Depression 07/21/2008   Used to see a Veterinary surgeon. Meds RF by previous PCP     B12 deficiency 07/21/2008   Qualifier: Diagnosis of  By: Koleen Distance CMA Duncan Dull), Leisha     Chronic pain syndrome    thoracic back pain, seeing Dr. Ethelene Hal   DEPRESSION    DVT (deep venous thrombosis) (HCC)    Edema 07/21/2008   Qualifier: Diagnosis of  By: Koleen Distance CMA (AAMA), Hulan Saas     FATIGUE 07/21/2008   Qualifier: Diagnosis of  By: Koleen Distance CMA (AAMA), Leisha     GERD    Hyperlipidemia 07/21/2008   Intolerant to simvastatin and Pravachol    Hypersomnia with sleep apnea 10/20/2015   Insomnia 07/21/2008   Qualifier: Diagnosis of  By: Koleen Distance CMA (AAMA), Hulan Saas     Irritable bowel syndrome    Migraine headache 07/21/2008   Menstrual related, used to take HRT, now unable to due to clots Used to see Dr Sandria Manly      Morbid obesity due to excess calories (HCC) 10/20/2015   OSA (obstructive sleep apnea)    not on Cpap @ present 11-2013   Primary hypercoagulable state (HCC)    PULMONARY EMBOLISM, HX OF 2002 07/21/2008   Qualifier: Diagnosis of  By: Koleen Distance CMA (AAMA), Leisha     Seasonal allergies 07/26/2015   Sleep related headaches 10/20/2015   Vitamin D  deficiency 07/21/2008   Qualifier: Diagnosis of  By: Koleen Distance CMA (AAMA), Hulan Saas      Past Medical History, Surgical history, Social history, and Family history were reviewed and updated as appropriate.   Please see review of systems for further details on the patient's review from today.   Objective:   Physical Exam:  There were no vitals taken for this visit.  Physical Exam Constitutional:      General: She is not in acute distress. Musculoskeletal:        General: No deformity.  Neurological:     Mental Status: She is alert and oriented to person, place, and time.     Coordination: Coordination normal.  Psychiatric:        Attention and Perception: Attention and perception normal. She does not perceive auditory or visual hallucinations.        Mood and Affect: Mood normal. Mood is not anxious or depressed. Affect is not labile, blunt, angry or inappropriate.        Speech: Speech normal.        Behavior: Behavior normal.        Thought Content: Thought content normal. Thought content is not paranoid or delusional. Thought content does not include homicidal or suicidal ideation. Thought content does not include homicidal or suicidal plan.        Cognition and Memory: Cognition and memory normal.        Judgment: Judgment normal.     Comments: Insight intact    Lab Review:     Component Value Date/Time   NA 142 12/23/2020 0845   K 4.9 12/23/2020 0845   CL 106 12/23/2020 0845   CO2 28 12/23/2020 0845   GLUCOSE 88 12/23/2020 0845   BUN 12 12/23/2020 0845   CREATININE 0.93 12/23/2020 0845   CREATININE 0.85 12/23/2019 0852   CALCIUM 9.4 12/23/2020 0845   PROT 6.9 12/23/2020 0845   ALBUMIN 4.4 12/23/2020 0845   AST 83 (H) 12/23/2020 0845   ALT 64 (H) 12/23/2020 0845   ALKPHOS 91 12/23/2020 0845   BILITOT 0.4 12/23/2020 0845   GFRNONAA >60 03/18/2018 0347   GFRAA >60 03/18/2018 0347       Component Value Date/Time   WBC 4.3 12/23/2020 0845   RBC 4.56 12/23/2020 0845    HGB 14.0 12/23/2020 0845   HCT 41.6 12/23/2020 0845   PLT 195.0 12/23/2020 0845   MCV 91.4 12/23/2020 0845   MCH 30.8 12/23/2019 0852   MCHC 33.6 12/23/2020 0845   RDW 11.9 12/23/2020 0845   LYMPHSABS 1.9 12/23/2020 0845   MONOABS 0.3 12/23/2020 0845   EOSABS 0.1 12/23/2020 0845   BASOSABS 0.0 12/23/2020 0845    No results found for: POCLITH, LITHIUM   No results found for: PHENYTOIN, PHENOBARB, VALPROATE, CBMZ   .res Assessment: Plan:    Plan:  Restart Buspar 10mg  TID - 3 times daily for anxiety and irritability  Effexor XR 75mg  daily  Latuda 40mg  daily Hydroxyzine 25mg  tablet - 1 to 3 at bedtime - helps with allergies.  Armodafonil 250mg  daily - sleep apnea. Neurologist will resume at next appointment in November - Nuvigil or Provigil.  RTC 6 months  Patient advised to contact office with any questions, adverse effects, or acute worsening in signs and symptoms.  Discussed potential benefits, risk, and side effects of benzodiazepines to include potential risk of tolerance and dependence, as well as possible drowsiness.  Advised patient not to drive if experiencing drowsiness and to take lowest possible effective dose to minimize risk of dependence and tolerance.  Discussed potential metabolic side effects associated with atypical antipsychotics, as well as potential risk for movement side effects. Advised pt to contact office if movement side effects occur.  Diagnoses and all orders for this visit:  Bipolar II disorder (HCC) -     lurasidone (LATUDA) 40 MG TABS tablet; Take 1 tablet (40 mg total) by mouth daily with breakfast.  Major depressive disorder, recurrent episode, moderate (HCC) -     venlafaxine XR (EFFEXOR XR) 75 MG 24 hr capsule; Take 1 capsule (75 mg total) by mouth daily with breakfast.  Generalized anxiety disorder -     venlafaxine XR [facility]  XR) 75 MG 24 hr capsule; Take 1 capsule (75 mg total) by mouth daily with breakfast. -     hydrOXYzine  (ATARAX) 25 MG tablet; Take 1 tablet (25 mg total) by mouth 3 (three) times daily as needed. -     busPIRone (BUSPAR) 10 MG tablet; Take 1/2 tablet three times daily for 7 days, then increase to one tablet three times daily.  Insomnia, unspecified type -     hydrOXYzine (ATARAX) 25 MG tablet; Take 1 tablet (25 mg total) by mouth 3 (three) times daily as needed.     Please see After Visit Summary for patient specific instructions.  Future Appointments  Date Time Provider Department Center  07/17/2022  8:15 AM Lomax, Amy, NP [facility] None    No orders of the defined types were placed in this encounter.   -------------------------------

## 2021-10-12 ENCOUNTER — Other Ambulatory Visit: Payer: Self-pay | Admitting: Neurology

## 2021-10-12 DIAGNOSIS — G4733 Obstructive sleep apnea (adult) (pediatric): Secondary | ICD-10-CM

## 2021-10-12 NOTE — Telephone Encounter (Signed)
Verify Drug Registry For Armodafinil 250 Mg Tablet Last Filled: 09/11/2021 Quantity: 30 tablets for 30 days Last appointment: 07/04/2021 Next appointment: 07/17/2022

## 2021-12-04 ENCOUNTER — Encounter: Payer: Self-pay | Admitting: Internal Medicine

## 2021-12-05 ENCOUNTER — Telehealth: Payer: Self-pay | Admitting: Internal Medicine

## 2021-12-05 MED ORDER — RIVAROXABAN 20 MG PO TABS: 20.0000 mg | ORAL_TABLET | Freq: Every day | ORAL | 0 refills | Status: DC

## 2021-12-05 NOTE — Telephone Encounter (Signed)
Rx sent 

## 2021-12-05 NOTE — Telephone Encounter (Signed)
Medication: rivaroxaban (XARELTO) 20 MG TABS tablet [[id number]]   Has the patient contacted their pharmacy?   Pharmacy advised that she has to be seen for additional refills. Patient has 3 pills left and cannot get in to see Dr. Drue Novel before mid September. Appt scheduled but she would like to know if she can get additional prescription until that appt.   Preferred Pharmacy (with phone number or street name): [facility]  Agent: Please be advised that RX refills may take up to 3 business days. We ask that you follow-up with your pharmacy.

## 2021-12-06 ENCOUNTER — Other Ambulatory Visit: Payer: Self-pay | Admitting: Neurology

## 2021-12-06 DIAGNOSIS — G4733 Obstructive sleep apnea (adult) (pediatric): Secondary | ICD-10-CM

## 2021-12-06 MED ORDER — ARMODAFINIL 250 MG PO TABS: 250.0000 mg | ORAL_TABLET | Freq: Every day | ORAL | 3 refills | Status: DC

## 2021-12-25 ENCOUNTER — Encounter: Payer: Self-pay | Admitting: Internal Medicine

## 2021-12-25 ENCOUNTER — Ambulatory Visit: Payer: 59 | Admitting: Internal Medicine

## 2021-12-25 VITALS — BP 126/84 | HR 74 | Temp 98.3°F | Resp 18 | Ht 63.0 in | Wt 223.4 lb

## 2021-12-25 DIAGNOSIS — E559 Vitamin D deficiency, unspecified: Secondary | ICD-10-CM

## 2021-12-25 DIAGNOSIS — D6859 Other primary thrombophilia: Secondary | ICD-10-CM | POA: Diagnosis not present

## 2021-12-25 DIAGNOSIS — R7989 Other specified abnormal findings of blood chemistry: Secondary | ICD-10-CM | POA: Diagnosis not present

## 2021-12-25 DIAGNOSIS — Z1211 Encounter for screening for malignant neoplasm of colon: Secondary | ICD-10-CM

## 2021-12-25 DIAGNOSIS — Z23 Encounter for immunization: Secondary | ICD-10-CM

## 2021-12-25 DIAGNOSIS — E538 Deficiency of other specified B group vitamins: Secondary | ICD-10-CM

## 2021-12-25 DIAGNOSIS — M1991 Primary osteoarthritis, unspecified site: Secondary | ICD-10-CM | POA: Diagnosis not present

## 2021-12-25 LAB — CBC WITH DIFFERENTIAL/PLATELET
Basophils Absolute: 0 10*3/uL (ref 0.0–0.1)
Basophils Relative: 0.5 % (ref 0.0–3.0)
Eosinophils Absolute: 0.5 10*3/uL (ref 0.0–0.7)
Eosinophils Relative: 10.8 % — ABNORMAL HIGH (ref 0.0–5.0)
HCT: 40.7 % (ref 36.0–46.0)
Hemoglobin: 13.9 g/dL (ref 12.0–15.0)
Lymphocytes Relative: 37.7 % (ref 12.0–46.0)
Lymphs Abs: 1.6 10*3/uL (ref 0.7–4.0)
MCHC: 34 g/dL (ref 30.0–36.0)
MCV: 90.3 fl (ref 78.0–100.0)
Monocytes Absolute: 0.3 10*3/uL (ref 0.1–1.0)
Monocytes Relative: 6.1 % (ref 3.0–12.0)
Neutro Abs: 1.9 10*3/uL (ref 1.4–7.7)
Neutrophils Relative %: 44.9 % (ref 43.0–77.0)
Platelets: 177 10*3/uL (ref 150.0–400.0)
RBC: 4.51 Mil/uL (ref 3.87–5.11)
RDW: 13.1 % (ref 11.5–15.5)
WBC: 4.2 10*3/uL (ref 4.0–10.5)

## 2021-12-25 LAB — B12 AND FOLATE PANEL
Folate: 7.4 ng/mL (ref >5.9)
Vitamin B-12: 256 pg/mL (ref 211–911)

## 2021-12-25 LAB — COMPREHENSIVE METABOLIC PANEL
ALT: 16 U/L (ref 0–35)
AST: 19 U/L (ref 0–37)
Albumin: 4.1 g/dL (ref 3.5–5.2)
Alkaline Phosphatase: 90 U/L (ref 39–117)
BUN: 17 mg/dL (ref 6–23)
CO2: 23 mEq/L (ref 19–32)
Calcium: 9 mg/dL (ref 8.4–10.5)
Chloride: 107 mEq/L (ref 96–112)
Creatinine, Ser: 0.89 mg/dL (ref 0.40–1.20)
GFR: 73.76 mL/min (ref >60.00)
Glucose, Bld: 91 mg/dL (ref 70–99)
Potassium: 4.6 mEq/L (ref 3.5–5.1)
Sodium: 140 mEq/L (ref 135–145)
Total Bilirubin: 0.4 mg/dL (ref 0.2–1.2)
Total Protein: 6.9 g/dL (ref 6.0–8.3)

## 2021-12-25 MED ORDER — RIVAROXABAN 20 MG PO TABS: 20.0000 mg | ORAL_TABLET | Freq: Every day | ORAL | 1 refills | Status: DC

## 2021-12-25 NOTE — Patient Instructions (Addendum)
Take vitamin D 3: 2000 units every day Take a vitamin B 12  supplement daily Both are over-the-counter   Call your gastroenterologist in [facility] and schedule for a follow-up colonoscopy   GO TO THE LAB : Get the blood work     [place], PLEASE SCHEDULE YOUR APPOINTMENTS Come back for a physical exam in 4 months

## 2021-12-25 NOTE — Assessment & Plan Note (Signed)
Hypercoagulable state: On Xarelto, check CBC Increased LFTs: LFTs were slightly elevated last year, no EtOH, taking approximately 1600 mg of Tylenol a day at most.  Plan: CMP, hep B&C serology. Trigger finger, fourth right digit: Plans to see Ortho DJD, chronic pain syndrome: Patient worries about autoimmune arthritis, ROS does not point in the direction, no synovitis on physical exam.  We talk about possibly some blood work (sed rate, ANA, RF), she declines for now. Vitamin D deficiency: S/p ergocalciferol, recommend to start vitamin D supplements OTC B12 deficiency: History of, rec to start qd supplements  Preventive care: Received GI letter, due for colonoscopy, referral placed, encouraged to call them Flu shot and Shingrix No. 2 today RTC 4 months CPX

## 2021-12-25 NOTE — Progress Notes (Signed)
Subjective:    Patient ID: Leslie Rice, female    DOB: [birth year], 56 y.o.   MRN: [id number]  DOS:  12/25/2021 Type of visit - description: Follow-up  Since the last office visit she is doing okay.  We talk about her chronic medical problems  LFTs were noted to be elevated, denies EtOH, not taking excessive Tylenol.  Has a 4th right trigger finger.  She also has aches and pains in general, wonders if she has something else other than OA. Denies fever chills. No unusual rash, no synovitis although her hands feel very swelling in the mornings. No oral ulcers.   Review of Systems See above   Past Medical History:  Diagnosis Date   *Depression 07/21/2008   Used to see a Veterinary surgeon. Meds RF by previous PCP     B12 deficiency 07/21/2008   Qualifier: Diagnosis of  By: Koleen Distance CMA Duncan Dull), Hulan Saas     Chronic pain syndrome    thoracic back pain, seeing Dr. Ethelene Hal   DEPRESSION    DVT (deep venous thrombosis) (HCC)    Edema 07/21/2008   Qualifier: Diagnosis of  By: Koleen Distance CMA (AAMA), Hulan Saas     FATIGUE 07/21/2008   Qualifier: Diagnosis of  By: Koleen Distance CMA (AAMA), Leisha     GERD    Hyperlipidemia 07/21/2008   Intolerant to simvastatin and Pravachol    Hypersomnia with sleep apnea 10/20/2015   Insomnia 07/21/2008   Qualifier: Diagnosis of  By: Koleen Distance CMA (AAMA), Hulan Saas     Irritable bowel syndrome    Migraine headache 07/21/2008   Menstrual related, used to take HRT, now unable to due to clots Used to see Dr Sandria Manly      Morbid obesity due to excess calories (HCC) 10/20/2015   OSA (obstructive sleep apnea)    not on Cpap @ present 11-2013   Primary hypercoagulable state (HCC)    PULMONARY EMBOLISM, HX OF 2002 07/21/2008   Qualifier: Diagnosis of  By: Koleen Distance CMA (AAMA), Leisha     Seasonal allergies 07/26/2015   Sleep related headaches 10/20/2015   Vitamin D deficiency 07/21/2008   Qualifier: Diagnosis of  By: Koleen Distance CMA Duncan Dull), Hulan Saas      Past Surgical History:  Procedure Laterality  Date   c section     x 2    CARPAL TUNNEL RELEASE     B   HYSTEROSCOPY W/ ENDOMETRIAL ABLATION  01/16/2019   NASAL SINUS SURGERY      Current Outpatient Medications  Medication Instructions   Armodafinil 250 mg, Oral, Daily   cholecalciferol (VITAMIN D3) 2,000 Units, Oral, Daily   HYDROcodone-acetaminophen (NORCO) 10-325 MG per tablet 1 tablet, Oral, Every 6 hours PRN   hydrOXYzine (ATARAX) 25 mg, Oral, 3 times daily PRN   lurasidone (LATUDA) 40 mg, Oral, Daily with breakfast   promethazine (PHENERGAN) 25 mg, Oral, Every 6 hours PRN   rivaroxaban (XARELTO) 20 mg, Oral, Daily with supper   SF 5000 PLUS 1.1 % CREA dental cream No dose, route, or frequency recorded.   SUMAtriptan (IMITREX) 50 MG tablet One tablet by mouth at start of migraine.  May repeat in 2 hours if no improvement x 1 dose.   venlafaxine XR (EFFEXOR XR) 75 mg, Oral, Daily with breakfast       Objective:   Physical Exam BP 126/84   Pulse 74   Temp 98.3 F (36.8 C) (Oral)   Resp 18   Ht 5\' 3"  (1.6 m)   Wt 223 lb  6 oz (101.3 kg)   SpO2 98%   BMI 39.57 kg/m  General:   Well developed, NAD, BMI noted. HEENT:  Normocephalic . Face symmetric, atraumatic Lungs:  CTA B Normal respiratory effort, no intercostal retractions, no accessory muscle use. Heart: RRR,  no murmur.  Lower extremities: no pretibial edema Hands and wrists: No synovitis.  No trigger phenomenon noted today. Skin: Not pale. Not jaundice Neurologic:  alert & oriented X3.  Speech normal, gait appropriate for age and unassisted Psych--  Cognition and judgment appear intact.  Cooperative with normal attention span and concentration.  Behavior appropriate. No anxious or depressed appearing.      Assessment    Assessment HTN, mild. Primary hypercoagulable state, change Coumadin to Xarelto 05-2014 -PE 2002 while on HRT, saw Dr Beryle Beams ;+ FH, testing on her was (-);  rx coumadin since 2002 for life  -superficial phlebitis 11-2014 -  PE DVT 03/2018 (d/t non compliant w/ xarelto) Hyperlipidemia: Pravachol, simvastatin caused aches  Morbidly obese Psych: ---Depression, anxiety,  insomnia: since divorce 2007  ---Polysubstance abuse, admitted 08/12/2020 Migraine headaches: sees neuro as of 12/2018 .on imitrex per pcp.  Used to take BB, Topamax IBS  OSA:  + test before , repeat sleep study + 2017    Saw neurology 01-2020, they rx HST done 03/2020: Insignificant OSA.  On Armodafinil Vit B12 and Vit D def-- poor compliance w/ supplements Chronic pain syndrome: hydrocodone Rx per Dr. Nelva Bush Chest pain: Saw cards 12/2018, echo: LVH, nuclear stress test negative. Endometrial ablation 01/2019  PLAN Hypercoagulable state: On Xarelto, check CBC Increased LFTs: LFTs were slightly elevated last year, no EtOH, taking approximately 1600 mg of Tylenol a day at most.  Plan: CMP, hep B&C serology. Trigger finger, fourth right digit: Plans to see Ortho DJD, chronic pain syndrome: Patient worries about autoimmune arthritis, ROS does not point in the direction, no synovitis on physical exam.  We talk about possibly some blood work (sed rate, ANA, RF), she declines for now. Vitamin D deficiency: S/p ergocalciferol, recommend to start vitamin D supplements OTC B12 deficiency: History of, rec to start qd supplements  Preventive care: Received GI letter, due for colonoscopy, referral placed, encouraged to call them Flu shot and Shingrix No. 2 today RTC 4 months CPX

## 2021-12-26 LAB — HEPATITIS B SURFACE ANTIBODY,QUALITATIVE: Hep B S Ab: NONREACTIVE

## 2021-12-26 LAB — HEPATITIS B CORE ANTIBODY, TOTAL: Hep B Core Total Ab: NONREACTIVE

## 2021-12-26 LAB — HEPATITIS B SURFACE ANTIGEN: Hepatitis B Surface Ag: NONREACTIVE

## 2021-12-26 LAB — HEPATITIS C ANTIBODY: Hepatitis C Ab: NONREACTIVE

## 2022-01-09 ENCOUNTER — Encounter: Payer: Self-pay | Admitting: Internal Medicine

## 2022-01-30 ENCOUNTER — Telehealth: Payer: Self-pay

## 2022-01-30 NOTE — Telephone Encounter (Signed)
PA initiated via Covermymeds; KEY: [id number].   PA cancelled by plan.   Drug is covered by current benefit plan. No further PA activity needed

## 2022-03-14 ENCOUNTER — Telehealth (INDEPENDENT_AMBULATORY_CARE_PROVIDER_SITE_OTHER): Payer: 59 | Admitting: Adult Health

## 2022-03-14 ENCOUNTER — Encounter: Payer: Self-pay | Admitting: Adult Health

## 2022-03-14 DIAGNOSIS — F411 Generalized anxiety disorder: Secondary | ICD-10-CM | POA: Diagnosis not present

## 2022-03-14 DIAGNOSIS — G4700 Insomnia, unspecified: Secondary | ICD-10-CM | POA: Diagnosis not present

## 2022-03-14 DIAGNOSIS — F331 Major depressive disorder, recurrent, moderate: Secondary | ICD-10-CM | POA: Diagnosis not present

## 2022-03-14 DIAGNOSIS — F3181 Bipolar II disorder: Secondary | ICD-10-CM | POA: Diagnosis not present

## 2022-03-14 MED ORDER — HYDROXYZINE HCL 25 MG PO TABS: 25.0000 mg | ORAL_TABLET | Freq: Three times a day (TID) | ORAL | 3 refills | Status: DC | PRN

## 2022-03-14 NOTE — Progress Notes (Signed)
Leslie Rice [id number] [birth year] 56 y.o.  Virtual Visit via Video Note  I connected with pt @ on 03/14/22 at 10:00 AM EST by a video enabled telemedicine application and verified that I am speaking with the correct person using two identifiers.   I discussed the limitations of evaluation and management by telemedicine and the availability of in person appointments. The patient expressed understanding and agreed to proceed.  I discussed the assessment and treatment plan with the patient. The patient was provided an opportunity to ask questions and all were answered. The patient agreed with the plan and demonstrated an understanding of the instructions.   The patient was advised to call back or seek an in-person evaluation if the symptoms worsen or if the condition fails to improve as anticipated.  I provided 25 minutes of non-face-to-face time during this encounter.  The patient was located at home.  The provider was located at [facility].   Leslie Gibbs, NP   Subjective:   Patient ID:  Leslie Rice is a 56 y.o. (DOB [birth year]) female.  Chief Complaint: No chief complaint on file.   HPI Leslie Rice presents for follow-up of MDD, GAD, insomnia. BPD2.   Describes mood today as "ok". Pleasant.Tearful at times. Mood symptoms - reports some anxiety and depression. Reports irritability - "sometimes". Reports some situational stressors - daughter. Mood is variable. Stating "I'm doing alright, not as happy as I should be". Feels like medications are helpful. Work going well. Varying interest and motivation. Taking medications as prescribed.  Energy levels not great. Active, does not have a regular exercise routine.  Enjoys some usual interests and activities. Recent cruise. Lives with partner. Has 2 grown daughters - Leslie Rice and Leslie Rice.  Appetite adequate. Weight gain.  Reports sleeping difficulties - in the bed for 8 hours - broken sleep - would like to restart  the Hydroxyzine. Focus and concentration good. Completing tasks. Managing aspects of household. 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  Denies SI or HI. Denies AH or VH.  Previously seen by: Leslie Rice   Previous medications: Zoloft, Wellbutrin, Lexapro, Effexor   Review of Systems:  Review of Systems  Musculoskeletal:  Negative for gait problem.  Neurological:  Negative for tremors.  Psychiatric/Behavioral:         Please refer to HPI    Medications: I have reviewed the patient's current medications.  Current Outpatient Medications  Medication Sig Dispense Refill   Armodafinil 250 MG tablet Take 1 tablet (250 mg total) by mouth daily. 90 tablet 3   cholecalciferol (VITAMIN D3) 25 MCG (1000 UNIT) tablet Take 2,000 Units by mouth daily.     HYDROcodone-acetaminophen (NORCO) 10-325 MG per tablet Take 1 tablet by mouth every 6 (six) hours as needed for moderate pain.      hydrOXYzine (ATARAX) 25 MG tablet Take 1 tablet (25 mg total) by mouth 3 (three) times daily as needed. 90 tablet 3   lurasidone (LATUDA) 40 MG TABS tablet Take 1 tablet (40 mg total) by mouth daily with breakfast. 90 tablet 3   promethazine (PHENERGAN) 25 MG tablet Take 1 tablet (25 mg total) by mouth every 6 (six) hours as needed for nausea or vomiting. (Patient not taking: Reported on 12/25/2021) 30 tablet 1   rivaroxaban (XARELTO) 20 MG TABS tablet Take 1 tablet (20 mg total) by mouth daily with supper. 90 tablet 1   SF 5000 PLUS 1.1 % CREA dental cream  SUMAtriptan (IMITREX) 50 MG tablet One tablet by mouth at start of migraine.  May repeat in 2 hours if no improvement x 1 dose. 10 tablet 2   venlafaxine XR (EFFEXOR XR) 75 MG 24 hr capsule Take 1 capsule (75 mg total) by mouth daily with breakfast. 90 capsule 3   No current facility-administered medications for this visit.    Medication Side Effects: None  Allergies:  Allergies  Allergen Reactions   Ciprofloxacin     REACTION: vomiting   Codeine      REACTION: vomiting   Sulfonamide Derivatives     hives    Past Medical History:  Diagnosis Date   *Depression 07/21/2008   Used to see a Veterinary surgeoncounselor. Meds RF by previous PCP     B12 deficiency 07/21/2008   Qualifier: Diagnosis of  By: Leslie DistanceKowalk CMA Leslie Rice(AAMA), Leslie Rice     Chronic pain syndrome    thoracic back pain, seeing Leslie Rice   DEPRESSION    DVT (deep venous thrombosis) (HCC)    Edema 07/21/2008   Qualifier: Diagnosis of  By: Leslie DistanceKowalk CMA (AAMA), Leslie Rice     FATIGUE 07/21/2008   Qualifier: Diagnosis of  By: Leslie DistanceKowalk CMA (AAMA), Leslie Rice     GERD    Hyperlipidemia 07/21/2008   Intolerant to simvastatin and Pravachol    Hypersomnia with sleep apnea 10/20/2015   Insomnia 07/21/2008   Qualifier: Diagnosis of  By: Leslie DistanceKowalk CMA (AAMA), Leslie Rice     Irritable bowel syndrome    Migraine headache 07/21/2008   Menstrual related, used to take HRT, now unable to due to clots Used to see Leslie Rice      Morbid obesity due to excess calories (HCC) 10/20/2015   OSA (obstructive sleep apnea)    not on Cpap @ present 11-2013   Primary hypercoagulable state (HCC)    PULMONARY EMBOLISM, HX OF 2002 07/21/2008   Qualifier: Diagnosis of  By: Leslie DistanceKowalk CMA (AAMA), Leslie Rice     Seasonal allergies 07/26/2015   Sleep related headaches 10/20/2015   Vitamin D deficiency 07/21/2008   Qualifier: Diagnosis of  By: Leslie DistanceKowalk CMA Leslie Rice(AAMA), Leslie Rice      Family History  Problem Relation Age of Onset   Colon cancer Other        M, aunt, nephew   CAD Father        age? 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   No daily use of caffeine.  Social Determinants of Health   Financial Resource Strain: Not on file  Food Insecurity: Not on file  Transportation Needs: Not on file  Physical Activity: Not on file  Stress: Not on file  Social Connections: Not on file  Intimate Partner Violence: Not on file    Past Medical History, Surgical history, Social history, and Family history were reviewed and updated as appropriate.   Please see review of systems for further details on the patient's review from today.   Objective:   Physical Exam:  There were no vitals taken for this visit.  Physical Exam Constitutional:      General: She is not in acute distress. Musculoskeletal:        General: No deformity.  Neurological:     Mental Status: She is alert and oriented to person, place, and time.     Coordination: Coordination normal.  Psychiatric:        Attention and Perception: Attention and perception normal. She does not perceive auditory or visual hallucinations.        Mood and Affect: Mood normal. Mood is not anxious or depressed. Affect is not labile, blunt, angry or inappropriate.        Speech: Speech normal.        Behavior: Behavior normal.        Thought Content: Thought content normal. Thought content is not paranoid or delusional. Thought content does not include homicidal or suicidal ideation. Thought content does not include homicidal or suicidal plan.        Cognition and Memory: Cognition and memory normal.        Judgment: Judgment normal.     Comments: Insight intact     Lab Review:     Component Value Date/Time   NA 140 12/25/2021  1145   K 4.6 12/25/2021 1145   CL 107 12/25/2021 1145   CO2 23 12/25/2021 1145   GLUCOSE 91 12/25/2021 1145   BUN 17 12/25/2021 1145   CREATININE 0.89 12/25/2021 1145   CREATININE 0.85 12/23/2019 0852   CALCIUM 9.0 12/25/2021 1145   PROT 6.9 12/25/2021 1145   ALBUMIN 4.1 12/25/2021 1145   AST 19 12/25/2021 1145   ALT 16 12/25/2021 1145   ALKPHOS 90 12/25/2021 1145   BILITOT 0.4 12/25/2021 1145   GFRNONAA >60 03/18/2018 0347   GFRAA >60 03/18/2018 0347       Component Value Date/Time   WBC 4.2 12/25/2021 1145   RBC 4.51 12/25/2021 1145   HGB 13.9 12/25/2021 1145   HCT 40.7 12/25/2021 1145   PLT 177.0 12/25/2021 1145   MCV 90.3 12/25/2021 1145   MCH 30.8 12/23/2019 0852   MCHC 34.0 12/25/2021 1145   RDW 13.1 12/25/2021 1145   LYMPHSABS 1.6 12/25/2021 1145   MONOABS 0.3 12/25/2021 1145   EOSABS 0.5 12/25/2021 1145   BASOSABS 0.0 12/25/2021 1145    No results found for: "POCLITH", "LITHIUM"   No results found for: "PHENYTOIN", "PHENOBARB", "VALPROATE", "CBMZ"   .res Assessment: Plan:    Plan:  Effexor XR 75mg  daily  Latuda 40mg  daily Hydroxyzine 25mg  tablet - 1 to 3 at bedtime.  Armodafonil 250mg  daily - sleep apnea. Neurologist will resume at next appointment in November - Nuvigil or Provigil.  RTC 6 months  Patient advised to contact office with any questions, adverse effects, or acute worsening in signs and symptoms.  Discussed potential benefits, risk, and side effects of benzodiazepines to include potential risk of tolerance and dependence, as well as  possible drowsiness.  Advised patient not to drive if experiencing drowsiness and to take lowest possible effective dose to minimize risk of dependence and tolerance.  Discussed potential metabolic side effects associated with atypical antipsychotics, as well as potential risk for movement side effects. Advised pt to contact office if movement side effects occur.   Time spent with patient was 25 minutes.  Greater than 50% of face to face time with patient was spent on counseling and coordination of care.    There are no diagnoses linked to this encounter.   Please see After Visit Summary for patient specific instructions.  Future Appointments  Date Time Provider Department Center  03/14/2022 10:00 AM Darica Goren, Thereasa Solo, NP [facility] None  04/26/2022 10:40 AM Wanda Plump, MD [facility]  07/17/2022  8:15 AM Lomax, Amy, NP [facility] None    No orders of the defined types were placed in this encounter.     -------------------------------

## 2022-03-28 ENCOUNTER — Encounter: Payer: Self-pay | Admitting: Internal Medicine

## 2022-03-28 ENCOUNTER — Telehealth: Payer: Self-pay | Admitting: *Deleted

## 2022-03-28 NOTE — Telephone Encounter (Signed)
Faxed completed/signed PA armodafinil to [facility] at [phone]. Received fax confirmation.

## 2022-03-28 NOTE — Telephone Encounter (Signed)
Received fax from [facility] that PA approved 02/26/22-03/28/23. Case ID: [id number]

## 2022-04-26 ENCOUNTER — Ambulatory Visit: Payer: 59 | Admitting: Internal Medicine

## 2022-04-27 ENCOUNTER — Ambulatory Visit: Payer: 59 | Admitting: Internal Medicine

## 2022-04-27 ENCOUNTER — Encounter: Payer: Self-pay | Admitting: Internal Medicine

## 2022-04-27 VITALS — BP 122/84 | HR 90 | Temp 98.1°F | Resp 16 | Ht 63.0 in | Wt 220.2 lb

## 2022-04-27 DIAGNOSIS — E785 Hyperlipidemia, unspecified: Secondary | ICD-10-CM | POA: Diagnosis not present

## 2022-04-27 DIAGNOSIS — F419 Anxiety disorder, unspecified: Secondary | ICD-10-CM | POA: Diagnosis not present

## 2022-04-27 DIAGNOSIS — F32A Depression, unspecified: Secondary | ICD-10-CM

## 2022-04-27 DIAGNOSIS — Z1231 Encounter for screening mammogram for malignant neoplasm of breast: Secondary | ICD-10-CM

## 2022-04-27 LAB — LIPID PANEL
Cholesterol: 329 mg/dL — ABNORMAL HIGH (ref 0–200)
HDL: 44.1 mg/dL (ref >39.00)
NonHDL: 284.77
Total CHOL/HDL Ratio: 7
Triglycerides: 215 mg/dL — ABNORMAL HIGH (ref 0.0–149.0)
VLDL: 43 mg/dL — ABNORMAL HIGH (ref 0.0–40.0)

## 2022-04-27 LAB — LDL CHOLESTEROL, DIRECT: Direct LDL: 224 mg/dL

## 2022-04-27 NOTE — Patient Instructions (Addendum)
Vaccines I recommend:  Covid booster   You have an appointment with [facility] on 07/06/2022.  Not sure if it is a office visit or actual colonoscopy Please call them and clarify.  [facility] : Get the blood work     [place], [place] back for   a checkup in 6 months

## 2022-04-27 NOTE — Progress Notes (Signed)
Subjective:    Patient ID: Leslie Rice, female    DOB: [birth year], 57 y.o.   MRN: [id number]  DOS:  04/27/2022 Type of visit - description: Follow-up  Since the last visit reports good compliance with medication. Has no major concerns. Denies bleeding such as blood per rectum, gross hematuria. Emotionally doing okay. Continue with fatigue without chest pain no difficulty breathing. Fatigue is a chronic issue  Review of Systems See above   Past Medical History:  Diagnosis Date   *Depression 07/21/2008   Used to see a Social worker. Meds RF by previous PCP     B12 deficiency 07/21/2008   Qualifier: Diagnosis of  By: Nils Pyle CMA Deborra Medina), Mearl Latin     Chronic pain syndrome    thoracic back pain, seeing Dr. Nelva Bush   DEPRESSION    DVT (deep venous thrombosis) ([place])    Edema 07/21/2008   Qualifier: Diagnosis of  By: Nils Pyle CMA (AAMA), Mearl Latin     FATIGUE 07/21/2008   Qualifier: Diagnosis of  By: Nils Pyle CMA (AAMA), Leisha     GERD    Hyperlipidemia 07/21/2008   Intolerant to simvastatin and Pravachol    Hypersomnia with sleep apnea 10/20/2015   Insomnia 07/21/2008   Qualifier: Diagnosis of  By: Nils Pyle CMA (AAMA), Mearl Latin     Irritable bowel syndrome    Migraine headache 07/21/2008   Menstrual related, used to take HRT, now unable to due to clots Used to see Dr Erling Cruz      Morbid obesity due to excess calories ([place]) 10/20/2015   OSA (obstructive sleep apnea)    not on Cpap @ present 11-2013   Primary hypercoagulable state ([place])    PULMONARY EMBOLISM, HX OF 2002 07/21/2008   Qualifier: Diagnosis of  By: Nils Pyle CMA (AAMA), Leisha     Seasonal allergies 07/26/2015   Sleep related headaches 10/20/2015   Vitamin D deficiency 07/21/2008   Qualifier: Diagnosis of  By: Nils Pyle CMA Deborra Medina), Mearl Latin      Past Surgical History:  Procedure Laterality Date   c section     x 2    CARPAL TUNNEL RELEASE     B   HYSTEROSCOPY W/ ENDOMETRIAL ABLATION  01/16/2019   NASAL SINUS SURGERY      Current Outpatient  Medications  Medication Instructions   Armodafinil 250 mg, Oral, Daily   cholecalciferol (VITAMIN D3) 2,000 Units, Oral, Daily   HYDROcodone-acetaminophen (NORCO) 10-325 MG per tablet 1 tablet, Oral, Every 6 hours PRN   hydrOXYzine (ATARAX) 25 mg, Oral, 3 times daily PRN   lurasidone (LATUDA) 40 mg, Oral, Daily with breakfast   promethazine (PHENERGAN) 25 mg, Oral, Every 6 hours PRN   rivaroxaban (XARELTO) 20 mg, Oral, Daily with supper   SF 5000 PLUS 1.1 % CREA dental cream No dose, route, or frequency recorded.   SUMAtriptan (IMITREX) 50 MG tablet One tablet by mouth at start of migraine.  May repeat in 2 hours if no improvement x 1 dose.   venlafaxine XR (EFFEXOR XR) 75 mg, Oral, Daily with breakfast       Objective:   Physical Exam BP 122/84   Pulse 90   Temp 98.1 F (36.7 C) (Oral)   Resp 16   Ht 5\' 3"  (1.6 m)   Wt 220 lb 4 oz (99.9 kg)   SpO2 99%   BMI 39.02 kg/m  General:   Well developed, NAD, BMI noted. HEENT:  Normocephalic . Face symmetric, atraumatic Lungs:  CTA B Normal respiratory effort, no  intercostal retractions, no accessory muscle use. Heart: RRR,  no murmur.  Lower extremities: no pretibial edema Skin: Not pale. Not jaundice Neurologic:  alert & oriented X3.  Speech normal, gait appropriate for age and unassisted Psych--  Cognition and judgment appear intact.  Cooperative with normal attention span and concentration.  Behavior appropriate. No anxious or depressed appearing.      Assessment     Assessment HTN, mild. Primary hypercoagulable state, change Coumadin to Xarelto 05-2014 -PE 2002 while on HRT, saw Dr Beryle Beams ;+ FH, testing on her was (-);  rx coumadin since 2002 for life  -superficial phlebitis 11-2014 - PE DVT 03/2018 (d/t non compliant w/ xarelto) Hyperlipidemia: Pravachol, simvastatin caused aches  Morbidly obese Psych: ---Depression, anxiety,  insomnia: since divorce 2007  --- neuro Rx amodafinil  ---Polysubstance abuse,  admitted 08/12/2020 Migraine headaches: sees neuro as of 12/2018 .on imitrex per pcp.  Used to take BB, Topamax IBS  OSA:  + test before , repeat sleep study + 2017    Saw neurology 01-2020, they rx HST done 03/2020: Insignificant OSA.  On Armodafinil Home sleep study November 2022: Negative Vit B12 and Vit D def Chronic pain syndrome: hydrocodone Rx per Dr. Nelva Bush Chest pain: Saw cards 12/2018, echo: LVH, nuclear stress test negative. Endometrial ablation 01/2019  PLAN HTN: On no medicines.  BP today is very good. Hyperlipidemia: Previously Pravachol and simvastatin cause aches.  Last LDL 195.  We are rechecking today, she started to eat healthier 3 weeks ago and plans to continue doing so.  She will be reluctant to retry statins.  Zetia?Marland Kitchen Morbid obesity: She could qualify for medications but GLP-1 agonist are in short supply and   hard to obtain.  Consider the wellness clinic. Depression anxiety: She is  doing "just okay", no suicidal ideas, follow-up elsewhere. Fatigue: Reports ongoing fatigue.  No obvious etiology, likely multifactorial including depression, medications, deconditioning.  Sleep studies have been negative and vitamin levels normal.  No anemia.Observation. DJD, chronic pain syndrome: Follow-up elsewhere.  She also has fatigue, fibromyalgia? Preventive care: Recommend to call GI for colonoscopy Mammogram ordered. COVID booster discussed RTC 6 months

## 2022-04-29 NOTE — Assessment & Plan Note (Signed)
HTN: On no medicines.  BP today is very good. Hyperlipidemia: Previously Pravachol and simvastatin cause aches.  Last LDL 195.  We are rechecking today, she started to eat healthier 3 weeks ago and plans to continue doing so.  She will be reluctant to retry statins.  Zetia?Marland Kitchen Morbid obesity: She could qualify for medications but GLP-1 agonist are in short supply and   hard to obtain.  Consider the wellness clinic. Depression anxiety: She is  doing "just okay", no suicidal ideas, follow-up elsewhere. Fatigue: Reports ongoing fatigue.  No obvious etiology, likely multifactorial including depression, medications, deconditioning.  Sleep studies have been negative and vitamin levels normal.  No anemia.Observation. DJD, chronic pain syndrome: Follow-up elsewhere.  She also has fatigue, fibromyalgia? Preventive care: Recommend to call GI for colonoscopy Mammogram ordered. COVID booster discussed RTC 6 months

## 2022-05-04 ENCOUNTER — Telehealth (HOSPITAL_BASED_OUTPATIENT_CLINIC_OR_DEPARTMENT_OTHER): Payer: Self-pay

## 2022-06-25 ENCOUNTER — Other Ambulatory Visit: Payer: Self-pay

## 2022-06-25 ENCOUNTER — Other Ambulatory Visit: Payer: Self-pay | Admitting: Family Medicine

## 2022-06-25 DIAGNOSIS — G4733 Obstructive sleep apnea (adult) (pediatric): Secondary | ICD-10-CM

## 2022-06-25 MED ORDER — ARMODAFINIL 250 MG PO TABS: 250.0000 mg | ORAL_TABLET | Freq: Every day | ORAL | 0 refills | Status: DC

## 2022-06-25 NOTE — Telephone Encounter (Signed)
Pt is requesting a refill for Armodafinil 250 MG tablet .  Pharmacy: [facility] [phone]

## 2022-06-25 NOTE — Telephone Encounter (Signed)
Per drug registry last Refilled 03/28/22 #90 Last seen 07/04/2021 Next Appointment is 07/17/2022

## 2022-07-10 NOTE — Progress Notes (Signed)
PATIENT: ALAYZHA CERRITO DOB: 03/01/1966  REASON FOR VISIT: follow up HISTORY FROM: patient  Virtual Visit via Telephone Note  I connected with Vincenza Hews on 07/17/22 at  8:15 AM EDT by telephone and verified that I am speaking with the correct person using two identifiers.   I discussed the limitations, risks, security and privacy concerns of performing an evaluation and management service by telephone and the availability of in person appointments. I also discussed with the patient that there may be a patient responsible charge related to this service. The patient expressed understanding and agreed to proceed.   History of Present Illness:  07/17/22 ALL (Mychart): Ymani returns for follow up for hypersomnia. She continues armodafinil 250mg  QD. Last filled 06/25/2022. She knows that it helps but does not feel it is as effective as it was. She continues to have daytime sleepiness during work day. She is sleeping well. She has had two migraines over the past few months. Sumatriptan usually helps some but not completely.   07/04/2021 ALL (Mychart):  SOPHEE KOCOUREK is a 57 y.o. female here today for follow up for hypersomnia. She was seen 01/2021 in consult with Dr Vickey Huger for fatigue and snoring. HST 02/15/2021 did not show any concerns of sleep breathing disorder. ENT referral offered for snoring but declined. She continues armodafinil 250mg  daily. She is doing well and tolerating stimulant. She does not always take on the weekends. She is feeling well today.   History (copied from Dr Dohmeier's previous note)  HPI:  KARAN HOLLAWAY is a 57 y.o. female patient who was here scheduled for 7:30 AM visit.  I am very sorry.  And on 1 8.  She is here for a yearly migraine follow-up and is also needing refills on armodafinil.  She no longer uses Ajovy but feels that her migraines have been reduced to at mild most 2 events per month which she cannot control the sumatriptan alone.  The key is to get  the migraine in the early onset stage.  BMI is at this time 36.4, blood pressure is only mildly elevated and we had performed in 2021 home sleep test to confirm the presence of obstructive sleep apnea which returned with such a mild AHI that I was concerned no CPAP would be necessary.  Mrs. Schrack daughter however has reported that she feels her mother is presenting with very apneic breathing that she has witnessed.  There was a question of the home sleep test may have not picked up a typical night or may have not picked up apnea that is truly there. I will repeat a home sleep test, but her insurance has changed from [place] to [place] [employer].  I would like to add that the patient had never felt great benefit from using CPAP for the treatment of previously mild apnea obstructive and that now was over a year of not using CPAP she has still benefit in terms of her migraine not having returned.  Sleep is not always restorative but this is also related to stress at work.   Observations/Objective:  Generalized: Well developed, in no acute distress  Mentation: Alert oriented to time, place, history taking. Follows all commands speech and language fluent   Assessment and Plan:  57 y.o. year old female  has a past medical history of *Depression (07/21/2008), B12 deficiency (07/21/2008), Chronic pain syndrome, DEPRESSION, DVT (deep venous thrombosis), Edema (07/21/2008), FATIGUE (07/21/2008), GERD, Hyperlipidemia (07/21/2008), Hypersomnia with sleep apnea (10/20/2015), Insomnia (07/21/2008), Irritable  bowel syndrome, Migraine headache (07/21/2008), Morbid obesity due to excess calories (10/20/2015), OSA (obstructive sleep apnea), Primary hypercoagulable state, PULMONARY EMBOLISM, HX OF 2002 (07/21/2008), Seasonal allergies (07/26/2015), Sleep related headaches (10/20/2015), and Vitamin D deficiency (07/21/2008). here with    ICD-10-CM   1. Hypersomnia  G47.10     2. Nonintractable migraine, unspecified migraine type   G43.009 SUMAtriptan (IMITREX) 100 MG tablet      Erminie is doing well, today, however does not feel armodafinil is as effective as it was in the past. I will switch her to modafinil 200mg  daily. I will increase sumatriptan to 100mg  as needed. Appropriate dosing discussed. She will monitor for any worsening depression. Healthy lifestyle habits encouraged. She will follow up with me in 1 year, sooner if needed.   No orders of the defined types were placed in this encounter.   Meds ordered this encounter  Medications   modafinil (PROVIGIL) 200 MG tablet    Sig: Take 1 tablet (200 mg total) by mouth daily.    Dispense:  90 tablet    Refill:  1    Order Specific Question:   Supervising Provider    Answer:   Anson Fret [[id number]]   SUMAtriptan (IMITREX) 100 MG tablet    Sig: One tablet by mouth at start of migraine.  May repeat in 2 hours if no improvement x 1 dose.    Dispense:  10 tablet    Refill:  1    Order Specific Question:   Supervising Provider    Answer:   Anson Fret [id number]     Follow Up Instructions:  I discussed the assessment and treatment plan with the patient. The patient was provided an opportunity to ask questions and all were answered. The patient agreed with the plan and demonstrated an understanding of the instructions.   The patient was advised to call back or seek an in-person evaluation if the symptoms worsen or if the condition fails to improve as anticipated.  I provided 15 minutes of non-face-to-face time during this encounter. Patient located at their place of residence during Mychart visit. Provider is in the office.    Shawnie Dapper, NP

## 2022-07-10 NOTE — Patient Instructions (Addendum)
Below is our plan:  We will switch armodafinil to modafinil 200mg  daily. I have increased the sumatriptan dose to 100mg  as needed for migraines. Please take 1 tablet at onset of headache. May take 1 additional tablet in 2 hours if needed. Do not take more than 2 tablets in 24 hours or more than 10 in a month.   Please monitor for any worsening depression on modafinil. This medication can decrease the effectiveness of Latuda.   Please make sure you are staying well hydrated. I recommend 50-60 ounces daily. Well balanced diet and regular exercise encouraged. Consistent sleep schedule with 6-8 hours recommended.   Please continue follow up with care team as directed.   Follow up with me in 6 months  You may receive a survey regarding today's visit. I encourage you to leave honest feed back as I do use this information to improve patient care. Thank you for seeing me today!

## 2022-07-16 ENCOUNTER — Telehealth: Payer: Self-pay | Admitting: *Deleted

## 2022-07-16 NOTE — Telephone Encounter (Signed)
I called patient to see if she has been using her cpap, I checked data in [employer] and last data was 04/26/2021 - 05/25/2021. I called patient to see if she is still using cpap machine. Pt said she was told by Dr. Vickey Huger she no longer needs to use cpap machine.

## 2022-07-17 ENCOUNTER — Encounter: Payer: Self-pay | Admitting: Family Medicine

## 2022-07-17 ENCOUNTER — Telehealth: Payer: 59 | Admitting: Family Medicine

## 2022-07-17 DIAGNOSIS — G4710 Hypersomnia, unspecified: Secondary | ICD-10-CM

## 2022-07-17 DIAGNOSIS — G43009 Migraine without aura, not intractable, without status migrainosus: Secondary | ICD-10-CM | POA: Diagnosis not present

## 2022-07-17 MED ORDER — SUMATRIPTAN SUCCINATE 100 MG PO TABS: ORAL_TABLET | ORAL | 1 refills | Status: DC

## 2022-07-17 MED ORDER — MODAFINIL 200 MG PO TABS: 200.0000 mg | ORAL_TABLET | Freq: Every day | ORAL | 1 refills | Status: DC

## 2022-07-20 ENCOUNTER — Encounter: Payer: Self-pay | Admitting: Internal Medicine

## 2022-08-09 ENCOUNTER — Other Ambulatory Visit: Payer: Self-pay | Admitting: Internal Medicine

## 2022-08-15 ENCOUNTER — Other Ambulatory Visit: Payer: Self-pay | Admitting: Adult Health

## 2022-08-15 DIAGNOSIS — F3181 Bipolar II disorder: Secondary | ICD-10-CM

## 2022-08-15 DIAGNOSIS — F331 Major depressive disorder, recurrent, moderate: Secondary | ICD-10-CM

## 2022-08-15 DIAGNOSIS — F411 Generalized anxiety disorder: Secondary | ICD-10-CM

## 2022-09-17 ENCOUNTER — Telehealth: Payer: Self-pay

## 2022-09-17 NOTE — Telephone Encounter (Signed)
Pt saw Amy Last 07/2022 VV

## 2022-09-17 NOTE — Telephone Encounter (Signed)
Patient has migraines. She would like a form for window tint to be completed. First she would like to know if Dr. Vickey Huger would approve her to get window tint. She is requesting someone send her information about the process.

## 2022-09-19 ENCOUNTER — Encounter: Payer: Self-pay | Admitting: Adult Health

## 2022-09-19 ENCOUNTER — Telehealth (INDEPENDENT_AMBULATORY_CARE_PROVIDER_SITE_OTHER): Payer: 59 | Admitting: Adult Health

## 2022-09-19 DIAGNOSIS — G4700 Insomnia, unspecified: Secondary | ICD-10-CM | POA: Diagnosis not present

## 2022-09-19 DIAGNOSIS — F411 Generalized anxiety disorder: Secondary | ICD-10-CM

## 2022-09-19 MED ORDER — TRAZODONE HCL 50 MG PO TABS: ORAL_TABLET | ORAL | 5 refills | Status: DC

## 2022-09-19 NOTE — Progress Notes (Signed)
Leslie Rice [id number] October 10, 1965 57 y.o.  Virtual Visit via Video Note  I connected with pt @ on 09/19/22 at 10:00 AM EDT by a video enabled telemedicine application and verified that I am speaking with the correct person using two identifiers.   I discussed the limitations of evaluation and management by telemedicine and the availability of in person appointments. The patient expressed understanding and agreed to proceed.  I discussed the assessment and treatment plan with the patient. The patient was provided an opportunity to ask questions and all were answered. The patient agreed with the plan and demonstrated an understanding of the instructions.   The patient was advised to call back or seek an in-person evaluation if the symptoms worsen or if the condition fails to improve as anticipated.  I provided 25 minutes of non-face-to-face time during this encounter. The patient was located at home. The provider was located at [facility].   Dorothyann Gibbs, NP   Subjective:   Patient ID:  Leslie Rice is a 57 y.o. (DOB 1965/10/29) female.  Chief Complaint: No chief complaint on file.   HPI BERTICE RISSE presents for follow-up of MDD, GAD, insomnia. BPD2.   Describes mood today as "ok". Pleasant. Denies tearfulness. Mood symptoms - reports some anxiety - more situational. Denies depression. Reports irritability - "at times". Reports over thinking - "always". Reports situational stressors - daughter - relationship. Mood is consistently "blah". Stating "I'm doing alright". Feels like medications are helpful. Work going well. Varying interest and motivation. Taking medications as prescribed.  Energy levels lower. Active, does not have a regular exercise routine.  Enjoys some usual interests and activities. Lives with partner. Has 2 grown daughters. Appetite adequate. Weight stable - working on weight loss. Reports sleeping difficulties - averages 5.5 hours of broken  sleep. Focus and concentration difficulties. Completing tasks. Managing aspects of household. 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  Denies SI or HI. Denies AH or VH. Denies self harm. Denies substance use.  Previously seen by: Valinda Hoar   Previous medications: Zoloft, Wellbutrin, Lexapro, Effexor   Review of Systems:  Review of Systems  Musculoskeletal:  Negative for gait problem.  Neurological:  Negative for tremors.  Psychiatric/Behavioral:         Please refer to HPI    Medications: I have reviewed the patient's current medications.  Current Outpatient Medications  Medication Sig Dispense Refill   cholecalciferol (VITAMIN D3) 25 MCG (1000 UNIT) tablet Take 2,000 Units by mouth daily.     HYDROcodone-acetaminophen (NORCO) 10-325 MG per tablet Take 1 tablet by mouth every 6 (six) hours as needed for moderate pain.      hydrOXYzine (ATARAX) 25 MG tablet Take 1 tablet (25 mg total) by mouth 3 (three) times daily as needed. 270 tablet 3   lurasidone (LATUDA) 40 MG TABS tablet TAKE 1 TABLET DAILY WITH BREAKFAST 90 tablet 1   modafinil (PROVIGIL) 200 MG tablet Take 1 tablet (200 mg total) by mouth daily. 90 tablet 1   promethazine (PHENERGAN) 25 MG tablet Take 1 tablet (25 mg total) by mouth every 6 (six) hours as needed for nausea or vomiting. (Patient not taking: Reported on 12/25/2021) 30 tablet 1   rivaroxaban (XARELTO) 20 MG TABS tablet Take 1 tablet (20 mg total) by mouth daily with supper. 90 tablet 3   SF 5000 PLUS 1.1 % CREA dental cream      SUMAtriptan (IMITREX) 100 MG tablet One tablet by mouth  at start of migraine.  May repeat in 2 hours if no improvement x 1 dose. 10 tablet 1   venlafaxine XR (EFFEXOR-XR) 75 MG 24 hr capsule TAKE 1 CAPSULE DAILY WITH BREAKFAST 90 capsule 1   No current facility-administered medications for this visit.    Medication Side Effects: None  Allergies:  Allergies  Allergen Reactions   Ciprofloxacin     REACTION: vomiting    Codeine     REACTION: vomiting   Sulfonamide Derivatives     hives    Past Medical History:  Diagnosis Date   *Depression 07/21/2008   Used to see a Veterinary surgeon. Meds RF by previous PCP     B12 deficiency 07/21/2008   Qualifier: Diagnosis of  By: Koleen Distance CMA Duncan Dull), Leisha     Chronic pain syndrome    thoracic back pain, seeing Dr. Ethelene Hal   DEPRESSION    DVT (deep venous thrombosis) (HCC)    Edema 07/21/2008   Qualifier: Diagnosis of  By: Koleen Distance CMA (AAMA), Hulan Saas     FATIGUE 07/21/2008   Qualifier: Diagnosis of  By: Koleen Distance CMA (AAMA), Leisha     GERD    Hyperlipidemia 07/21/2008   Intolerant to simvastatin and Pravachol    Hypersomnia with sleep apnea 10/20/2015   Insomnia 07/21/2008   Qualifier: Diagnosis of  By: Koleen Distance CMA (AAMA), Hulan Saas     Irritable bowel syndrome    Migraine headache 07/21/2008   Menstrual related, used to take HRT, now unable to due to clots Used to see Dr Sandria Manly      Morbid obesity due to excess calories (HCC) 10/20/2015   OSA (obstructive sleep apnea)    not on Cpap @ present 11-2013   Primary hypercoagulable state (HCC)    PULMONARY EMBOLISM, HX OF 2002 07/21/2008   Qualifier: Diagnosis of  By: Koleen Distance CMA (AAMA), Leisha     Seasonal allergies 07/26/2015   Sleep related headaches 10/20/2015   Vitamin D deficiency 07/21/2008   Qualifier: Diagnosis of  By: Koleen Distance CMA Duncan Dull), Hulan Saas      Family History  Problem Relation Age of Onset   Colon cancer Other        M, aunt, nephew   CAD Father        age? 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   No daily use of caffeine.          Social Determinants of Corporate investment banker  Strain: Not on file  Food Insecurity: Not on file  Transportation Needs: Not on file  Physical Activity: Not on file  Stress: Not on file  Social Connections: Not on file  Intimate Partner Violence: Not on file    Past Medical History, Surgical history, Social history, and Family history were reviewed and updated as appropriate.   Please see review of systems for further details on the patient's review from today.   Objective:   Physical Exam:  There were no vitals taken for this visit.  Physical Exam Constitutional:      General: She is not in acute distress. Musculoskeletal:        General: No deformity.  Neurological:     Mental Status: She is alert and oriented to person, place, and time.     Coordination: Coordination normal.  Psychiatric:        Attention and Perception: Attention and perception normal. She does not perceive auditory or visual hallucinations.        Mood and Affect: Mood normal. Mood is not anxious or depressed. Affect is not labile, blunt, angry or inappropriate.        Speech: Speech normal.        Behavior: Behavior normal.        Thought Content: Thought content normal. Thought content is not paranoid or delusional. Thought content does not include homicidal or suicidal ideation. Thought content does not include homicidal or suicidal plan.        Cognition and Memory: Cognition and memory normal.        Judgment: Judgment normal.     Comments: Insight intact     Lab Review:     Component Value Date/Time   NA 140  12/25/2021 1145   K 4.6 12/25/2021 1145   CL 107 12/25/2021 1145   CO2 23 12/25/2021 1145   GLUCOSE 91 12/25/2021 1145   BUN 17 12/25/2021 1145   CREATININE 0.89 12/25/2021 1145   CREATININE 0.85 12/23/2019 0852   CALCIUM 9.0 12/25/2021 1145   PROT 6.9 12/25/2021 1145   ALBUMIN 4.1 12/25/2021 1145   AST 19 12/25/2021 1145   ALT 16 12/25/2021 1145   ALKPHOS 90 12/25/2021 1145   BILITOT 0.4 12/25/2021 1145   GFRNONAA >60 03/18/2018 0347   GFRAA >60 03/18/2018 0347       Component Value Date/Time   WBC 4.2 12/25/2021 1145   RBC 4.51 12/25/2021 1145   HGB 13.9 12/25/2021 1145   HCT 40.7 12/25/2021 1145   PLT 177.0 12/25/2021 1145   MCV 90.3 12/25/2021 1145   MCH 30.8 12/23/2019 0852   MCHC 34.0 12/25/2021 1145   RDW 13.1 12/25/2021 1145   LYMPHSABS 1.6 12/25/2021 1145   MONOABS 0.3 12/25/2021 1145   EOSABS 0.5 12/25/2021 1145   BASOSABS 0.0 12/25/2021 1145    No results found for: "POCLITH", "LITHIUM"   No results found for: "PHENYTOIN", "PHENOBARB", "VALPROATE", "CBMZ"   .res Assessment: Plan:    Plan:  Effexor XR 75mg  daily  Latuda 40mg  daily  D/C Hydroxyzine 25mg  tablet - 1 to 3 at bedtime. Add Trazadone 50mg  at hs for sleep.  Armodafonil 250mg  daily - sleep apnea. Neurologist will resume at next appointment in November - Nuvigil or Provigil.  RTC 6 months  Patient advised to contact office with any questions, adverse effects, or acute worsening in signs and symptoms.  Discussed potential benefits, risk, and side effects of benzodiazepines to include potential risk of tolerance and dependence, as well  as possible drowsiness.  Advised patient not to drive if experiencing drowsiness and to take lowest possible effective dose to minimize risk of dependence and tolerance.  Discussed potential metabolic side effects associated with atypical antipsychotics, as well as potential risk for movement side effects. Advised pt to contact office if movement side effects  occur.  Time spent with patient was 25 minutes. Greater than 50% of face to face time with patient was spent on counseling and coordination of care.    There are no diagnoses linked to this encounter.   Please see After Visit Summary for patient specific instructions.  Future Appointments  Date Time Provider Department Center  09/19/2022 10:00 AM Haygen Zebrowski, Thereasa Solo, NP [facility] None  10/26/2022 10:00 AM Wanda Plump, MD [facility]  01/22/2023  8:45 AM Lomax, Amy, NP [facility] None    No orders of the defined types were placed in this encounter.     -------------------------------

## 2022-10-16 ENCOUNTER — Telehealth: Payer: Self-pay | Admitting: Family Medicine

## 2022-10-16 MED ORDER — ARMODAFINIL 250 MG PO TABS: 250.0000 mg | ORAL_TABLET | Freq: Every day | ORAL | 5 refills | Status: DC

## 2022-10-16 NOTE — Telephone Encounter (Signed)
Pt said modafinil (PROVIGIL) 200 MG tablet only work for a couple of hours. Also need forms filled out to be able to have window tint put on because of migraines.

## 2022-10-16 NOTE — Telephone Encounter (Signed)
Spoke to pt.  She will come by tomorrow with DMV form for window tint.  (Around 1500 or so).  If amy L can sign great if not envelope to mail.  She said also the modafinil not working only for about 2 hours.  She is ok to go back to armodafinil 250mg  po daily. [place] drug registry checked last fill 09-16-2022 #30.

## 2022-10-17 NOTE — Telephone Encounter (Signed)
DMV window tint application signed. Given to pt. Copy made.(Scanned)

## 2022-10-26 ENCOUNTER — Ambulatory Visit: Payer: 59 | Admitting: Internal Medicine

## 2022-11-02 ENCOUNTER — Encounter: Payer: Self-pay | Admitting: Internal Medicine

## 2022-11-02 ENCOUNTER — Telehealth: Payer: Self-pay

## 2022-11-02 ENCOUNTER — Ambulatory Visit: Payer: 59 | Admitting: Internal Medicine

## 2022-11-02 VITALS — BP 126/86 | HR 76 | Temp 98.1°F | Resp 18 | Ht 63.0 in | Wt 227.1 lb

## 2022-11-02 DIAGNOSIS — I10 Essential (primary) hypertension: Secondary | ICD-10-CM | POA: Diagnosis not present

## 2022-11-02 DIAGNOSIS — Z0000 Encounter for general adult medical examination without abnormal findings: Secondary | ICD-10-CM | POA: Diagnosis not present

## 2022-11-02 DIAGNOSIS — Z6841 Body Mass Index (BMI) 40.0 and over, adult: Secondary | ICD-10-CM

## 2022-11-02 DIAGNOSIS — E559 Vitamin D deficiency, unspecified: Secondary | ICD-10-CM

## 2022-11-02 DIAGNOSIS — E785 Hyperlipidemia, unspecified: Secondary | ICD-10-CM

## 2022-11-02 DIAGNOSIS — Z0001 Encounter for general adult medical examination with abnormal findings: Secondary | ICD-10-CM

## 2022-11-02 LAB — CBC WITH DIFFERENTIAL/PLATELET
Absolute Monocytes: 260 cells/uL (ref 200–950)
Basophils Absolute: 20 cells/uL (ref 0–200)
Basophils Relative: 0.4 %
Eosinophils Absolute: 110 cells/uL (ref 15–500)
Eosinophils Relative: 2.2 %
HCT: 40.5 % (ref 35.0–45.0)
Hemoglobin: 13.9 g/dL (ref 11.7–15.5)
Lymphs Abs: 2350 cells/uL (ref 850–3900)
MCH: 31.2 pg (ref 27.0–33.0)
MCHC: 34.3 g/dL (ref 32.0–36.0)
MCV: 91 fL (ref 80.0–100.0)
MPV: 9.7 fL (ref 7.5–12.5)
Monocytes Relative: 5.2 %
Neutro Abs: 2260 cells/uL (ref 1500–7800)
Neutrophils Relative %: 45.2 %
Platelets: 219 10*3/uL (ref 140–400)
RBC: 4.45 10*6/uL (ref 3.80–5.10)
RDW: 12 % (ref 11.0–15.0)
Total Lymphocyte: 47 %
WBC: 5 10*3/uL (ref 3.8–10.8)

## 2022-11-02 MED ORDER — ZEPBOUND 2.5 MG/0.5ML ~~LOC~~ SOAJ: 2.5000 mg | SUBCUTANEOUS | 0 refills | Status: DC

## 2022-11-02 NOTE — Telephone Encounter (Signed)
PA approved.   [id number]:[id number];Review Type:Prior Auth;Coverage Start Date:10/03/2022;Coverage End Date:06/30/2023; Authorization Expiration Date: 06/29/2023

## 2022-11-02 NOTE — Telephone Encounter (Signed)
PA initiated via Covermymeds; KEY: [id number]. Awaiting determination.

## 2022-11-02 NOTE — Progress Notes (Signed)
Subjective:    Patient ID: Leslie Rice, female    DOB: June 30, 1965, 57 y.o.   MRN: [id number]  DOS:  11/02/2022 Type of visit - description: CPX Here for CPX. In general feeling well. Denies chest pain or difficulty breathing. No GI or GU symptoms. She is concerned about her waistline request treatment.   Review of Systems See above   Past Medical History:  Diagnosis Date   *Depression 07/21/2008   Used to see a Veterinary surgeon. Meds RF by previous PCP     B12 deficiency 07/21/2008   Qualifier: Diagnosis of  By: Koleen Distance CMA Duncan Dull), Hulan Saas     Chronic pain syndrome    thoracic back pain, seeing Dr. Ethelene Hal   DEPRESSION    DVT (deep venous thrombosis) (HCC)    Edema 07/21/2008   Qualifier: Diagnosis of  By: Koleen Distance CMA (AAMA), Hulan Saas     FATIGUE 07/21/2008   Qualifier: Diagnosis of  By: Koleen Distance CMA (AAMA), Leisha     GERD    Hyperlipidemia 07/21/2008   Intolerant to simvastatin and Pravachol    Hypersomnia with sleep apnea 10/20/2015   Insomnia 07/21/2008   Qualifier: Diagnosis of  By: Koleen Distance CMA (AAMA), Hulan Saas     Irritable bowel syndrome    Migraine headache 07/21/2008   Menstrual related, used to take HRT, now unable to due to clots Used to see Dr Sandria Manly      Morbid obesity due to excess calories (HCC) 10/20/2015   OSA (obstructive sleep apnea)    not on Cpap @ present 11-2013   Primary hypercoagulable state (HCC)    PULMONARY EMBOLISM, HX OF 2002 07/21/2008   Qualifier: Diagnosis of  By: Koleen Distance CMA (AAMA), Leisha     Seasonal allergies 07/26/2015   Sleep related headaches 10/20/2015   Vitamin D deficiency 07/21/2008   Qualifier: Diagnosis of  By: Koleen Distance CMA Duncan Dull), Hulan Saas      Past Surgical History:  Procedure Laterality Date   c section     x 2    CARPAL TUNNEL RELEASE     B   HYSTEROSCOPY W/ ENDOMETRIAL ABLATION  01/16/2019   NASAL SINUS SURGERY      Current Outpatient Medications  Medication Instructions   Armodafinil 250 mg, Oral, Daily   cholecalciferol (VITAMIN D3)  2,000 Units, Oral, Daily   HYDROcodone-acetaminophen (NORCO) 10-325 MG per tablet 1 tablet, Oral, Every 6 hours PRN   lurasidone (LATUDA) 40 mg, Oral, Daily with breakfast   promethazine (PHENERGAN) 25 mg, Oral, Every 6 hours PRN   rivaroxaban (XARELTO) 20 mg, Oral, Daily with supper   SF 5000 PLUS 1.1 % CREA dental cream No dose, route, or frequency recorded.   SUMAtriptan (IMITREX) 100 MG tablet One tablet by mouth at start of migraine.  May repeat in 2 hours if no improvement x 1 dose.   traZODone (DESYREL) 50 MG tablet Take one to two tablets at bedtime.   venlafaxine XR (EFFEXOR-XR) 75 MG 24 hr capsule TAKE 1 CAPSULE DAILY WITH BREAKFAST       Objective:   Physical Exam BP 126/86   Pulse 76   Temp 98.1 F (36.7 C) (Oral)   Resp 18   Ht 5\' 3"  (1.6 m)   Wt 227 lb 2 oz (103 kg)   SpO2 96%   BMI 40.23 kg/m  General: Well developed, NAD, BMI noted Neck: No  thyromegaly  HEENT:  Normocephalic . Face symmetric, atraumatic Lungs:  CTA B Normal respiratory effort, no intercostal retractions, no  accessory muscle use. Heart: RRR,  no murmur.  Abdomen:  Not distended, soft, non-tender. No rebound or rigidity.   Lower extremities: no pretibial edema bilaterally  Skin: Exposed areas without rash. Not pale. Not jaundice Neurologic:  alert & oriented X3.  Speech normal, gait appropriate for age and unassisted Strength symmetric and appropriate for age.  Psych: Cognition and judgment appear intact.  Cooperative with normal attention span and concentration.  Behavior appropriate. No anxious or depressed appearing.     Assessment     Assessment HTN, mild. Primary hypercoagulable state, change Coumadin to Xarelto 05-2014 -PE 2002 while on HRT, saw Dr Cyndie Chime ;+ FH, testing on her was (-);  rx coumadin since 2002 for life  -superficial phlebitis 11-2014 - PE DVT 03/2018 (d/t non compliant w/ xarelto) Hyperlipidemia: Pravachol, simvastatin caused aches  Morbidly  obese Psych: ---Depression, anxiety,  insomnia: since divorce 2007  --- neuro Rx amodafinil  ---Polysubstance abuse, admitted 08/12/2020 Migraine headaches: sees neuro as of 12/2018 .on imitrex per pcp.  Used to take BB, Topamax IBS  OSA:  + test before , repeat sleep study + 2017    Saw neurology 01-2020, they rx HST done 03/2020: Insignificant OSA.  On Armodafinil Home sleep study November 2022: Negative Vit B12 and Vit D def Chronic pain syndrome: hydrocodone Rx per Dr. Ethelene Hal Chest pain: Saw cards 12/2018, echo: LVH, nuclear stress test negative. Endometrial ablation 01/2019  PLAN  Td: 2015 - s/p Shingrix  - vaccines I rec: covid booster , flu shot  (fall) -[facility]  +FH Sister age 48   dx w/ colon cancer  . Mother and aunt had colon ca age dx in their 2s Pt had a cscope at age 53, normal @ [facility] S/p  cscope #2 on  03-2015, normal per report, was rec f/u 5 years.plans to contact GI  -female care: due for a MMG and see gyn, encouraged to proceed  -Labs:  CMP, FLP, CBC, TSH, vitamin D, B12 -Diet and exercise discussed -POA discussed.  Hypercoagulable state: Reports good compliance with Xarelto Hyperlipidemia: Intolerant to Pravachol, simvastatin.  Based on last FLP I recommended rosuvastatin and Zetia but she did not pursue. Will check FLP, consider rosuvastatin 5 mg either daily or 3 times a week plus Zetia.  Morbid obesity: Request injectables, she qualifies, extensive discussion about diet, recommend to exercise once she lose some weight, we talk about the potential side effects as well as cost.  Zepbound  sent.  Will call in 3 weeks and let me know how she is doing to see if we can increase the dose. Migraines: Not a major issue at this point, RF Imitrex as needed. All other problems managed elsewhere. RTC 3 months.  All her problems HTN: On no medicines.  BP today is very good. Hyperlipidemia: Previously Pravachol and simvastatin cause aches.  Last LDL 195.  We are  rechecking today, she started to eat healthier 3 weeks ago and plans to continue doing so.  She will be reluctant to retry statins.  Zetia?Marland Kitchen Morbid obesity: She could qualify for medications but GLP-1 agonist are in short supply and   hard to obtain.  Consider the wellness clinic. Depression anxiety: She is  doing "just okay", no suicidal ideas, follow-up elsewhere. Fatigue: Reports ongoing fatigue.  No obvious etiology, likely multifactorial including depression, medications, deconditioning.  Sleep studies have been negative and vitamin levels normal.  No anemia.Observation. DJD, chronic pain syndrome: Follow-up elsewhere.  She also has fatigue, fibromyalgia? Preventive care:  Recommend to call GI for colonoscopy Mammogram ordered. COVID booster discussed RTC 6 months

## 2022-11-02 NOTE — Patient Instructions (Addendum)
Start  ZEPBOUND  1 injection weekly.  If you need instructions on how to do with please set up a nurse visit. Call in 3 weeks and let me know how that is working for you.  If you are doing okay we can bump up the dose. You may develop constipation, okay to take OTC medications for it. Drink plenty of fluids.  Call your gastroenterologist ASAP to schedule a colonoscopy  Call your gynecologist, you are due to be seen.  Set up your mammogram   Vaccines I recommend: Covid booster Flu shot this fall     GO TO THE LAB : Get the blood work     GO TO THE FRONT DESK, PLEASE SCHEDULE YOUR APPOINTMENTS Come back for   checkup in 3 months

## 2022-11-04 ENCOUNTER — Encounter: Payer: Self-pay | Admitting: Internal Medicine

## 2022-11-04 NOTE — Assessment & Plan Note (Signed)
Here for CPX Hypercoagulable state: Reports good compliance with Xarelto Hyperlipidemia: Intolerant to Pravachol, simvastatin.  Based on last FLP I recommended rosuvastatin and Zetia but she did not pursue. Will check FLP, consider rosuvastatin 5 mg either daily or 3 times a week and perhaps Zetia.   Morbid obesity: Request injectables, she qualifies, extensive discussion about diet, recommend to exercise once she lose some weight, we talk about the potential side effects as well as cost.  Zepbound  sent.  Will call in 3 weeks and let me know how she is doing to see if we can increase the dose. Migraines: Not a major issue at this point, RF Imitrex as needed. All other problems managed elsewhere. RTC 3 months.

## 2022-11-04 NOTE — Assessment & Plan Note (Signed)
Here for CPX Td: 2015 - s/p Shingrix  - vaccines I rec: covid booster , flu shot  (fall) -[facility]  +FH Sister age 55   dx w/ colon cancer  . Mother and aunt had colon ca age dx in their 71s Pt had a cscope at age 64, normal @ [facility] S/p  cscope #2 on  03-2015, normal per report, was rec f/u 5 years.plans to contact GI  -female care: due for a MMG and see gyn, encouraged to proceed  -Labs:  CMP, FLP, CBC, TSH, vitamin D, B12 -Diet and exercise discussed -POA discussed.

## 2022-11-06 ENCOUNTER — Encounter (HOSPITAL_BASED_OUTPATIENT_CLINIC_OR_DEPARTMENT_OTHER): Payer: Self-pay

## 2022-11-06 ENCOUNTER — Ambulatory Visit (HOSPITAL_BASED_OUTPATIENT_CLINIC_OR_DEPARTMENT_OTHER)
Admission: RE | Admit: 2022-11-06 | Discharge: 2022-11-06 | Disposition: A | Discharge status: Home / Self Care | Payer: 59 | Source: Ambulatory Visit | Attending: Internal Medicine | Admitting: Internal Medicine

## 2022-11-06 DIAGNOSIS — Z1231 Encounter for screening mammogram for malignant neoplasm of breast: Secondary | ICD-10-CM | POA: Diagnosis present

## 2022-11-07 MED ORDER — ROSUVASTATIN CALCIUM 5 MG PO TABS: 5.0000 mg | ORAL_TABLET | ORAL | 3 refills | Status: DC

## 2022-11-07 NOTE — Addendum Note (Signed)
Addended byConrad Farmington D on: 11/07/2022 08:04 AM   Modules accepted: Orders

## 2022-11-28 ENCOUNTER — Telehealth: Payer: Self-pay | Admitting: Internal Medicine

## 2022-11-28 NOTE — Telephone Encounter (Signed)
Pt would like a call back to discuss the medication Dr. Drue Novel recently put her on. She prefers Kaylyn call her back instead of relaying message to me

## 2022-11-29 MED ORDER — ZEPBOUND 5 MG/0.5ML ~~LOC~~ SOAJ: 5.0000 mg | SUBCUTANEOUS | 0 refills | Status: DC

## 2022-11-29 MED ORDER — RIVAROXABAN 20 MG PO TABS: 20.0000 mg | ORAL_TABLET | Freq: Every day | ORAL | 3 refills | Status: DC

## 2022-11-29 NOTE — Addendum Note (Signed)
Addended byConrad Bernard D on: 11/29/2022 08:39 AM   Modules accepted: Orders

## 2022-11-29 NOTE — Telephone Encounter (Signed)
Spoke w/ Pt- she wanted to report that she is doing well on Zepbound 2.5mg - denies having nausea, constipation. Rx for Zepbound 5mg  sent to [facility]. She also needs refill for Xarelto- rx sent.

## 2022-12-25 ENCOUNTER — Telehealth: Payer: Self-pay | Admitting: Internal Medicine

## 2022-12-25 MED ORDER — PROMETHAZINE HCL 25 MG PO TABS: 25.0000 mg | ORAL_TABLET | Freq: Four times a day (QID) | ORAL | 1 refills | Status: DC | PRN

## 2022-12-25 MED ORDER — ZEPBOUND 5 MG/0.5ML ~~LOC~~ SOAJ: 5.0000 mg | SUBCUTANEOUS | 0 refills | Status: DC

## 2022-12-25 NOTE — Telephone Encounter (Signed)
Patient called and would like a call back. She states Dr. Drue Novel told her to report her finding with the new medication Zepbound before next visit. Please call

## 2022-12-25 NOTE — Telephone Encounter (Signed)
Spoke w/ Pt- she informed that she has been having some nausea and headaches after weekly zepbound injections on 5mg  dose, she would like to stay on 5mg  for another month. Rx sent. She also requested refill on the promethazine that she uses prn for migraines, rx sent.

## 2022-12-28 ENCOUNTER — Telehealth: Payer: Self-pay | Admitting: Adult Health

## 2022-12-28 DIAGNOSIS — F411 Generalized anxiety disorder: Secondary | ICD-10-CM

## 2022-12-28 DIAGNOSIS — F3181 Bipolar II disorder: Secondary | ICD-10-CM

## 2022-12-28 DIAGNOSIS — F331 Major depressive disorder, recurrent, moderate: Secondary | ICD-10-CM

## 2022-12-28 NOTE — Telephone Encounter (Signed)
Pt called and she is changing pharmcies.She wants 30 day supplies of her latuda 40 mg and effexor xr 75 mg.Pharmacy is [facility] on [address] in [place]

## 2022-12-30 MED ORDER — LURASIDONE HCL 40 MG PO TABS: 40.0000 mg | ORAL_TABLET | Freq: Every day | ORAL | 0 refills | Status: DC

## 2022-12-30 MED ORDER — VENLAFAXINE HCL ER 75 MG PO CP24
75.0000 mg | ORAL_CAPSULE | Freq: Every day | ORAL | 0 refills | Status: DC
Start: 2022-12-30 — End: 2023-01-27

## 2022-12-30 NOTE — Telephone Encounter (Signed)
Sent!

## 2023-01-17 NOTE — Progress Notes (Deleted)
PATIENT: Leslie Rice DOB: 10-Dec-1965  REASON FOR VISIT: follow up HISTORY FROM: patient  Virtual Visit via Telephone Note  I connected with Leslie Rice on 01/17/23 at  8:45 AM EDT by telephone and verified that I am speaking with the correct person using two identifiers.   I discussed the limitations, risks, security and privacy concerns of performing an evaluation and management service by telephone and the availability of in person appointments. I also discussed with the patient that there may be a patient responsible charge related to this service. The patient expressed understanding and agreed to proceed.   History of Present Illness:  01/17/23 ALL (Mychart):  Switched to modafinil and increased sumatriptan to 100mg  PRN.   08/06/2022 ALL: Leslie Rice returns for follow up for hypersomnia. She continues armodafinil 250mg  QD. Last filled 06/25/2022. She knows that it helps but does not feel it is as effective as it was. She continues to have daytime sleepiness during work day. She is sleeping well. She has had two migraines over the past few months. Sumatriptan usually helps some but not completely.   07/04/2021 ALL (Mychart):  Leslie Rice is a 57 y.o. female here today for follow up for hypersomnia. She was seen 01/2021 in consult with Dr Vickey Huger for fatigue and snoring. HST 02/15/2021 did not show any concerns of sleep breathing disorder. ENT referral offered for snoring but declined. She continues armodafinil 250mg  daily. She is doing well and tolerating stimulant. She does not always take on the weekends. She is feeling well today.   History (copied from Dr Dohmeier's previous note)  HPI:  Leslie Rice is a 57 y.o. female patient who was here scheduled for 7:30 AM visit.  I am very sorry.  And on 1 8.  She is here for a yearly migraine follow-up and is also needing refills on armodafinil.  She no longer uses Ajovy but feels that her migraines have been reduced to at mild most 2  events per month which she cannot control the sumatriptan alone.  The key is to get the migraine in the early onset stage.  BMI is at this time 36.4, blood pressure is only mildly elevated and we had performed in 2021 home sleep test to confirm the presence of obstructive sleep apnea which returned with such a mild AHI that I was concerned no CPAP would be necessary.  Leslie Rice daughter however has reported that she feels her mother is presenting with very apneic breathing that she has witnessed.  There was a question of the home sleep test may have not picked up a typical night or may have not picked up apnea that is truly there. I will repeat a home sleep test, but her insurance has changed from [place] to [place] [employer].  I would like to add that the patient had never felt great benefit from using CPAP for the treatment of previously mild apnea obstructive and that now was over a year of not using CPAP she has still benefit in terms of her migraine not having returned.  Sleep is not always restorative but this is also related to stress at work.   Observations/Objective:  Generalized: Well developed, in no acute distress  Mentation: Alert oriented to time, place, history taking. Follows all commands speech and language fluent   Assessment and Plan:  57 y.o. year old female  has a past medical history of *Depression (07/21/2008), B12 deficiency (07/21/2008), Chronic pain syndrome, DEPRESSION, DVT (deep venous thrombosis) (HCC),  Edema (07/21/2008), FATIGUE (07/21/2008), GERD, Hyperlipidemia (07/21/2008), Hypersomnia with sleep apnea (10/20/2015), Insomnia (07/21/2008), Irritable bowel syndrome, Migraine headache (07/21/2008), Morbid obesity due to excess calories (HCC) (10/20/2015), OSA (obstructive sleep apnea), Primary hypercoagulable state (HCC), PULMONARY EMBOLISM, HX OF 2002 (07/21/2008), Seasonal allergies (07/26/2015), Sleep related headaches (10/20/2015), and Vitamin D deficiency (07/21/2008). here  with  No diagnosis found.   Leslie Rice is doing well, today, however does not feel armodafinil is as effective as it was in the past. I will switch her to modafinil 200mg  daily. I will increase sumatriptan to 100mg  as needed. Appropriate dosing discussed. She will monitor for any worsening depression. Healthy lifestyle habits encouraged. She will follow up with me in 1 year, sooner if needed.   No orders of the defined types were placed in this encounter.   No orders of the defined types were placed in this encounter.    Follow Up Instructions:  I discussed the assessment and treatment plan with the patient. The patient was provided an opportunity to ask questions and all were answered. The patient agreed with the plan and demonstrated an understanding of the instructions.   The patient was advised to call back or seek an in-person evaluation if the symptoms worsen or if the condition fails to improve as anticipated.  I provided 15 minutes of non-face-to-face time during this encounter. Patient located at their place of residence during Mychart visit. Provider is in the office.    Shawnie Dapper, NP

## 2023-01-22 ENCOUNTER — Other Ambulatory Visit: Payer: Self-pay | Admitting: Internal Medicine

## 2023-01-22 ENCOUNTER — Telehealth: Payer: Self-pay | Admitting: Family Medicine

## 2023-01-22 MED ORDER — ZEPBOUND 7.5 MG/0.5ML ~~LOC~~ SOAJ: 7.5000 mg | SUBCUTANEOUS | 0 refills | Status: DC

## 2023-01-26 ENCOUNTER — Other Ambulatory Visit: Payer: Self-pay | Admitting: Adult Health

## 2023-01-26 DIAGNOSIS — F411 Generalized anxiety disorder: Secondary | ICD-10-CM

## 2023-01-26 DIAGNOSIS — F331 Major depressive disorder, recurrent, moderate: Secondary | ICD-10-CM

## 2023-01-28 ENCOUNTER — Other Ambulatory Visit: Payer: Self-pay | Admitting: Adult Health

## 2023-01-28 DIAGNOSIS — F3181 Bipolar II disorder: Secondary | ICD-10-CM

## 2023-02-19 ENCOUNTER — Other Ambulatory Visit: Payer: Self-pay | Admitting: Internal Medicine

## 2023-02-19 MED ORDER — ZEPBOUND 10 MG/0.5ML ~~LOC~~ SOAJ: 10.0000 mg | SUBCUTANEOUS | 0 refills | Status: DC

## 2023-02-20 ENCOUNTER — Other Ambulatory Visit: Payer: Self-pay | Admitting: Internal Medicine

## 2023-03-21 ENCOUNTER — Encounter: Payer: Self-pay | Admitting: Adult Health

## 2023-03-21 ENCOUNTER — Telehealth (INDEPENDENT_AMBULATORY_CARE_PROVIDER_SITE_OTHER): Payer: 59 | Admitting: Adult Health

## 2023-03-21 DIAGNOSIS — G4700 Insomnia, unspecified: Secondary | ICD-10-CM | POA: Diagnosis not present

## 2023-03-21 DIAGNOSIS — F331 Major depressive disorder, recurrent, moderate: Secondary | ICD-10-CM

## 2023-03-21 DIAGNOSIS — F411 Generalized anxiety disorder: Secondary | ICD-10-CM

## 2023-03-21 DIAGNOSIS — F3181 Bipolar II disorder: Secondary | ICD-10-CM | POA: Diagnosis not present

## 2023-03-21 MED ORDER — VENLAFAXINE HCL ER 75 MG PO CP24: 75.0000 mg | ORAL_CAPSULE | Freq: Every day | ORAL | 5 refills | Status: DC

## 2023-03-21 MED ORDER — HYDROXYZINE HCL 50 MG PO TABS: 50.0000 mg | ORAL_TABLET | Freq: Two times a day (BID) | ORAL | 5 refills | Status: DC

## 2023-03-21 MED ORDER — LURASIDONE HCL 20 MG PO TABS: 20.0000 mg | ORAL_TABLET | Freq: Every day | ORAL | 5 refills | Status: DC

## 2023-03-21 NOTE — Progress Notes (Signed)
Leslie Rice [id number] [birth year] 57 y.o.  Virtual Visit via Video Note  I connected with pt @ on 03/21/23 at 10:00 AM EST by a video enabled telemedicine application and verified that I am speaking with the correct person using two identifiers.   I discussed the limitations of evaluation and management by telemedicine and the availability of in person appointments. The patient expressed understanding and agreed to proceed.  I discussed the assessment and treatment plan with the patient. The patient was provided an opportunity to ask questions and all were answered. The patient agreed with the plan and demonstrated an understanding of the instructions.   The patient was advised to call back or seek an in-person evaluation if the symptoms worsen or if the condition fails to improve as anticipated.  I provided 15 minutes of non-face-to-face time during this encounter.  The patient was located at home.  The provider was located at [facility].   Leslie Gibbs, NP   Subjective:   Patient ID:  Leslie Rice is a 57 y.o. (DOB 1965/10/21) female.  Chief Complaint: No chief complaint on file.   HPI Leslie Rice presents for follow-up of GAD, insomnia. BPD2.   Describes mood today as "ok". Pleasant. Denies tearfulness. Mood symptoms - denies anxiety and irritability. Reports she experiences depression - "5 out of 10". Denies panic attacks. Reports over thinking - "always". Denies worry and rumination. Mood is stable. Reports discontinuing her Leslie Rice recently and feels like mood has improved. Stating "I feel like I'm doing so much better". Feels like medications are helpful. Varying interest and motivation. Taking medications as prescribed.  Energy levels improved. Active, does not have a regular exercise routine.  Enjoys some usual interests and activities. Lives with partner. Has 2 grown daughters. Appetite adequate. Weight loss - 25 pounds - working on weight loss - Zep  bound Reports sleeping better some nights than others. Averages 8 hours of broken sleep. Focus and concentration improved over past few days. Completing tasks. Managing aspects of household. 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  Denies SI or HI. Denies AH or VH. Denies self harm. Denies substance use.  Previously seen by: Leslie Rice   Previous medications: Zoloft, Wellbutrin, Lexapro, Effexor  Review of Systems:  Review of Systems  Musculoskeletal:  Negative for gait problem.  Neurological:  Negative for tremors.  Psychiatric/Behavioral:         Please refer to HPI    Medications: I have reviewed the patient's current medications.  Current Outpatient Medications  Medication Sig Dispense Refill   hydrOXYzine (ATARAX) 50 MG tablet Take 1 tablet (50 mg total) by mouth 2 (two) times daily. 60 tablet 5   Armodafinil 250 MG tablet Take 1 tablet (250 mg total) by mouth daily. 30 tablet 5   cholecalciferol (VITAMIN D3) 25 MCG (1000 UNIT) tablet Take 2,000 Units by mouth daily.     HYDROcodone-acetaminophen (NORCO) 10-325 MG per tablet Take 1 tablet by mouth every 6 (six) hours as needed for moderate pain.      lurasidone (LATUDA) 20 MG TABS tablet Take 1 tablet (20 mg total) by mouth daily with breakfast. 30 tablet 5   promethazine (PHENERGAN) 25 MG tablet Take 1 tablet (25 mg total) by mouth every 6 (six) hours as needed for nausea or vomiting. 30 tablet 1   rivaroxaban (XARELTO) 20 MG TABS tablet Take 1 tablet (20 mg total) by mouth daily with supper. 90 tablet 3   rosuvastatin (  CRESTOR) 5 MG tablet TAKE 1 TABLET BY MOUTH ON MONDAY, WEDNESDAY AND FRIDAY 12 tablet 0   SF 5000 PLUS 1.1 % CREA dental cream      SUMAtriptan (IMITREX) 100 MG tablet One tablet by mouth at start of migraine.  May repeat in 2 hours if no improvement x 1 dose. 10 tablet 1   tirzepatide (ZEPBOUND) 10 MG/0.5ML Pen Inject 10 mg into the skin once a week. 2 mL 0   traZODone (DESYREL) 50 MG tablet Take one to  two tablets at bedtime. 60 tablet 5   venlafaxine XR (EFFEXOR-XR) 75 MG 24 hr capsule Take 1 capsule (75 mg total) by mouth daily with breakfast. 30 capsule 5   No current facility-administered medications for this visit.    Medication Side Effects: None  Allergies:  Allergies  Allergen Reactions   Ciprofloxacin     REACTION: vomiting   Codeine     REACTION: vomiting   Sulfonamide Derivatives     hives    Past Medical History:  Diagnosis Date   *Depression 07/21/2008   Used to see a Veterinary surgeon. Meds RF by previous PCP     B12 deficiency 07/21/2008   Qualifier: Diagnosis of  By: Leslie Rice CMA Leslie Rice), Leslie Rice     Chronic pain syndrome    thoracic back pain, seeing Leslie Rice   DEPRESSION    DVT (deep venous thrombosis) (HCC)    Edema 07/21/2008   Qualifier: Diagnosis of  By: Leslie Rice CMA (AAMA), Leslie Rice     FATIGUE 07/21/2008   Qualifier: Diagnosis of  By: Leslie Rice CMA (AAMA), Leslie Rice     GERD    Hyperlipidemia 07/21/2008   Intolerant to simvastatin and Pravachol    Hypersomnia with sleep apnea 10/20/2015   Insomnia 07/21/2008   Qualifier: Diagnosis of  By: Leslie Rice CMA (AAMA), Leslie Rice     Irritable bowel syndrome    Migraine headache 07/21/2008   Menstrual related, used to take HRT, now unable to due to clots Used to see Leslie Rice      Morbid obesity due to excess calories (HCC) 10/20/2015   OSA (obstructive sleep apnea)    not on Cpap @ present 11-2013   Primary hypercoagulable state (HCC)    PULMONARY EMBOLISM, HX OF 2002 07/21/2008   Qualifier: Diagnosis of  By: Leslie Rice CMA (AAMA), Leslie Rice     Seasonal allergies 07/26/2015   Sleep related headaches 10/20/2015   Vitamin D deficiency 07/21/2008   Qualifier: Diagnosis of  By: Leslie Rice CMA Leslie Rice), Leslie Rice      Family History  Problem Relation Age of Onset   Colon cancer Mother    CAD Father        age? 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   No daily use of caffeine.          Social Drivers of Corporate investment banker Strain: Not on file  Food Insecurity: Not on file  Transportation Needs: Not on file  Physical Activity: Not on file  Stress: Not on file  Social Connections: Not on file  Intimate Partner Violence: Not on file    Past Medical History, Surgical history, Social history, and Family history were reviewed and updated as appropriate.   Please see review of systems for further details on the patient's review from today.   Objective:   Physical Exam:  There were no vitals taken for this visit.  Physical Exam Constitutional:      General: She is not in acute distress. Musculoskeletal:        General: No deformity.  Neurological:     Mental Status: She is alert and oriented to person, place, and time.     Coordination: Coordination normal.  Psychiatric:        Attention and Perception: Attention and perception normal. She does not perceive auditory or visual hallucinations.        Mood and Affect: Affect is not labile, blunt, angry or inappropriate.        Speech: Speech normal.        Behavior: Behavior normal.        Thought Content: Thought content normal. Thought content is not paranoid or delusional. Thought content  does not include homicidal or suicidal ideation. Thought content does not include homicidal or suicidal plan.        Cognition and Memory: Cognition and memory normal.        Judgment: Judgment normal.     Comments: Insight intact     Lab Review:     Component Value Date/Time   NA 140 11/02/2022 1349   K 4.1 11/02/2022 1349   CL 105 11/02/2022 1349   CO2 23 11/02/2022 1349   GLUCOSE 79 11/02/2022 1349   BUN 12 11/02/2022 1349   CREATININE 0.82 11/02/2022 1349   CALCIUM 9.2 11/02/2022 1349   PROT 7.0 11/02/2022 1349   ALBUMIN 4.1 12/25/2021 1145   AST 20 11/02/2022 1349   ALT 22 11/02/2022 1349   ALKPHOS 90 12/25/2021 1145   BILITOT 0.5 11/02/2022 1349   GFRNONAA >60 03/18/2018 0347   GFRAA >60 03/18/2018 0347       Component Value Date/Time   WBC 5.0 11/02/2022 1349   RBC 4.45 11/02/2022 1349   HGB 13.9 11/02/2022 1349   HCT 40.5 11/02/2022 1349   PLT 219 11/02/2022 1349   MCV 91.0 11/02/2022 1349   MCH 31.2 11/02/2022 1349   MCHC 34.3 11/02/2022 1349   RDW 12.0 11/02/2022 1349   LYMPHSABS 2,350 11/02/2022 1349   MONOABS 0.3 12/25/2021 1145   EOSABS 110 11/02/2022 1349   BASOSABS 20 11/02/2022 1349    No results found for: "POCLITH", "LITHIUM"   No results found for: "PHENYTOIN", "PHENOBARB", "VALPROATE", "CBMZ"   .res Assessment: Plan:    Plan:  D/C Latuda - patient has recently discontinued - making her lethargic. Discussed mood implications with abrupt discontinuation. Will  send in a 20mg  tablet for now for patient to start if mood declines.   D/C Trazadone 50mg  at hs for sleep - headaches.  Add Hydroxzine 50mg  - take one to two at bedtime - sleep  Effexor XR 75mg  daily   Armodafonil 250mg  daily - sleep apnea. Neurologist will resume at next appointment in November - Nuvigil or Provigil.  RTC 6 months  Patient advised to contact office with any questions, adverse effects, or acute worsening in signs and symptoms.  Discussed potential benefits,  risk, and side effects of benzodiazepines to include potential risk of tolerance and dependence, as well as possible drowsiness.  Advised patient not to drive if experiencing drowsiness and to take lowest possible effective dose to minimize risk of dependence and tolerance.  Discussed potential metabolic side effects associated with atypical antipsychotics, as well as potential risk for movement side effects. Advised pt to contact office if movement side effects occur.  Time spent with patient was 25 minutes. Greater than 50% of face to face time with patient was spent on counseling and coordination of care.    Diagnoses and all orders for this visit:  Bipolar II disorder (HCC) -     lurasidone (LATUDA) 20 MG TABS tablet; Take 1 tablet (20 mg total) by mouth daily with breakfast.  Generalized anxiety disorder -     venlafaxine XR (EFFEXOR-XR) 75 MG 24 hr capsule; Take 1 capsule (75 mg total) by mouth daily with breakfast.  Insomnia, unspecified type -     hydrOXYzine (ATARAX) 50 MG tablet; Take 1 tablet (50 mg total) by mouth 2 (two) times daily.  Major depressive disorder, recurrent episode, moderate (HCC) -     venlafaxine XR (EFFEXOR-XR) 75 MG 24 hr capsule; Take 1 capsule (75 mg total) by mouth daily with breakfast.     Please see After Visit Summary for patient specific instructions.  Future Appointments  Date Time Provider Department Center  05/13/2023  9:45 AM Lomax, Amy, NP [facility] None    No orders of the defined types were placed in this encounter.     -------------------------------

## 2023-03-22 ENCOUNTER — Other Ambulatory Visit: Payer: Self-pay | Admitting: Internal Medicine

## 2023-03-22 MED ORDER — ZEPBOUND 12.5 MG/0.5ML ~~LOC~~ SOAJ: 12.5000 mg | SUBCUTANEOUS | 0 refills | Status: DC

## 2023-03-23 ENCOUNTER — Other Ambulatory Visit: Payer: Self-pay | Admitting: Internal Medicine

## 2023-04-12 ENCOUNTER — Ambulatory Visit: Payer: Self-pay | Admitting: Internal Medicine

## 2023-04-12 NOTE — Telephone Encounter (Signed)
 If severe dizziness, needs to be evaluated at the ER , she is anticoagulated. Zepbound does not cause dizziness, it would be uncommon. Okay to proceed from my standpoint.

## 2023-04-12 NOTE — Telephone Encounter (Signed)
**Note De-identified  [place] Obfuscation** Please advise 

## 2023-04-12 NOTE — Telephone Encounter (Signed)
 Copied from CRM [id number]. Topic: Clinical - Red Word Triage >> Apr 12, 2023  2:07 PM Mercedes MATSU wrote: Red Word that prompted transfer to Nurse Triage: Patient has been very dizzy for the past week, is unsure if its a side effect of the zepound injection. Patient is worried.   Chief Complaint: Dizziness/Medication Question Symptoms: Dizziness Frequency: x 1.5 weeks since medication dose was increased Pertinent Negatives: Patient denies ear infections, sinus  Disposition: [] ED /[] Urgent Care (no appt availability in office) / [] Appointment(In office/virtual)/ []  [facility]/ [x] Home Care/ [] Refused Recommended Disposition /[] [facility]/ [x]  Follow-up with PCP Additional Notes: Patient called and advised that she has had some Patient states that she has had dizziness for about a week and a half after having her Zepbound  dose increased.  Patient states that she is supposed to take another dose tomorrow but wants medical advice first on this from her PCP.  She denies any symptoms at all other than dizziness especially when standing up too fast. She said she does not have ear infections or any congestion or colds and she is sure this must be what has caused this because it is the only thing that is different. She states that this medication change is the only difference and would like advice from her PCP on what to do about her dosage that she is supposed to take tomorrow.  Patient is advised if anything gets worse to go to the emergency room for further evaluation.    Reason for Disposition  [1] Caller has NON-URGENT medicine question about med that PCP prescribed AND [2] triager unable to answer question  Answer Assessment - Initial Assessment Questions 1. NAME of MEDICINE: What medicine(s) are you calling about?     Zepbound  2. QUESTION: What is your question? (e.g., double dose of medicine, side effect)     Patient states that she has had dizziness for about a week and a  half after having her Zepbound  dose increased.  Patient states that she is supposed to take another dose tomorrow but wants medical advice first on this from her PCP. 3. PRESCRIBER: Who prescribed the medicine? Reason: if prescribed by specialist, call should be referred to that group.     Dr Aloysius Paz---patient's PCP 4. SYMPTOMS: Do you have any symptoms? If Yes, ask: What symptoms are you having?  How bad are the symptoms (e.g., mild, moderate, severe)     Dizziness after medication dosage was increased. It takes patient a minute to get stabilized and the only change is this medication dose was an increase from last time. 5. PREGNANCY:  Is there any chance that you are pregnant? When was your last menstrual period?     No  Protocols used: Medication Question Call-A-AH

## 2023-04-12 NOTE — Telephone Encounter (Signed)
 Spoke w/ Pt- informed of PCP recommendations, denies severe dizziness. Appt scheduled for Monday.

## 2023-04-15 ENCOUNTER — Ambulatory Visit: Payer: 59 | Admitting: Internal Medicine

## 2023-04-15 ENCOUNTER — Encounter: Payer: Self-pay | Admitting: Internal Medicine

## 2023-04-15 VITALS — BP 126/78 | HR 69 | Temp 97.6°F | Resp 16 | Ht 63.0 in | Wt 199.4 lb

## 2023-04-15 DIAGNOSIS — R399 Unspecified symptoms and signs involving the genitourinary system: Secondary | ICD-10-CM | POA: Diagnosis not present

## 2023-04-15 DIAGNOSIS — I10 Essential (primary) hypertension: Secondary | ICD-10-CM

## 2023-04-15 DIAGNOSIS — R5383 Other fatigue: Secondary | ICD-10-CM

## 2023-04-15 DIAGNOSIS — R42 Dizziness and giddiness: Secondary | ICD-10-CM

## 2023-04-15 DIAGNOSIS — E785 Hyperlipidemia, unspecified: Secondary | ICD-10-CM | POA: Diagnosis not present

## 2023-04-15 MED ORDER — ZEPBOUND 7.5 MG/0.5ML ~~LOC~~ SOAJ: 7.5000 mg | SUBCUTANEOUS | 0 refills | Status: DC

## 2023-04-15 NOTE — Progress Notes (Signed)
 Subjective:    Patient ID: Leslie Rice, female    DOB: 1965/10/17, 58 y.o.   MRN: [id number]  DOS:  04/15/2023 Type of visit - description: Acute  Chief complaint is dizziness for the last 2 weeks: She feels mildly dizzy when she stands up or turns in bed.  Also if she moves her head fast. Denies any recent URI, no headache.  No fall or head injury. Symptoms increased after she bump up the dose of Zepbound .  Also, has seen traces of blood in the toilet paper after she urinates.  This does not happen daily. No gross hematuria, no fever or chills.  Denies any abdominal pain, stool color is normal, bowel movement normal. Did report nausea daily since she started weight loss medicine 10-2022. In general not feeling well recently with fatigue and lack of energy.  Feeling cold.  Review of Systems See above   Past Medical History:  Diagnosis Date   *Depression 07/21/2008   Used to see a veterinary surgeon. Meds RF by previous PCP     B12 deficiency 07/21/2008   Qualifier: Diagnosis of  By: Genie CMA LEODIS), Chick     Chronic pain syndrome    thoracic back pain, seeing Dr. Bonner   DEPRESSION    DVT (deep venous thrombosis) (HCC)    Edema 07/21/2008   Qualifier: Diagnosis of  By: Genie CMA (AAMA), Chick     FATIGUE 07/21/2008   Qualifier: Diagnosis of  By: Genie CMA (AAMA), Leisha     GERD    Hyperlipidemia 07/21/2008   Intolerant to simvastatin and Pravachol    Hypersomnia with sleep apnea 10/20/2015   Insomnia 07/21/2008   Qualifier: Diagnosis of  By: Genie CMA (AAMA), Chick     Irritable bowel syndrome    Migraine headache 07/21/2008   Menstrual related, used to take HRT, now unable to due to clots Used to see Dr Maurice      Morbid obesity due to excess calories (HCC) 10/20/2015   OSA (obstructive sleep apnea)    not on Cpap @ present 11-2013   Primary hypercoagulable state (HCC)    PULMONARY EMBOLISM, HX OF 2002 07/21/2008   Qualifier: Diagnosis of  By: Genie CMA (AAMA), Leisha      Seasonal allergies 07/26/2015   Sleep related headaches 10/20/2015   Vitamin D  deficiency 07/21/2008   Qualifier: Diagnosis of  By: Genie CMA (AAMA), Chick      Past Surgical History:  Procedure Laterality Date   c section     x 2    CARPAL TUNNEL RELEASE     B   HYSTEROSCOPY W/ ENDOMETRIAL ABLATION  01/16/2019   NASAL SINUS SURGERY      Current Outpatient Medications  Medication Instructions   Armodafinil  250 mg, Oral, Daily   cholecalciferol (VITAMIN D3) 2,000 Units, Daily   HYDROcodone -acetaminophen  (NORCO) 10-325 MG per tablet 1 tablet, Every 6 hours PRN   hydrOXYzine  (ATARAX ) 50 mg, Oral, 2 times daily   promethazine  (PHENERGAN ) 25 mg, Oral, Every 6 hours PRN   rivaroxaban  (XARELTO ) 20 mg, Oral, Daily with supper   rosuvastatin  (CRESTOR ) 5 mg, Oral, 3 times weekly   SF 5000 PLUS 1.1 % CREA dental cream No dose, route, or frequency recorded.   SUMAtriptan  (IMITREX ) 100 MG tablet One tablet by mouth at start of migraine.  May repeat in 2 hours if no improvement x 1 dose.   venlafaxine  XR (EFFEXOR -XR) 75 mg, Oral, Daily with breakfast   Zepbound  7.5 mg,  Subcutaneous, Weekly       Objective:   Physical Exam BP 126/78   Pulse 69   Temp 97.6 F (36.4 C) (Oral)   Resp 16   Ht 5' 3 (1.6 m)   Wt 199 lb 6 oz (90.4 kg)   SpO2 99%   BMI 35.32 kg/m  General:   Well developed, well appearing.  In no distress. HEENT:  Normocephalic . Face symmetric, atraumatic Neck: Normal range of motion Lungs:  CTA B Normal respiratory effort, no intercostal retractions, no accessory muscle use. Heart: RRR,  no murmur.  Abdomen:  Not distended, soft, non-tender. No rebound or rigidity. No CVA tenderness Skin: Not pale. Not jaundice Lower extremities: no pretibial edema bilaterally  Neurologic:  alert & oriented X3.  Speech normal, gait appropriate for age and unassisted EOMI, motor symmetric. Psych--  Cognition and judgment appear intact.  Cooperative with normal attention span  and concentration.  Behavior appropriate. No anxious or depressed appearing.     Assessment   Assessment HTN, mild. Primary hypercoagulable state, change Coumadin  to Xarelto  05-2014 -PE 2002 while on HRT, saw Dr Freddie ;+ FH, testing on her was (-);  rx coumadin  since 2002 for life  -superficial phlebitis 11-2014 - PE DVT 03/2018 (d/t non compliant w/ xarelto ) Hyperlipidemia: Pravachol, simvastatin caused aches  Morbidly obese Psych: ---Depression, anxiety,  insomnia: since divorce 2007  --- neuro Rx amodafinil  ---Polysubstance abuse, admitted 08/12/2020 Migraine headaches: sees neuro as of 12/2018 .on imitrex  per pcp.  Used to take BB, Topamax  IBS  OSA:  + test before , repeat sleep study + 2017    Saw neurology 01-2020, they rx HST done 03/2020: Insignificant OSA.  On Armodafinil  Home sleep study November 2022: Negative Vit B12 and Vit D def Chronic pain syndrome: hydrocodone  Rx per Dr. Bonner Chest pain: Saw cards 12/2018, echo: LVH, nuclear stress test negative. Endometrial ablation 01/2019  PLAN Dizziness:  As described above, likely a peripheral issue.  She is anticoagulated but denies headache or any recent fall or injury. Orthostatic vital signs negative. Recommend conservative treatment with rest and fluids, if not better will need to be quickly reevaluated, might need CT head.  Check a CBC.  See next Morbid obesity: Started Zepbound  10-2022, initial weight 227 pounds, current weight 199 pounds. She is currently taking 12.5 mg weekly, since she bump up the dose developed dizziness, she reports she is drinking plenty of fluids. Additionally, she has been nauseous for the last few months. Plan: Decrease Zepbound  to 7.5 mg weekly.  Reassess in few weeks. Addendum: will add  amylase lipase to labs, she c/o nausea Fatigue, lack of energy, feeling cold: Check TSH. UTI?  Occasional blood when she wipes after urination, check UA urine culture. High cholesterol: Based on last  FLP, started on rosuvastatin  3 times a week, checking labs. Depression anxiety insomnia:  Stopped Latuda  ~ 1 month ago (that could cause some dizziness as well) RTC 6 to 8 weeks

## 2023-04-15 NOTE — Patient Instructions (Addendum)
 Rest and be sure you drink plenty of fluids.  If the dizziness is not gradually better let me know.  If you have severe dizziness or you develop a bad headache: Seek medical attention immediately.  Will decrease Zepbound  to only 7.5 mg weekly.       GO TO THE LAB : Get the blood work     Next visit with me in 6 to 8 weeks    Please schedule it at the front desk

## 2023-04-16 ENCOUNTER — Other Ambulatory Visit: Payer: Self-pay

## 2023-04-16 ENCOUNTER — Ambulatory Visit (INDEPENDENT_AMBULATORY_CARE_PROVIDER_SITE_OTHER): Payer: 59

## 2023-04-16 ENCOUNTER — Other Ambulatory Visit (HOSPITAL_COMMUNITY): Payer: Self-pay

## 2023-04-16 DIAGNOSIS — R110 Nausea: Secondary | ICD-10-CM | POA: Diagnosis not present

## 2023-04-16 LAB — CBC WITH DIFFERENTIAL/PLATELET
Basophils Absolute: 0 10*3/uL (ref 0.0–0.1)
Basophils Relative: 0.6 % (ref 0.0–3.0)
Eosinophils Absolute: 0.1 10*3/uL (ref 0.0–0.7)
Eosinophils Relative: 2.1 % (ref 0.0–5.0)
HCT: 44.5 % (ref 36.0–46.0)
Hemoglobin: 15.3 g/dL — ABNORMAL HIGH (ref 12.0–15.0)
Lymphocytes Relative: 36.4 % (ref 12.0–46.0)
Lymphs Abs: 2.1 10*3/uL (ref 0.7–4.0)
MCHC: 34.4 g/dL (ref 30.0–36.0)
MCV: 91 fL (ref 78.0–100.0)
Monocytes Absolute: 0.4 10*3/uL (ref 0.1–1.0)
Monocytes Relative: 6.1 % (ref 3.0–12.0)
Neutro Abs: 3.2 10*3/uL (ref 1.4–7.7)
Neutrophils Relative %: 54.8 % (ref 43.0–77.0)
Platelets: 226 10*3/uL (ref 150.0–400.0)
RBC: 4.88 Mil/uL (ref 3.87–5.11)
RDW: 12.3 % (ref 11.5–15.5)
WBC: 5.9 10*3/uL (ref 4.0–10.5)

## 2023-04-16 LAB — LIPASE: Lipase: 41 U/L (ref 11.0–59.0)

## 2023-04-16 LAB — URINALYSIS, ROUTINE W REFLEX MICROSCOPIC
Bilirubin Urine: NEGATIVE
Hgb urine dipstick: NEGATIVE
Ketones, ur: NEGATIVE
Nitrite: NEGATIVE
Specific Gravity, Urine: 1.025 (ref 1.000–1.030)
Total Protein, Urine: NEGATIVE
Urine Glucose: NEGATIVE
Urobilinogen, UA: 0.2 (ref 0.0–1.0)
pH: 5.5 (ref 5.0–8.0)

## 2023-04-16 LAB — LIPID PANEL
Cholesterol: 320 mg/dL — ABNORMAL HIGH (ref 0–200)
HDL: 41 mg/dL (ref >39.00)
LDL Cholesterol: 240 mg/dL — ABNORMAL HIGH (ref 0–99)
NonHDL: 278.82
Total CHOL/HDL Ratio: 8
Triglycerides: 196 mg/dL — ABNORMAL HIGH (ref 0.0–149.0)
VLDL: 39.2 mg/dL (ref 0.0–40.0)

## 2023-04-16 LAB — URINE CULTURE
MICRO NUMBER:: 15921676
Result:: NO GROWTH
SPECIMEN QUALITY:: ADEQUATE

## 2023-04-16 LAB — AST: AST: 21 U/L (ref 0–37)

## 2023-04-16 LAB — TSH: TSH: 0.83 u[IU]/mL (ref 0.35–5.50)

## 2023-04-16 LAB — ALT: ALT: 34 U/L (ref 0–35)

## 2023-04-16 LAB — AMYLASE: Amylase: 14 U/L — ABNORMAL LOW (ref 27–131)

## 2023-04-16 NOTE — Assessment & Plan Note (Signed)
 Dizziness:  As described above, likely a peripheral issue.  She is anticoagulated but denies headache or any recent fall or injury. Orthostatic vital signs negative. Recommend conservative treatment with rest and fluids, if not better will need to be quickly reevaluated, might need CT head.  Check a CBC.  See next Morbid obesity: Started Zepbound  10-2022, initial weight 227 pounds, current weight 199 pounds. She is currently taking 12.5 mg weekly, since she bump up the dose developed dizziness, she reports she is drinking plenty of fluids. Additionally, she has been nauseous for the last few months. Plan: Decrease Zepbound  to 7.5 mg weekly.  Reassess in few weeks. Addendum: will add  amylase lipase to labs, she c/o nausea Fatigue, lack of energy, feeling cold: Check TSH. UTI?  Occasional blood when she wipes after urination, check UA urine culture. High cholesterol: Based on last FLP, started on rosuvastatin  3 times a week, checking labs. Depression anxiety insomnia:  Stopped Latuda  ~ 1 month ago (that could cause some dizziness as well) RTC 6 to 8 weeks

## 2023-04-17 ENCOUNTER — Telehealth: Payer: Self-pay | Admitting: Internal Medicine

## 2023-04-17 NOTE — Telephone Encounter (Addendum)
 Call patient: Cholesterol is extremely high. Recommend:  Increase rosuvastatin to 10 mg 3 times a week. Add Zetia 10 mg daily, send 1 year supply Urine culture showed no infection. Follow-up as recommended   on 06/03/23

## 2023-04-18 MED ORDER — EZETIMIBE 10 MG PO TABS: 10.0000 mg | ORAL_TABLET | Freq: Every day | ORAL | 3 refills | Status: DC

## 2023-04-18 MED ORDER — ROSUVASTATIN CALCIUM 10 MG PO TABS: 10.0000 mg | ORAL_TABLET | ORAL | 3 refills | Status: DC

## 2023-04-18 NOTE — Telephone Encounter (Signed)
 Spoke w/ Pt- informed of results and recommendations. She will increase rosuvastatin and start Zetia. She will also keep appt next month w/ you. She wanted to see why her hemoglobin might be high and if anything needs to be done about it.

## 2023-04-18 NOTE — Telephone Encounter (Signed)
Spoke w/ Pt- informed of PCP recommendations. Pt verbalized understanding.  

## 2023-04-18 NOTE — Telephone Encounter (Signed)
 At this point hemoglobin is slightly elevated but not dangerous. Could be related to sleep apnea, it might have come back. We can talk more at the next visit.

## 2023-04-21 ENCOUNTER — Other Ambulatory Visit: Payer: Self-pay | Admitting: Internal Medicine

## 2023-04-22 ENCOUNTER — Telehealth: Payer: Self-pay

## 2023-04-22 MED ORDER — ZEPBOUND 7.5 MG/0.5ML ~~LOC~~ SOAJ: 7.5000 mg | SUBCUTANEOUS | 0 refills | Status: DC

## 2023-04-22 NOTE — Telephone Encounter (Signed)
 PA initiated via Covermymeds; KEY; [id number]    Clinical Override not needed

## 2023-04-22 NOTE — Addendum Note (Signed)
 Addended byConrad [place] D on: 04/22/2023 10:08 AM   Modules accepted: Orders

## 2023-04-30 ENCOUNTER — Telehealth: Payer: Self-pay

## 2023-04-30 ENCOUNTER — Other Ambulatory Visit: Payer: Self-pay

## 2023-04-30 MED ORDER — ARMODAFINIL 250 MG PO TABS: 250.0000 mg | ORAL_TABLET | Freq: Every day | ORAL | 0 refills | Status: DC

## 2023-04-30 NOTE — Telephone Encounter (Signed)
Copied from CRM [id number]. Topic: Clinical - Medication Question >> Apr 30, 2023  1:55 PM Corin V wrote: Reason for CRM: Patient called in regarding her Zepbound Rx. She had to step back down to the 7.5 dose sue to being sick and being off the Rx for a bit. She didn't know if insurance was denying it because there was a step down in dosage and was considering it a failed medication trial or if the prior authorization still needed to be submitted. If insurance denies coverage for the Zepbound due to "failed treatment" she was wanting to try and switch to [facility]. Please verify if prior auth was done, if insurance denied, and update patient on medication plan.

## 2023-05-01 ENCOUNTER — Telehealth: Payer: Self-pay

## 2023-05-01 ENCOUNTER — Other Ambulatory Visit: Payer: Self-pay | Admitting: *Deleted

## 2023-05-01 MED ORDER — WEGOVY 0.25 MG/0.5ML ~~LOC~~ SOAJ: 0.2500 mg | SUBCUTANEOUS | 0 refills | Status: DC

## 2023-05-01 MED ORDER — ARMODAFINIL 250 MG PO TABS: 250.0000 mg | ORAL_TABLET | Freq: Every day | ORAL | 0 refills | Status: DC

## 2023-05-01 NOTE — Telephone Encounter (Signed)
PA approved.   [id number];XBJYNW:[id number];Review Type:Prior Auth;Coverage Start Date:04/10/2023;Coverage End Date:11/27/2023;

## 2023-05-01 NOTE — Telephone Encounter (Signed)
Yes, the rx was printed by [facility] yesterday but not sent in.  Can you send electronically instead?

## 2023-05-01 NOTE — Addendum Note (Signed)
Addended byConrad Elmwood D on: 05/01/2023 03:02 PM   Modules accepted: Orders

## 2023-05-01 NOTE — Telephone Encounter (Signed)
PA initiated via Covermymeds; KEY: [id number]. Awaiting determination.

## 2023-05-01 NOTE — Telephone Encounter (Signed)
Rx sent 

## 2023-05-01 NOTE — Telephone Encounter (Signed)
Okay to try [facility].  Start 0.25 mg weekly x 4, then 0.5 mg weekly x 4. If medication is not covered we could refer her to our clinical pharmacist to clarify

## 2023-05-03 ENCOUNTER — Telehealth: Payer: Self-pay

## 2023-05-03 NOTE — Telephone Encounter (Signed)
Pharmacy Patient Advocate Encounter  Received notification from [employer] that Prior Authorization for Zepbound 7.5MG /0.5ML pen-injectors  has been CANCELLED due to medication has been changed to [facility]   PA #/Case ID/Reference #: [id number]

## 2023-05-09 NOTE — Progress Notes (Signed)
PATIENT: Leslie Rice DOB: 1966/03/27  REASON FOR VISIT: follow up HISTORY FROM: patient  Virtual Visit via Telephone Note  I connected with Vincenza Hews on 05/13/23 at  9:45 AM EST by telephone and verified that I am speaking with the correct person using two identifiers.   I discussed the limitations, risks, security and privacy concerns of performing an evaluation and management service by telephone and the availability of in person appointments. I also discussed with the patient that there may be a patient responsible charge related to this service. The patient expressed understanding and agreed to proceed.   History of Present Illness:  05/13/23 ALL (Mychart): [name] returns for follow up for hypersomnia and headaches. She was last seen 07/2022 and we switched from armodafinil to modafinil and increased sumatriptan from 50 to 100mg  PRN. She did not feel modafinil was effective and we switched back to armodafinil 10/2022.   Since, she reports doing well. She feels armodafinil is helping with EDS. She is tolerating med well without adverse effects. She usually gets about 8 hours of sleep. She reports sometimes sleep is fragmented. She started Zepbound about 7 months and did not feel well on it.  She stayed tired and cold. Now on Wegovy and feeling better. She reports migraines are well managed. Sumatriptan works well if she takes it in time. She may take it 1-2 times a month.   07/17/2022 ALL (Mychart):  [name] returns for follow up for hypersomnia. She continues armodafinil 250mg  QD. Last filled 06/25/2022. She knows that it helps but does not feel it is as effective as it was. She continues to have daytime sleepiness during work day. She is sleeping well. She has had two migraines over the past few months. Sumatriptan usually helps some but not completely.   07/04/2021 ALL (Mychart):  [name] is a 58 y.o. female here today for follow up for hypersomnia. She was seen 01/2021 in  consult with Dr Vickey Huger for fatigue and snoring. HST 02/15/2021 did not show any concerns of sleep breathing disorder. ENT referral offered for snoring but declined. She continues armodafinil 250mg  daily. She is doing well and tolerating stimulant. She does not always take on the weekends. She is feeling well today.   History (copied from Dr Dohmeier's previous note)  HPI:  [name] is a 58 y.o. female patient who was here scheduled for 7:30 AM visit.  I am very sorry.  And on 1 8.  She is here for a yearly migraine follow-up and is also needing refills on armodafinil.  She no longer uses Ajovy but feels that her migraines have been reduced to at mild most 2 events per month which she cannot control the sumatriptan alone.  The key is to get the migraine in the early onset stage.  BMI is at this time 36.4, blood pressure is only mildly elevated and we had performed in 2021 home sleep test to confirm the presence of obstructive sleep apnea which returned with such a mild AHI that I was concerned no CPAP would be necessary.  [name] daughter however has reported that she feels her mother is presenting with very apneic breathing that she has witnessed.  There was a question of the home sleep test may have not picked up a typical night or may have not picked up apnea that is truly there. I will repeat a home sleep test, but her insurance has changed from [place] to [place] healthcare.  I would  like to add that the patient had never felt great benefit from using CPAP for the treatment of previously mild apnea obstructive and that now was over a year of not using CPAP she has still benefit in terms of her migraine not having returned.  Sleep is not always restorative but this is also related to stress at work.   Observations/Objective:  Generalized: Well developed, in no acute distress  Mentation: Alert oriented to time, place, history taking. Follows all commands speech and language  fluent   Assessment and Plan:  58 y.o. year old female  has a past medical history of *Depression (07/21/2008), B12 deficiency (07/21/2008), Chronic pain syndrome, DEPRESSION, DVT (deep venous thrombosis) (HCC), Edema (07/21/2008), FATIGUE (07/21/2008), GERD, Hyperlipidemia (07/21/2008), Hypersomnia with sleep apnea (10/20/2015), Insomnia (07/21/2008), Irritable bowel syndrome, Migraine headache (07/21/2008), Morbid obesity due to excess calories (HCC) (10/20/2015), OSA (obstructive sleep apnea), Primary hypercoagulable state (HCC), PULMONARY EMBOLISM, HX OF 2002 (07/21/2008), Seasonal allergies (07/26/2015), Sleep related headaches (10/20/2015), and Vitamin D deficiency (07/21/2008). here with    ICD-10-CM   1. Hypersomnia  G47.10     2. Nonintractable migraine, unspecified migraine type  G43.009 SUMAtriptan (IMITREX) 100 MG tablet      [name] is doing well, today. We will continue armodafinil 250mg  daily. She will continue sumatriptan 100mg  as needed. Appropriate dosing discussed. She will monitor for any worsening depression. Healthy lifestyle habits encouraged. She will follow up with me in 1 year, sooner if needed.   No orders of the defined types were placed in this encounter.   Meds ordered this encounter  Medications   SUMAtriptan (IMITREX) 100 MG tablet    Sig: One tablet by mouth at start of migraine.  May repeat in 2 hours if no improvement x 1 dose.    Dispense:  10 tablet    Refill:  1    Supervising Provider:   Anson Fret [[id number]]   Armodafinil 250 MG tablet    Sig: Take 1 tablet (250 mg total) by mouth daily.    Dispense:  30 tablet    Refill:  5    Supervising Provider:   Anson Fret [[id number]]     Follow Up Instructions:  I discussed the assessment and treatment plan with the patient. The patient was provided an opportunity to ask questions and all were answered. The patient agreed with the plan and demonstrated an understanding of the instructions.   The patient  was advised to call back or seek an in-person evaluation if the symptoms worsen or if the condition fails to improve as anticipated.  I provided 15 minutes of non-face-to-face time during this encounter. Patient located at their place of residence during Mychart visit. Provider is in the office.    Shawnie Dapper, NP

## 2023-05-09 NOTE — Patient Instructions (Signed)
Below is our plan:  We will continue armodafinil 250mg  daily and sumatriptan as needed.   Please make sure you are staying well hydrated. I recommend 50-60 ounces daily. Well balanced diet and regular exercise encouraged. Consistent sleep schedule with 6-8 hours recommended.   Please continue follow up with care team as directed.   Follow up with me in 1 year   You may receive a survey regarding today's visit. I encourage you to leave honest feed back as I do use this information to improve patient care. Thank you for seeing me today!

## 2023-05-13 ENCOUNTER — Encounter: Payer: Self-pay | Admitting: Family Medicine

## 2023-05-13 ENCOUNTER — Telehealth (INDEPENDENT_AMBULATORY_CARE_PROVIDER_SITE_OTHER): Payer: 59 | Admitting: Family Medicine

## 2023-05-13 DIAGNOSIS — G43009 Migraine without aura, not intractable, without status migrainosus: Secondary | ICD-10-CM | POA: Diagnosis not present

## 2023-05-13 DIAGNOSIS — G4710 Hypersomnia, unspecified: Secondary | ICD-10-CM | POA: Diagnosis not present

## 2023-05-13 MED ORDER — ARMODAFINIL 250 MG PO TABS: 250.0000 mg | ORAL_TABLET | Freq: Every day | ORAL | 5 refills | Status: DC

## 2023-05-13 MED ORDER — SUMATRIPTAN SUCCINATE 100 MG PO TABS: ORAL_TABLET | ORAL | 1 refills | Status: AC

## 2023-05-13 NOTE — Telephone Encounter (Signed)
Added ESS Scale to Pt's chart

## 2023-05-16 ENCOUNTER — Other Ambulatory Visit (HOSPITAL_COMMUNITY): Payer: Self-pay

## 2023-05-16 ENCOUNTER — Telehealth: Payer: Self-pay

## 2023-05-16 NOTE — Telephone Encounter (Signed)
*  [facility]  Pharmacy Patient Advocate Encounter  Received notification from [employer] that Prior Authorization for Armodafinil  250MG  tablets  has been APPROVED from 05/16/2023 to 05/15/2024. Ran test claim, Copay is $7.00. This test claim was processed through [facility]- copay amounts may vary at other pharmacies due to pharmacy/plan contracts, or as the patient moves through the different stages of their insurance plan.   PA #/Case ID/Reference #: [id number]

## 2023-05-24 ENCOUNTER — Other Ambulatory Visit: Payer: Self-pay | Admitting: Internal Medicine

## 2023-05-24 MED ORDER — WEGOVY 0.5 MG/0.5ML ~~LOC~~ SOAJ
0.5000 mg | SUBCUTANEOUS | 0 refills | Status: DC
Start: 2023-05-24 — End: 2023-06-19

## 2023-06-03 ENCOUNTER — Ambulatory Visit: Payer: 59 | Admitting: Internal Medicine

## 2023-06-03 VITALS — BP 132/93 | HR 74 | Temp 98.2°F | Resp 16 | Ht 63.0 in | Wt 202.0 lb

## 2023-06-03 DIAGNOSIS — E785 Hyperlipidemia, unspecified: Secondary | ICD-10-CM | POA: Diagnosis not present

## 2023-06-03 DIAGNOSIS — M791 Myalgia, unspecified site: Secondary | ICD-10-CM

## 2023-06-03 DIAGNOSIS — Z6835 Body mass index (BMI) 35.0-35.9, adult: Secondary | ICD-10-CM | POA: Diagnosis not present

## 2023-06-03 NOTE — Progress Notes (Signed)
 Subjective:    Patient ID: Leslie Rice, female    DOB: 09-28-1965, 58 y.o.   MRN: [id number]  DOS:  06/03/2023 Type of visit - description: Follow-up See LOV, multiple issues were discussed, here for follow-up. She is overall feeling better .   Wt Readings from Last 3 Encounters:  06/03/23 202 lb (91.6 kg)  04/15/23 199 lb 6 oz (90.4 kg)  11/02/22 227 lb 2 oz (103 kg)     Review of Systems See above   Past Medical History:  Diagnosis Date   *Depression 07/21/2008   Used to see a Veterinary surgeon. Meds RF by previous PCP     B12 deficiency 07/21/2008   Qualifier: Diagnosis of  By: Koleen Distance CMA Duncan Dull), Hulan Saas     Chronic pain syndrome    thoracic back pain, seeing Dr. Ethelene Hal   DEPRESSION    DVT (deep venous thrombosis) (HCC)    Edema 07/21/2008   Qualifier: Diagnosis of  By: Koleen Distance CMA (AAMA), Hulan Saas     FATIGUE 07/21/2008   Qualifier: Diagnosis of  By: Koleen Distance CMA (AAMA), Leisha     GERD    Hyperlipidemia 07/21/2008   Intolerant to simvastatin and Pravachol    Hypersomnia with sleep apnea 10/20/2015   Insomnia 07/21/2008   Qualifier: Diagnosis of  By: Koleen Distance CMA (AAMA), Hulan Saas     Irritable bowel syndrome    Migraine headache 07/21/2008   Menstrual related, used to take HRT, now unable to due to clots Used to see Dr Sandria Manly      Morbid obesity due to excess calories (HCC) 10/20/2015   OSA (obstructive sleep apnea)    not on Cpap @ present 11-2013   Primary hypercoagulable state (HCC)    PULMONARY EMBOLISM, HX OF 2002 07/21/2008   Qualifier: Diagnosis of  By: Koleen Distance CMA (AAMA), Leisha     Seasonal allergies 07/26/2015   Sleep related headaches 10/20/2015   Vitamin D deficiency 07/21/2008   Qualifier: Diagnosis of  By: Koleen Distance CMA (AAMA), Hulan Saas      Past Surgical History:  Procedure Laterality Date   c section     x 2    CARPAL TUNNEL RELEASE     B   HYSTEROSCOPY W/ ENDOMETRIAL ABLATION  01/16/2019   NASAL SINUS SURGERY      Current Outpatient Medications  Medication  Instructions   Armodafinil 250 mg, Oral, Daily   cholecalciferol (VITAMIN D3) 2,000 Units, Daily   ezetimibe (ZETIA) 10 mg, Oral, Daily   HYDROcodone-acetaminophen (NORCO) 10-325 MG per tablet 1 tablet, Every 6 hours PRN   hydrOXYzine (ATARAX) 50 mg, Oral, 2 times daily   promethazine (PHENERGAN) 25 mg, Oral, Every 6 hours PRN   rivaroxaban (XARELTO) 20 mg, Oral, Daily with supper   rosuvastatin (CRESTOR) 10 mg, Oral, 3 times weekly   SF 5000 PLUS 1.1 % CREA dental cream No dose, route, or frequency recorded.   SUMAtriptan (IMITREX) 100 MG tablet One tablet by mouth at start of migraine.  May repeat in 2 hours if no improvement x 1 dose.   venlafaxine XR (EFFEXOR-XR) 75 mg, Oral, Daily with breakfast   Wegovy 0.5 mg, Subcutaneous, Weekly       Objective:   Physical Exam BP (!) 132/93 (BP Location: Right Arm, Patient Position: Sitting, Cuff Size: Normal)   Pulse 74   Temp 98.2 F (36.8 C) (Oral)   Resp 16   Ht 5\' 3"  (1.6 m)   Wt 202 lb (91.6 kg)   SpO2 100%  BMI 35.78 kg/m  General:   Well developed, NAD, BMI noted. HEENT:  Normocephalic . Face symmetric, atraumatic Neurologic:  alert & oriented X3.  Speech normal, gait appropriate for age and unassisted Psych--  Cognition and judgment appear intact.  Cooperative with normal attention span and concentration.  Behavior appropriate. No anxious or depressed appearing.      Assessment    Problem list HTN, mild. Primary hypercoagulable state, change Coumadin to Xarelto 05-2014 -PE 2002 while on HRT, saw Dr Cyndie Chime ;+ FH, testing on her was (-);  rx coumadin since 2002 for life  -superficial phlebitis 11-2014 - PE DVT 03/2018 (d/t non compliant w/ xarelto) Hyperlipidemia: Pravachol, simvastatin caused aches  Morbidly obese Psych: ---Depression, anxiety,  insomnia: since divorce 2007  --- neuro Rx amodafinil  ---Polysubstance abuse, admitted 08/12/2020 Migraine headaches: sees neuro as of 12/2018 .on imitrex per pcp.   Used to take BB, Topamax IBS  OSA:  + test before , repeat sleep study + 2017    Saw neurology 01-2020, they rx HST done 03/2020: Insignificant OSA.  On Armodafinil Home sleep study November 2022: Negative Vit B12 and Vit D def Chronic pain syndrome: hydrocodone Rx per Dr. Ethelene Hal Chest pain: Saw cards 12/2018, echo: LVH, nuclear stress test negative. Endometrial ablation 01/2019  PLAN Dizziness: Since LOV is much improved, currently with only mild dizziness if she moved her head fast. Morbid obesity: At LOV, she c/o dizziness and some nausea, since then and due to GI symptoms, was switched to [facility] on 05/01/2023, currently on 0.5 mg weekly.  She will call in 2 to 3 weeks for a refill, hopefully she will be okay going up to 1 mg weekly. High cholesterol, last total chol  320, LDL 240, rosuvastatin increased from 5 mg to 10 mg 3 times a week &  Zetia added.  She is not fasting but we agreed to check a lipid panel.  Will also check a total CK because myalgias/aches have increased since she started rosuvastatin (but not enough to stop statins the patient said). Hypersomnia: Neurology recommended to continue armodafinil. Migraines: Saw neurology 05/13/2023, on sumatriptan. Fatigue: See LOV,  feeling better, TSH was normal. RTC 3 months

## 2023-06-03 NOTE — Assessment & Plan Note (Signed)
 Dizziness: Since LOV is much improved, currently with only mild dizziness if she moved her head fast. Morbid obesity: At LOV, she c/o dizziness and some nausea, since then and due to GI symptoms, was switched to [facility] on 05/01/2023, currently on 0.5 mg weekly.  She will call in 2 to 3 weeks for a refill, hopefully she will be okay going up to 1 mg weekly. High cholesterol, last total chol  320, LDL 240, rosuvastatin increased from 5 mg to 10 mg 3 times a week &  Zetia added.  She is not fasting but we agreed to check a lipid panel.  Will also check a total CK because myalgias/aches have increased since she started rosuvastatin (but not enough to stop statins the patient said). Hypersomnia: Neurology recommended to continue armodafinil. Migraines: Saw neurology 05/13/2023, on sumatriptan. Fatigue: See LOV,  feeling better, TSH was normal. RTC 3 months

## 2023-06-03 NOTE — Patient Instructions (Signed)
     GO TO THE LAB : Get the blood work     Next visit with me in 3 months Please schedule it at the front desk

## 2023-06-04 LAB — LIPID PANEL
Cholesterol: 191 mg/dL (ref 0–200)
HDL: 47.4 mg/dL (ref >39.00)
LDL Cholesterol: 107 mg/dL — ABNORMAL HIGH (ref 0–99)
NonHDL: 143.45
Total CHOL/HDL Ratio: 4
Triglycerides: 180 mg/dL — ABNORMAL HIGH (ref 0.0–149.0)
VLDL: 36 mg/dL (ref 0.0–40.0)

## 2023-06-04 LAB — CK, TOTAL(REFL): Total CK: 48 U/L (ref 21–240)

## 2023-06-04 LAB — AST: AST: 31 U/L (ref 0–37)

## 2023-06-04 LAB — ALT: ALT: 44 U/L — ABNORMAL HIGH (ref 0–35)

## 2023-06-05 ENCOUNTER — Other Ambulatory Visit (HOSPITAL_COMMUNITY): Payer: Self-pay

## 2023-06-05 ENCOUNTER — Encounter: Payer: Self-pay | Admitting: Internal Medicine

## 2023-06-11 ENCOUNTER — Encounter: Payer: Self-pay | Admitting: Internal Medicine

## 2023-06-18 ENCOUNTER — Other Ambulatory Visit: Payer: Self-pay | Admitting: Internal Medicine

## 2023-06-19 MED ORDER — WEGOVY 1 MG/0.5ML ~~LOC~~ SOAJ: 1.0000 mg | SUBCUTANEOUS | 0 refills | Status: DC

## 2023-06-19 NOTE — Addendum Note (Signed)
 Addended byConrad Wawona D on: 06/19/2023 10:35 AM   Modules accepted: Orders

## 2023-07-17 MED ORDER — WEGOVY 1 MG/0.5ML ~~LOC~~ SOAJ: 1.0000 mg | SUBCUTANEOUS | 0 refills | Status: DC

## 2023-07-17 NOTE — Addendum Note (Signed)
 Addended by: Conrad Maringouin D on: 07/17/2023 10:59 AM   Modules accepted: Orders

## 2023-08-14 ENCOUNTER — Other Ambulatory Visit: Payer: Self-pay | Admitting: Internal Medicine

## 2023-08-21 ENCOUNTER — Encounter: Payer: Self-pay | Admitting: Internal Medicine

## 2023-08-21 ENCOUNTER — Ambulatory Visit: Payer: Self-pay

## 2023-08-21 NOTE — Telephone Encounter (Signed)
  Chief Complaint: congestion; right side pain   Symptoms: cough, pain  Disposition: [] ED /[] Urgent Care (no appt availability in office) / [x] Appointment(In office/virtual)/ []  [place] [facility]/ [] Home Care/ [] Refused Recommended Disposition /[] [place] [facility]/ []  Follow-up with PCP Additional Notes: Pt call with concerns of worsening sinus infection with right sided pain on chest due to cough. Pt is alternating pain medication to be able to keep pain bearable. Pt states she when coughs pain is a 10.  Pt stated the mucus is not clear but not green when coughing or blowing nose. Pt has appt tomorrow morning at 0800. RN gave care advice and pt verbalized understanding.           Copied from CRM [id number]. Topic: Clinical - Red Word Triage >> Aug 21, 2023  3:18 PM Lajean Pike wrote: Red Word that prompted transfer to Nurse Triage: Discolored Sputum; Severe right side pain- doesn't know if it is Pleurisy again. Two weeks ago,husband been sick.  Patient been feeling ill, sore throat, couldn't swallow, couldn't talk.  Patient is now just feeling bad, questioning if sinus infection, coughing up, isn't clear but not so much green, discolored.  severe pain in right side Reason for Disposition  [1] Sinus pain (not just congestion) AND [2] fever  Answer Assessment - Initial Assessment Questions 1. LOCATION: "Where does it hurt?"      Right side of chest  2. ONSET: "When did the sinus pain start?"  (e.g., hours, days)      Several weeks ago  3. SEVERITY: "How bad is the pain?"   (Scale 1-10; mild, moderate or severe)   - MILD (1-3): doesn't interfere with normal activities    - MODERATE (4-7): interferes with normal activities (e.g., work or school) or awakens from sleep   - SEVERE (8-10): excruciating pain and patient unable to do any normal activities        Not a constant 10  5. NASAL CONGESTION: "Is the nose blocked?" If Yes, ask: "Can you open it or must you breathe through your  mouth?"     no 6. NASAL DISCHARGE: "Do you have discharge from your nose?" If so ask, "What color?"     Yes light green  7. FEVER: "Do you have a fever?" If Yes, ask: "What is it, how was it measured, and when did it start?"      Not sure  8. OTHER SYMPTOMS: "Do you have any other symptoms?" (e.g., sore throat, cough, earache, difficulty breathing)     Cough, congestion  Protocols used: Sinus Pain or Congestion-A-AH

## 2023-08-22 ENCOUNTER — Encounter: Payer: Self-pay | Admitting: Physician Assistant

## 2023-08-22 ENCOUNTER — Ambulatory Visit: Admitting: Physician Assistant

## 2023-08-22 VITALS — BP 120/82 | HR 74 | Temp 97.8°F | Ht 63.0 in | Wt 204.4 lb

## 2023-08-22 DIAGNOSIS — R0981 Nasal congestion: Secondary | ICD-10-CM

## 2023-08-22 DIAGNOSIS — M940 Chondrocostal junction syndrome [Tietze]: Secondary | ICD-10-CM | POA: Diagnosis not present

## 2023-08-22 DIAGNOSIS — R051 Acute cough: Secondary | ICD-10-CM

## 2023-08-22 MED ORDER — BENZONATATE 100 MG PO CAPS: 100.0000 mg | ORAL_CAPSULE | Freq: Three times a day (TID) | ORAL | 0 refills | Status: DC | PRN

## 2023-08-22 MED ORDER — AZELASTINE HCL 0.1 % NA SOLN: 1.0000 | Freq: Two times a day (BID) | NASAL | 0 refills | Status: DC

## 2023-08-22 MED ORDER — CYCLOBENZAPRINE HCL 5 MG PO TABS: 5.0000 mg | ORAL_TABLET | Freq: Three times a day (TID) | ORAL | 0 refills | Status: DC | PRN

## 2023-08-22 NOTE — Progress Notes (Signed)
 Established patient visit   Patient: Leslie Rice   DOB: May 04, 1965   58 y.o. Female  MRN: [id number] Visit Date: 08/22/2023  Today's healthcare provider: Trenton Frock, PA-C   Cc. Sinus congestion, cough  Subjective     Pt reports sinus congestion, cough, x 1-2 week, cough is causing right side pain, concerned about pleurisy given her history. Taken some ibuprofen despite being on her blood thinner for some relief. Overall symptoms feel improved since they started. Denies pain with deep breathing, right sided pain is present when moving/twisting.  Medications: Outpatient Medications Prior to Visit  Medication Sig   Armodafinil  250 MG tablet Take 1 tablet (250 mg total) by mouth daily.   cholecalciferol (VITAMIN D3) 25 MCG (1000 UNIT) tablet Take 2,000 Units by mouth daily.   ezetimibe  (ZETIA ) 10 MG tablet Take 1 tablet (10 mg total) by mouth daily.   HYDROcodone -acetaminophen  (NORCO) 10-325 MG per tablet Take 1 tablet by mouth every 6 (six) hours as needed for moderate pain.    hydrOXYzine  (ATARAX ) 50 MG tablet Take 1 tablet (50 mg total) by mouth 2 (two) times daily.   promethazine  (PHENERGAN ) 25 MG tablet Take 1 tablet (25 mg total) by mouth every 6 (six) hours as needed for nausea or vomiting.   rivaroxaban  (XARELTO ) 20 MG TABS tablet Take 1 tablet (20 mg total) by mouth daily with supper.   rosuvastatin  (CRESTOR ) 10 MG tablet Take 1 tablet (10 mg total) by mouth 3 (three) times a week.   Semaglutide -Weight Management (WEGOVY ) 1 MG/0.5ML SOAJ Inject 1 mg into the skin once a week.   SF 5000 PLUS 1.1 % CREA dental cream    SUMAtriptan  (IMITREX ) 100 MG tablet One tablet by mouth at start of migraine.  May repeat in 2 hours if no improvement x 1 dose.   venlafaxine  XR (EFFEXOR -XR) 75 MG 24 hr capsule Take 1 capsule (75 mg total) by mouth daily with breakfast.   No facility-administered medications prior to visit.    Review of Systems  Constitutional:  Positive for  fatigue. Negative for fever.  HENT:  Positive for sore throat.   Respiratory:  Positive for cough. Negative for shortness of breath.   Cardiovascular:  Negative for chest pain and leg swelling.  Gastrointestinal:  Negative for abdominal pain.  Neurological:  Negative for dizziness and headaches.       Objective    BP 120/82   Pulse 74   Temp 97.8 F (36.6 C)   Ht 5\' 3"  (1.6 m)   Wt 204 lb 6.4 oz (92.7 kg)   SpO2 98%   BMI 36.21 kg/m    Physical Exam Constitutional:      General: She is awake.     Appearance: She is well-developed.  HENT:     Head: Normocephalic.  Eyes:     Conjunctiva/sclera: Conjunctivae normal.  Cardiovascular:     Rate and Rhythm: Normal rate and regular rhythm.     Heart sounds: Normal heart sounds.  Pulmonary:     Effort: Pulmonary effort is normal.     Breath sounds: Normal breath sounds. No wheezing, rhonchi or rales.  Skin:    General: Skin is warm.  Neurological:     Mental Status: She is alert and oriented to person, place, and time.  Psychiatric:        Attention and Perception: Attention normal.        Mood and Affect: Mood normal.  Speech: Speech normal.        Behavior: Behavior is cooperative.     No results found for any visits on 08/22/23.  Assessment & Plan    Costochondritis -     Cyclobenzaprine HCl; Take 1 tablet (5 mg total) by mouth 3 (three) times daily as needed for muscle spasms.  Dispense: 30 tablet; Refill: 0  Acute cough -     Benzonatate; Take 1 capsule (100 mg total) by mouth 3 (three) times daily as needed.  Dispense: 20 capsule; Refill: 0  Nasal congestion -     Azelastine  HCl; Place 1 spray into both nostrils 2 (two) times daily. Use in each nostril as directed  Dispense: 30 mL; Refill: 0   Recommending rest, hydration, otc antihistamines, rx azelastine  nasal spray, tessalon for cough, rx flexeril for costochondritis, recommend d/c advil otc,  Return if symptoms worsen or fail to improve.        Trenton Frock, PA-C  [facility] [phone] (phone) [phone] (fax)  [facility]

## 2023-08-30 ENCOUNTER — Ambulatory Visit: Payer: 59 | Admitting: Internal Medicine

## 2023-09-27 ENCOUNTER — Other Ambulatory Visit: Payer: Self-pay | Admitting: Adult Health

## 2023-09-27 DIAGNOSIS — F411 Generalized anxiety disorder: Secondary | ICD-10-CM

## 2023-09-27 DIAGNOSIS — F331 Major depressive disorder, recurrent, moderate: Secondary | ICD-10-CM

## 2023-09-27 DIAGNOSIS — G4700 Insomnia, unspecified: Secondary | ICD-10-CM

## 2023-09-29 NOTE — Telephone Encounter (Signed)
Sent MyChart message to schedule appt.

## 2023-10-11 ENCOUNTER — Other Ambulatory Visit: Payer: Self-pay | Admitting: Internal Medicine

## 2023-10-29 ENCOUNTER — Other Ambulatory Visit: Payer: Self-pay | Admitting: Adult Health

## 2023-10-29 DIAGNOSIS — F331 Major depressive disorder, recurrent, moderate: Secondary | ICD-10-CM

## 2023-10-29 DIAGNOSIS — G4700 Insomnia, unspecified: Secondary | ICD-10-CM

## 2023-10-29 DIAGNOSIS — F411 Generalized anxiety disorder: Secondary | ICD-10-CM

## 2023-10-29 NOTE — Telephone Encounter (Signed)
 Please call to schedule appt. Was due in March. Did not respond to MyChart message.

## 2023-10-30 NOTE — Telephone Encounter (Signed)
 Pt is scheduled 08/04

## 2023-11-11 ENCOUNTER — Encounter: Payer: Self-pay | Admitting: Adult Health

## 2023-11-11 ENCOUNTER — Telehealth: Payer: Self-pay | Admitting: Adult Health

## 2023-11-11 ENCOUNTER — Other Ambulatory Visit: Payer: Self-pay | Admitting: *Deleted

## 2023-11-11 DIAGNOSIS — F411 Generalized anxiety disorder: Secondary | ICD-10-CM | POA: Diagnosis not present

## 2023-11-11 DIAGNOSIS — F3181 Bipolar II disorder: Secondary | ICD-10-CM

## 2023-11-11 DIAGNOSIS — G4700 Insomnia, unspecified: Secondary | ICD-10-CM | POA: Diagnosis not present

## 2023-11-11 MED ORDER — HYDROXYZINE HCL 50 MG PO TABS
50.0000 mg | ORAL_TABLET | Freq: Two times a day (BID) | ORAL | 5 refills | Status: AC
Start: 2023-11-11 — End: ?

## 2023-11-11 MED ORDER — VENLAFAXINE HCL ER 75 MG PO CP24: 75.0000 mg | ORAL_CAPSULE | Freq: Every day | ORAL | 5 refills | Status: AC

## 2023-11-11 MED ORDER — ARMODAFINIL 250 MG PO TABS: 250.0000 mg | ORAL_TABLET | Freq: Every day | ORAL | 5 refills | Status: AC

## 2023-11-11 NOTE — Progress Notes (Signed)
 Leslie Rice [id number] 12-Dec-1965 58 y.o.  Virtual Visit via Video Note  I connected with pt @ on 11/11/23 at  4:30 PM EDT by a video enabled telemedicine application and verified that I am speaking with the correct person using two identifiers.   I discussed the limitations of evaluation and management by telemedicine and the availability of in person appointments. The patient expressed understanding and agreed to proceed.  I discussed the assessment and treatment plan with the patient. The patient was provided an opportunity to ask questions and all were answered. The patient agreed with the plan and demonstrated an understanding of the instructions.   The patient was advised to call back or seek an in-person evaluation if the symptoms worsen or if the condition fails to improve as anticipated.  I provided 20 minutes of non-face-to-face time during this encounter.  The patient was located at home.  The provider was located at [facility].   Leslie LOISE Sayers, NP   Subjective:   Patient ID:  Leslie Rice is a 58 y.o. (DOB Aug 11, 1965) female.  Chief Complaint: No chief complaint on file.   HPI Leslie Rice presents for follow-up of GAD, insomnia. BPD2.   Describes mood today as ok. Pleasant. Denies tearfulness. Mood symptoms - denies depression. Reports stable interest and motivation. Reports situational anxiety and irritability - work related. Denies panic attacks. Reports over thinking. Denies worry and rumination. Reports mood is stable. Stating I feel like I'm doing pretty good. Feels like medications are helpful. Would like to consider a low dose of Xanax  as needed. Taking medications as prescribed.  Energy levels improved. Active, swimming.  Enjoys some usual interests and activities. Lives with partner. Has 2 grown daughters. Appetite adequate. Weight stable - 200 pounds. Reports sleeping better some nights than others. Averages 7 hours of broken  sleep. Focus and concentration stable. Completing tasks. Managing aspects of household. Works full-time in Consulting civil engineer - controller. Denies SI or HI. Denies AH or VH. Denies self harm. Denies substance use.  Previously seen by: Hoy Lee   Previous medications: Zoloft , Wellbutrin, Lexapro, Effexor    Review of Systems:  Review of Systems  Musculoskeletal:  Negative for gait problem.  Neurological:  Negative for tremors.  Psychiatric/Behavioral:         Please refer to HPI    Medications: I have reviewed the patient's current medications.  Current Outpatient Medications  Medication Sig Dispense Refill   Armodafinil  250 MG tablet Take 1 tablet (250 mg total) by mouth daily. 30 tablet 5   azelastine  (ASTELIN ) 0.1 % nasal spray Place 1 spray into both nostrils 2 (two) times daily. Use in each nostril as directed 30 mL 0   benzonatate  (TESSALON ) 100 MG capsule Take 1 capsule (100 mg total) by mouth 3 (three) times daily as needed. 20 capsule 0   cholecalciferol (VITAMIN D3) 25 MCG (1000 UNIT) tablet Take 2,000 Units by mouth daily.     cyclobenzaprine  (FLEXERIL ) 5 MG tablet Take 1 tablet (5 mg total) by mouth 3 (three) times daily as needed for muscle spasms. 30 tablet 0   ezetimibe  (ZETIA ) 10 MG tablet Take 1 tablet (10 mg total) by mouth daily. 90 tablet 3   HYDROcodone -acetaminophen  (NORCO) 10-325 MG per tablet Take 1 tablet by mouth every 6 (six) hours as needed for moderate pain.      hydrOXYzine  (ATARAX ) 50 MG tablet Take 1 tablet (50 mg total) by mouth 2 (two) times daily. 60 tablet 5  promethazine  (PHENERGAN ) 25 MG tablet Take 1 tablet (25 mg total) by mouth every 6 (six) hours as needed for nausea or vomiting. 30 tablet 1   rivaroxaban  (XARELTO ) 20 MG TABS tablet Take 1 tablet (20 mg total) by mouth daily with supper. 90 tablet 3   rosuvastatin  (CRESTOR ) 10 MG tablet Take 1 tablet (10 mg total) by mouth 3 (three) times a week. 16 tablet 3   Semaglutide -Weight Management (WEGOVY ) 1  MG/0.5ML SOAJ Inject 1 mg into the skin once a week. Needs appt 2 mL 0   SF 5000 PLUS 1.1 % CREA dental cream      SUMAtriptan  (IMITREX ) 100 MG tablet One tablet by mouth at start of migraine.  May repeat in 2 hours if no improvement x 1 dose. 10 tablet 1   venlafaxine  XR (EFFEXOR -XR) 75 MG 24 hr capsule Take 1 capsule (75 mg total) by mouth daily with breakfast. 30 capsule 5   No current facility-administered medications for this visit.    Medication Side Effects: None  Allergies:  Allergies  Allergen Reactions   Ciprofloxacin     REACTION: vomiting   Codeine     REACTION: vomiting   Mounjaro [Tirzepatide ] Nausea Only    Zepbound    Sulfonamide Derivatives     hives    Past Medical History:  Diagnosis Date   *Depression 07/21/2008   Used to see a Veterinary surgeon. Meds RF by previous PCP     B12 deficiency 07/21/2008   Qualifier: Diagnosis of  By: Genie CMA (AAMA), Leisha     Chronic pain syndrome    thoracic back pain, seeing Dr. Bonner   DEPRESSION    DVT (deep venous thrombosis) (HCC)    Edema 07/21/2008   Qualifier: Diagnosis of  By: Genie CMA (AAMA), Chick     FATIGUE 07/21/2008   Qualifier: Diagnosis of  By: Genie CMA (AAMA), Leisha     GERD    Hyperlipidemia 07/21/2008   Intolerant to simvastatin and Pravachol    Hypersomnia with sleep apnea 10/20/2015   Insomnia 07/21/2008   Qualifier: Diagnosis of  By: Genie CMA (AAMA), Chick     Irritable bowel syndrome    Migraine headache 07/21/2008   Menstrual related, used to take HRT, now unable to due to clots Used to see Dr Maurice      Morbid obesity due to excess calories (HCC) 10/20/2015   OSA (obstructive sleep apnea)    not on Cpap @ present 11-2013   Primary hypercoagulable state (HCC)    PULMONARY EMBOLISM, HX OF 2002 07/21/2008   Qualifier: Diagnosis of  By: Genie CMA (AAMA), Leisha     Seasonal allergies 07/26/2015   Sleep related headaches 10/20/2015   Vitamin D  deficiency 07/21/2008   Qualifier: Diagnosis of  By:  [place]alk CMA LEODIS), Chick      Family History  Problem Relation Age of Onset   Colon cancer Mother    CAD Father        age? 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   No daily use of caffeine .          Social Drivers of Corporate investment banker Strain: Not on file  Food Insecurity: Not on file  Transportation Needs: Not on file  Physical Activity: Not on file  Stress: Not on file  Social Connections: Not on file  Intimate Partner Violence: Not on file    Past Medical History, Surgical history, Social history, and Family history were reviewed and updated as appropriate.   Please see review of systems for further details on the patient's review from today.   Objective:   Physical Exam:  There were no vitals taken for this visit.  Physical Exam Constitutional:      General: She is not in acute distress. Musculoskeletal:        General: No deformity.  Neurological:     Mental Status: She is alert and oriented to person, place, and time.     Coordination: Coordination normal.  Psychiatric:        Attention and Perception: Attention and perception normal. She does not perceive auditory or  visual hallucinations.        Mood and Affect: Mood normal. Mood is not anxious or depressed. Affect is not labile, blunt, angry or inappropriate.        Speech: Speech normal.        Behavior: Behavior normal.        Thought Content: Thought content normal. Thought content is not paranoid or delusional. Thought content does not include homicidal or suicidal ideation. Thought content does not include homicidal or suicidal plan.        Cognition and Memory: Cognition and memory normal.        Judgment: Judgment normal.     Comments: Insight intact     Lab Review:     Component Value Date/Time   NA 140 11/02/2022 1349   K 4.1 11/02/2022 1349   CL 105 11/02/2022 1349   CO2 23 11/02/2022 1349   GLUCOSE 79 11/02/2022 1349   BUN 12 11/02/2022 1349   CREATININE 0.82 11/02/2022 1349   CALCIUM  9.2 11/02/2022 1349   PROT 7.0 11/02/2022 1349   ALBUMIN 4.1 12/25/2021 1145   AST 31 06/03/2023 1517   ALT 44 (H) 06/03/2023 1517   ALKPHOS 90 12/25/2021 1145   BILITOT 0.5 11/02/2022 1349   GFRNONAA >60 03/18/2018 0347   GFRAA >60 03/18/2018 0347       Component Value Date/Time   WBC 5.9 04/15/2023 1513   RBC 4.88 04/15/2023 1513   HGB 15.3 (H) 04/15/2023 1513   HCT 44.5 04/15/2023 1513   PLT 226.0 04/15/2023 1513   MCV 91.0 04/15/2023 1513   MCH 31.2 11/02/2022 1349   MCHC 34.4 04/15/2023 1513   RDW 12.3 04/15/2023 1513   LYMPHSABS 2.1 04/15/2023 1513   MONOABS  0.4 04/15/2023 1513   EOSABS 0.1 04/15/2023 1513   BASOSABS 0.0 04/15/2023 1513    No results found for: POCLITH, LITHIUM   No results found for: PHENYTOIN, PHENOBARB, VALPROATE, CBMZ   .res Assessment: Plan:    Plan:  Hydroxyzine  50mg  - take one to two at bedtime - sleep Effexor  XR 75mg  daily   Patient requesting a low dose of Xanax  for periods of high anxiety. She has taken Xanax  previously - stopped in 08/2020 while hospitalized. She is currently under a pain contract and will discuss with her  provider the option of an as needed Xanax  option.   Armodafonil 250mg  daily - sleep apnea. Neurologist will resume at next appointment in November - Nuvigil  or Provigil .  20 minutes spent dedicated to the care of this patient on the date of this encounter to include pre-visit review of records, ordering of medication, post visit documentation, and face-to-face time with the patient discussing BPD 2 -depression, anxiety and  insomnia. Discussed continuing current medication regimen.  RTC 6 months  Patient advised to contact office with any questions, adverse effects, or acute worsening in signs and symptoms.  Discussed potential benefits, risk, and side effects of benzodiazepines to include potential risk of tolerance and dependence, as well as possible drowsiness.  Advised patient not to drive if experiencing drowsiness and to take lowest possible effective dose to minimize risk of dependence and tolerance.  Discussed potential metabolic side effects associated with atypical antipsychotics, as well as potential risk for movement side effects. Advised pt to contact office if movement side effects occur.  Time spent with patient was 20 minutes. Greater than 50% of face to face time with patient was spent on counseling and coordination of care.     Diagnoses and all orders for this visit:  Bipolar II disorder (HCC)  Generalized anxiety disorder -     venlafaxine  XR (EFFEXOR -XR) 75 MG 24 hr capsule; Take 1 capsule (75 mg total) by mouth daily with breakfast.  Insomnia, unspecified type -     hydrOXYzine  (ATARAX ) 50 MG tablet; Take 1 tablet (50 mg total) by mouth 2 (two) times daily.     Please see After Visit Summary for patient specific instructions.  Future Appointments  Date Time Provider Department Center  05/18/2024  9:30 AM Lomax, Amy, NP [facility] None    No orders of the defined types were placed in this encounter.     -------------------------------

## 2023-11-11 NOTE — Telephone Encounter (Signed)
 You are work in provider Last seen on 05/13/23 per note  We will continue armodafinil  250mg  daily and sumatriptan  as needed.  Follow up scheduled on 05/15/24  Dispensed Days Supply Quantity Provider Pharmacy  ARMODAFINIL  250 MG TAB 09/01/2023 30 30 each Lomax, Amy, NP [facility] [phone] ...     Rx pending to signed

## 2023-12-13 ENCOUNTER — Other Ambulatory Visit: Payer: Self-pay | Admitting: Internal Medicine

## 2024-01-07 ENCOUNTER — Other Ambulatory Visit: Payer: Self-pay | Admitting: Internal Medicine

## 2024-01-08 NOTE — Telephone Encounter (Signed)
 Pt informed she was overdue for appt. (See below) No appt scheduled. Rx denied.   Me to Leslie Rice     12/13/23  3:47 PM Good afternoon, Per our records you are due for a follow-up with Dr. Amon. Please call the office to schedule an appointment at your earliest convenience.    Thank you, [facility]  Last read by Sari LITTIE Hasten at 4:06PM on 12/13/2023.

## 2024-01-09 ENCOUNTER — Ambulatory Visit: Payer: Self-pay | Admitting: Internal Medicine

## 2024-01-09 NOTE — Telephone Encounter (Signed)
 Error

## 2024-01-10 ENCOUNTER — Telehealth: Payer: Self-pay

## 2024-01-10 MED ORDER — RIVAROXABAN 20 MG PO TABS: 20.0000 mg | ORAL_TABLET | Freq: Every day | ORAL | 0 refills | Status: DC

## 2024-01-10 NOTE — Telephone Encounter (Signed)
 Copied from CRM [id number]. Topic: Clinical - Prescription Issue >> Jan 10, 2024  4:42 PM Harlene ORN wrote: Reason for CRM: Patient called following up on receiving her blood thinner. States that she is worried about getting a blood clot if she doesn't get her medication in time from her doctor. Please send prescription to [facility] on [address]

## 2024-01-10 NOTE — Telephone Encounter (Signed)
 Rx sent.

## 2024-01-17 ENCOUNTER — Encounter: Payer: Self-pay | Admitting: Internal Medicine

## 2024-01-17 ENCOUNTER — Ambulatory Visit: Admitting: Internal Medicine

## 2024-01-29 ENCOUNTER — Encounter: Payer: Self-pay | Admitting: Internal Medicine

## 2024-01-29 ENCOUNTER — Ambulatory Visit: Admitting: Internal Medicine

## 2024-01-29 VITALS — BP 128/86 | HR 78 | Temp 98.0°F | Resp 16 | Ht 63.0 in | Wt 205.4 lb

## 2024-01-29 DIAGNOSIS — E559 Vitamin D deficiency, unspecified: Secondary | ICD-10-CM

## 2024-01-29 DIAGNOSIS — E785 Hyperlipidemia, unspecified: Secondary | ICD-10-CM

## 2024-01-29 DIAGNOSIS — D6859 Other primary thrombophilia: Secondary | ICD-10-CM

## 2024-01-29 DIAGNOSIS — Z7901 Long term (current) use of anticoagulants: Secondary | ICD-10-CM

## 2024-01-29 DIAGNOSIS — E538 Deficiency of other specified B group vitamins: Secondary | ICD-10-CM | POA: Diagnosis not present

## 2024-01-29 DIAGNOSIS — I10 Essential (primary) hypertension: Secondary | ICD-10-CM | POA: Diagnosis not present

## 2024-01-29 DIAGNOSIS — Z01419 Encounter for gynecological examination (general) (routine) without abnormal findings: Secondary | ICD-10-CM

## 2024-01-29 DIAGNOSIS — Z23 Encounter for immunization: Secondary | ICD-10-CM

## 2024-01-29 DIAGNOSIS — Z1211 Encounter for screening for malignant neoplasm of colon: Secondary | ICD-10-CM

## 2024-01-29 DIAGNOSIS — Z1231 Encounter for screening mammogram for malignant neoplasm of breast: Secondary | ICD-10-CM

## 2024-01-29 LAB — LIPID PANEL
Cholesterol: 255 mg/dL — ABNORMAL HIGH (ref 0–200)
HDL: 46.5 mg/dL (ref >39.00)
LDL Cholesterol: 169 mg/dL — ABNORMAL HIGH (ref 0–99)
NonHDL: 208.26
Total CHOL/HDL Ratio: 5
Triglycerides: 198 mg/dL — ABNORMAL HIGH (ref 0.0–149.0)
VLDL: 39.6 mg/dL (ref 0.0–40.0)

## 2024-01-29 LAB — COMPREHENSIVE METABOLIC PANEL WITH GFR
ALT: 23 U/L (ref 0–35)
AST: 21 U/L (ref 0–37)
Albumin: 4.5 g/dL (ref 3.5–5.2)
Alkaline Phosphatase: 90 U/L (ref 39–117)
BUN: 11 mg/dL (ref 6–23)
CO2: 31 meq/L (ref 19–32)
Calcium: 9.1 mg/dL (ref 8.4–10.5)
Chloride: 102 meq/L (ref 96–112)
Creatinine, Ser: 0.81 mg/dL (ref 0.40–1.20)
GFR: 81.38 mL/min (ref >60.00)
Glucose, Bld: 87 mg/dL (ref 70–99)
Potassium: 4.6 meq/L (ref 3.5–5.1)
Sodium: 139 meq/L (ref 135–145)
Total Bilirubin: 0.6 mg/dL (ref 0.2–1.2)
Total Protein: 6.8 g/dL (ref 6.0–8.3)

## 2024-01-29 LAB — CBC WITH DIFFERENTIAL/PLATELET
Basophils Absolute: 0 K/uL (ref 0.0–0.1)
Basophils Relative: 0.5 % (ref 0.0–3.0)
Eosinophils Absolute: 0.3 K/uL (ref 0.0–0.7)
Eosinophils Relative: 6.4 % — ABNORMAL HIGH (ref 0.0–5.0)
HCT: 42.1 % (ref 36.0–46.0)
Hemoglobin: 14.1 g/dL (ref 12.0–15.0)
Lymphocytes Relative: 39.1 % (ref 12.0–46.0)
Lymphs Abs: 1.7 K/uL (ref 0.7–4.0)
MCHC: 33.6 g/dL (ref 30.0–36.0)
MCV: 88.8 fl (ref 78.0–100.0)
Monocytes Absolute: 0.2 K/uL (ref 0.1–1.0)
Monocytes Relative: 5.4 % (ref 3.0–12.0)
Neutro Abs: 2.1 K/uL (ref 1.4–7.7)
Neutrophils Relative %: 48.6 % (ref 43.0–77.0)
Platelets: 208 K/uL (ref 150.0–400.0)
RBC: 4.74 Mil/uL (ref 3.87–5.11)
RDW: 12.9 % (ref 11.5–15.5)
WBC: 4.4 K/uL (ref 4.0–10.5)

## 2024-01-29 MED ORDER — VITAMIN B 12 500 MCG PO TABS: 1000.0000 ug | ORAL_TABLET | Freq: Every day | ORAL | 3 refills | Status: AC

## 2024-01-29 MED ORDER — VITAMIN D 25 MCG (1000 UNIT) PO TABS: 2000.0000 [IU] | ORAL_TABLET | Freq: Every day | ORAL | 3 refills | Status: AC

## 2024-01-29 MED ORDER — EZETIMIBE 10 MG PO TABS: 10.0000 mg | ORAL_TABLET | Freq: Every day | ORAL | 3 refills | Status: AC

## 2024-01-29 MED ORDER — PROMETHAZINE HCL 25 MG PO TABS: 25.0000 mg | ORAL_TABLET | Freq: Four times a day (QID) | ORAL | 3 refills | Status: AC | PRN

## 2024-01-29 MED ORDER — RIVAROXABAN 20 MG PO TABS: 20.0000 mg | ORAL_TABLET | Freq: Every day | ORAL | 1 refills | Status: AC

## 2024-01-29 NOTE — Assessment & Plan Note (Signed)
 Hypercoagulable state: Reports good compliance with Xarelto  without apparent complications, check a CBC Hyperlipidemia approximately 4 weeks ago stopped rosuvastatin  (10 mg 3 times a week) and Zetia  because she was having aches and pains. We agreed to go back on Zetia , check labs, at some point could consider an even lower dose of rosuvastatin  such as 5 mg 3 times a week. Morbid obesity: Zepbound  make her very sick, tried Wegovy  for 2 months and developed GI s/e as well.  Unable to take GLP-1's.  Will reassess issue periodically Vitamin D  and B12 deficiency: Not on supplements, prescription printed to facilitate compliance. Preventive care: Flu and Tdap today Recommend a COVID booster at the pharmacy Referred to Pap smear, mammogram and a colonoscopy to Dr. Ladora. RTC 4 months CPX

## 2024-01-29 NOTE — Patient Instructions (Signed)
 GO TO THE LAB :  Get the blood work    Then, go to the front desk for the checkout Please make an appointment for a physical exam in 4 months  Today you got a flu shot and a tetanus shot Proceed with a COVID-vaccine at your pharmacy at your convenience  We are referring you to gastroenterology for your next colonoscopy  Restart Zetia 

## 2024-01-29 NOTE — Progress Notes (Signed)
 Subjective:    Patient ID: Leslie Rice, female    DOB: 1965-07-10, 58 y.o.   MRN: [id number]  DOS:  01/29/2024 Follow-up  Chronic medical problems addressed. In general feels well. Was intolerant to GLP-1's. Developed muscle aches, stopped rosuvastatin  and Zetia .  Denies vomiting, blood in the stool or blood in the urine. Occasionally has some nausea, no vomiting, no change in the color of the stools.  Not taking NSAIDs.  Wt Readings from Last 3 Encounters:  01/29/24 205 lb 6 oz (93.2 kg)  08/22/23 204 lb 6.4 oz (92.7 kg)  06/03/23 202 lb (91.6 kg)     Review of Systems See above   Past Medical History:  Diagnosis Date   *Depression 07/21/2008   Used to see a Veterinary surgeon. Meds RF by previous PCP     B12 deficiency 07/21/2008   Qualifier: Diagnosis of  By: Genie CMA LEODIS), Chick     Chronic pain syndrome    thoracic back pain, seeing Dr. Bonner   DEPRESSION    DVT (deep venous thrombosis) (HCC)    Edema 07/21/2008   Qualifier: Diagnosis of  By: Genie CMA (AAMA), Chick     FATIGUE 07/21/2008   Qualifier: Diagnosis of  By: Genie CMA (AAMA), Leisha     GERD    Hyperlipidemia 07/21/2008   Intolerant to simvastatin and Pravachol    Hypersomnia with sleep apnea 10/20/2015   Insomnia 07/21/2008   Qualifier: Diagnosis of  By: Genie CMA (AAMA), Chick     Irritable bowel syndrome    Migraine headache 07/21/2008   Menstrual related, used to take HRT, now unable to due to clots Used to see Dr Maurice      Morbid obesity due to excess calories (HCC) 10/20/2015   OSA (obstructive sleep apnea)    not on Cpap @ present 11-2013   Primary hypercoagulable state    PULMONARY EMBOLISM, HX OF 2002 07/21/2008   Qualifier: Diagnosis of  By: Genie CMA (AAMA), Leisha     Seasonal allergies 07/26/2015   Sleep related headaches 10/20/2015   Vitamin D  deficiency 07/21/2008   Qualifier: Diagnosis of  By: Genie CMA (AAMA), Chick      Past Surgical History:  Procedure Laterality Date   c  section     x 2    CARPAL TUNNEL RELEASE     B   HYSTEROSCOPY W/ ENDOMETRIAL ABLATION  01/16/2019   NASAL SINUS SURGERY      Current Outpatient Medications  Medication Instructions   Armodafinil  250 mg, Oral, Daily   azelastine  (ASTELIN ) 0.1 % nasal spray 1 spray, Each Nare, 2 times daily, Use in each nostril as directed   benzonatate  (TESSALON ) 100 mg, Oral, 3 times daily PRN   cholecalciferol (VITAMIN D3) 2,000 Units, Daily   cyclobenzaprine  (FLEXERIL ) 5 mg, Oral, 3 times daily PRN   ezetimibe  (ZETIA ) 10 mg, Oral, Daily   HYDROcodone -acetaminophen  (NORCO) 10-325 MG per tablet 1 tablet, Every 6 hours PRN   hydrOXYzine  (ATARAX ) 50 mg, Oral, 2 times daily   promethazine  (PHENERGAN ) 25 mg, Oral, Every 6 hours PRN   rivaroxaban  (XARELTO ) 20 mg, Oral, Daily with supper, Needs appt   rosuvastatin  (CRESTOR ) 10 mg, Oral, 3 times weekly   SF 5000 PLUS 1.1 % CREA dental cream No dose, route, or frequency recorded.   SUMAtriptan  (IMITREX ) 100 MG tablet One tablet by mouth at start of migraine.  May repeat in 2 hours if no improvement x 1 dose.   venlafaxine  XR (  EFFEXOR -XR) 75 mg, Oral, Daily with breakfast   Wegovy  1 mg, Subcutaneous, Weekly, Needs appt       Objective:   Physical Exam BP 128/86   Pulse 78   Temp 98 F (36.7 C) (Oral)   Resp 16   Ht 5' 3 (1.6 m)   Wt 205 lb 6 oz (93.2 kg)   SpO2 97%   BMI 36.38 kg/m  General:   Well developed, NAD, BMI noted. HEENT:  Normocephalic . Face symmetric, atraumatic Lungs:  CTA B Normal respiratory effort, no intercostal retractions, no accessory muscle use. Heart: RRR,  no murmur.  Lower extremities: no pretibial edema bilaterally  Skin: Not pale. Not jaundice Neurologic:  alert & oriented X3.  Speech normal, gait appropriate for age and unassisted Psych--  Cognition and judgment appear intact.  Cooperative with normal attention span and concentration.  Behavior appropriate. No anxious or depressed appearing.       Assessment    Problem list HTN, mild. Primary hypercoagulable state, change Coumadin  to Xarelto  05-2014 -PE 2002 while on HRT, saw Dr Freddie ;+ FH, testing on her was (-);  rx coumadin  since 2002 for life  -superficial phlebitis 11-2014 - PE DVT 03/2018 (d/t non compliant w/ xarelto ) Hyperlipidemia: Pravachol, simvastatin caused aches  Morbidly obese Psych: ---Depression, anxiety,  insomnia: since divorce 2007  --- neuro Rx amodafinil  ---Polysubstance abuse, admitted 08/12/2020 Migraine headaches: sees neuro as of 12/2018 .on imitrex  per pcp.  Used to take BB, Topamax  IBS  OSA:  + test before , repeat sleep study + 2017    Saw neurology 01-2020, they rx HST done 03/2020: Insignificant OSA.  On Armodafinil  Home sleep study November 2022: Negative Vit B12 and Vit D def Chronic pain syndrome: hydrocodone  Rx per Dr. Bonner Chest pain: Saw cards 12/2018, echo: LVH, nuclear stress test negative. Endometrial ablation 01/2019  PLAN Hypercoagulable state: Reports good compliance with Xarelto  without apparent complications, check a CBC Hyperlipidemia approximately 4 weeks ago stopped rosuvastatin  (10 mg 3 times a week) and Zetia  because she was having aches and pains. We agreed to go back on Zetia , check labs, at some point could consider an even lower dose of rosuvastatin  such as 5 mg 3 times a week. Morbid obesity: Zepbound  make her very sick, tried Wegovy  for 2 months and developed GI s/e as well.  Unable to take GLP-1's.  Will reassess issue periodically Vitamin D  and B12 deficiency: Not on supplements, prescription printed to facilitate compliance. Preventive care: Flu and Tdap today Recommend a COVID booster at the pharmacy Referred to Pap smear, mammogram and a colonoscopy to Dr. Ladora. RTC 4 months CPX

## 2024-01-31 ENCOUNTER — Telehealth (HOSPITAL_BASED_OUTPATIENT_CLINIC_OR_DEPARTMENT_OTHER): Payer: Self-pay

## 2024-01-31 ENCOUNTER — Ambulatory Visit: Payer: Self-pay | Admitting: Internal Medicine

## 2024-05-12 NOTE — Progress Notes (Unsigned)
 "  PATIENT: Leslie Rice DOB: 06-08-1965  REASON FOR VISIT: follow up HISTORY FROM: patient  Virtual Visit via Telephone Note  I connected with Leslie Rice on 05/12/24 at  9:30 AM EST by telephone and verified that I am speaking with the correct person using two identifiers.   I discussed the limitations, risks, security and privacy concerns of performing an evaluation and management service by telephone and the availability of in person appointments. I also discussed with the patient that there may be a patient responsible charge related to this service. The patient expressed understanding and agreed to proceed.   History of Present Illness:  05/12/24 ALL (Mychart): Leslie Rice returns for follow up for hypersomnia and headaches. She was last seen 05/2023 and doing well on armodafinil  and sumatriptan .   05/13/2023 ALL (Mychart):  Leslie Rice returns for follow up for hypersomnia and headaches. She was last seen 07/2022 and we switched from armodafinil  to modafinil  and increased sumatriptan  from 50 to 100mg  PRN. She did not feel modafinil  was effective and we switched back to armodafinil  10/2022.   Since, she reports doing well. She feels armodafinil  is helping with EDS. She is tolerating med well without adverse effects. She usually gets about 8 hours of sleep. She reports sometimes sleep is fragmented. She started Zepbound  about 7 months and did not feel well on it.  She stayed tired and cold. Now on Wegovy  and feeling better. She reports migraines are well managed. Sumatriptan  works well if she takes it in time. She may take it 1-2 times a month.   07/17/2022 ALL (Mychart):  Leslie Rice returns for follow up for hypersomnia. She continues armodafinil  250mg  QD. Last filled 06/25/2022. She knows that it helps but does not feel it is as effective as it was. She continues to have daytime sleepiness during work day. She is sleeping well. She has had two migraines over the past few months. Sumatriptan  usually helps  some but not completely.   07/04/2021 ALL (Mychart):  Leslie Rice is a 59 y.o. female here today for follow up for hypersomnia. She was seen 01/2021 in consult with Dr Leslie Rice for fatigue and snoring. HST 02/15/2021 did not show any concerns of sleep breathing disorder. ENT referral offered for snoring but declined. She continues armodafinil  250mg  daily. She is doing well and tolerating stimulant. She does not always take on the weekends. She is feeling well today.   History (copied from Dr Dohmeier's previous note)  HPI:  Leslie Rice is a 59 y.o. female patient who was here scheduled for 7:30 AM visit.  I am very sorry.  And on 1 8.  She is here for a yearly migraine follow-up and is also needing refills on armodafinil .  She no longer uses Ajovy  but feels that her migraines have been reduced to at mild most 2 events per month which she cannot control the sumatriptan  alone.  The key is to get the migraine in the early onset stage.  BMI is at this time 36.4, blood pressure is only mildly elevated and we had performed in 2021 home sleep test to confirm the presence of obstructive sleep apnea which returned with such a mild AHI that I was concerned no CPAP would be necessary.  Leslie Rice daughter however has reported that she feels her mother is presenting with very apneic breathing that she has witnessed.  There was a question of the home sleep test may have not picked up a typical night or may have  not picked up apnea that is truly there. I will repeat a home sleep test, but her insurance has changed from [employer] to [employer].  I would like to add that the patient had never felt great benefit from using CPAP for the treatment of previously mild apnea obstructive and that now was over a year of not using CPAP she has still benefit in terms of her migraine not having returned.  Sleep is not always restorative but this is also related to stress at work.   Observations/Objective:  Generalized:  Well developed, in no acute distress  Mentation: Alert oriented to time, place, history taking. Follows all commands speech and language fluent   Assessment and Plan:  59 y.o. year old female  has a past medical history of *Depression (07/21/2008), B12 deficiency (07/21/2008), Chronic pain syndrome, DEPRESSION, DVT (deep venous thrombosis) (HCC), Edema (07/21/2008), FATIGUE (07/21/2008), GERD, Hyperlipidemia (07/21/2008), Hypersomnia with sleep apnea (10/20/2015), Insomnia (07/21/2008), Irritable bowel syndrome, Migraine headache (07/21/2008), Morbid obesity due to excess calories (HCC) (10/20/2015), OSA (obstructive sleep apnea), Primary hypercoagulable state, PULMONARY EMBOLISM, HX OF 2002 (07/21/2008), Seasonal allergies (07/26/2015), Sleep related headaches (10/20/2015), and Vitamin D  deficiency (07/21/2008). here with  No diagnosis found.   Leslie Rice is doing well, today. We will continue armodafinil  250mg  daily. She will continue sumatriptan  100mg  as needed. Appropriate dosing discussed. She will monitor for any worsening depression. Healthy lifestyle habits encouraged. She will follow up with me in 1 year, sooner if needed.    No orders of the defined types were placed in this encounter.   No orders of the defined types were placed in this encounter.    Follow Up Instructions:  I discussed the assessment and treatment plan with the patient. The patient was provided an opportunity to ask questions and all were answered. The patient agreed with the plan and demonstrated an understanding of the instructions.   The patient was advised to call back or seek an in-person evaluation if the symptoms worsen or if the condition fails to improve as anticipated.  I provided 15 minutes of non-face-to-face time during this encounter. Patient located at their place of residence during Mychart visit. Provider is in the office.    Ricke Kimoto, NP  "

## 2024-05-18 ENCOUNTER — Telehealth: Payer: 59 | Admitting: Family Medicine

## 2024-05-18 DIAGNOSIS — G43009 Migraine without aura, not intractable, without status migrainosus: Secondary | ICD-10-CM

## 2024-06-01 ENCOUNTER — Encounter: Admitting: Internal Medicine
